# Patient Record
Sex: Female | Born: 1955 | Race: White | Hispanic: No | Marital: Single | Smoking: Never smoker
Health system: Southern US, Community
[De-identification: ages and names within clinical notes are randomized; demographics above are authoritative.]

## PROBLEM LIST (undated history)

## (undated) DIAGNOSIS — Z923 Personal history of irradiation: Secondary | ICD-10-CM

## (undated) DIAGNOSIS — T7840XA Allergy, unspecified, initial encounter: Secondary | ICD-10-CM

## (undated) DIAGNOSIS — E039 Hypothyroidism, unspecified: Secondary | ICD-10-CM

## (undated) DIAGNOSIS — T8859XA Other complications of anesthesia, initial encounter: Secondary | ICD-10-CM

## (undated) DIAGNOSIS — T4145XA Adverse effect of unspecified anesthetic, initial encounter: Secondary | ICD-10-CM

## (undated) DIAGNOSIS — N63 Unspecified lump in unspecified breast: Secondary | ICD-10-CM

## (undated) DIAGNOSIS — C50919 Malignant neoplasm of unspecified site of unspecified female breast: Secondary | ICD-10-CM

## (undated) DIAGNOSIS — E079 Disorder of thyroid, unspecified: Secondary | ICD-10-CM

## (undated) DIAGNOSIS — Z9889 Other specified postprocedural states: Secondary | ICD-10-CM

## (undated) DIAGNOSIS — F32A Depression, unspecified: Secondary | ICD-10-CM

## (undated) DIAGNOSIS — R112 Nausea with vomiting, unspecified: Secondary | ICD-10-CM

## (undated) DIAGNOSIS — F329 Major depressive disorder, single episode, unspecified: Secondary | ICD-10-CM

## (undated) DIAGNOSIS — F419 Anxiety disorder, unspecified: Secondary | ICD-10-CM

## (undated) HISTORY — DX: Depression, unspecified: F32.A

## (undated) HISTORY — DX: Allergy, unspecified, initial encounter: T78.40XA

## (undated) HISTORY — DX: Disorder of thyroid, unspecified: E07.9

## (undated) HISTORY — DX: Major depressive disorder, single episode, unspecified: F32.9

## (undated) HISTORY — PX: FRACTURE SURGERY: SHX138

---

## 1983-06-30 HISTORY — PX: OTHER SURGICAL HISTORY: SHX169

## 1998-12-03 ENCOUNTER — Other Ambulatory Visit: Admission: RE | Admit: 1998-12-03 | Discharge: 1998-12-03 | Payer: Self-pay | Admitting: Family Medicine

## 1999-04-07 ENCOUNTER — Other Ambulatory Visit: Admission: RE | Admit: 1999-04-07 | Discharge: 1999-04-07 | Payer: Self-pay | Admitting: *Deleted

## 1999-09-26 ENCOUNTER — Encounter: Payer: Self-pay | Admitting: Surgery

## 1999-09-29 ENCOUNTER — Encounter (INDEPENDENT_AMBULATORY_CARE_PROVIDER_SITE_OTHER): Payer: Self-pay

## 1999-09-29 ENCOUNTER — Observation Stay (HOSPITAL_COMMUNITY): Admission: RE | Admit: 1999-09-29 | Discharge: 1999-09-30 | Payer: Self-pay | Admitting: Surgery

## 2000-12-06 ENCOUNTER — Encounter: Payer: Self-pay | Admitting: Emergency Medicine

## 2000-12-06 ENCOUNTER — Emergency Department (HOSPITAL_COMMUNITY): Admission: EM | Admit: 2000-12-06 | Discharge: 2000-12-06 | Payer: Self-pay | Admitting: Emergency Medicine

## 2001-01-25 ENCOUNTER — Other Ambulatory Visit: Admission: RE | Admit: 2001-01-25 | Discharge: 2001-01-25 | Payer: Self-pay | Admitting: Obstetrics and Gynecology

## 2002-01-30 ENCOUNTER — Other Ambulatory Visit: Admission: RE | Admit: 2002-01-30 | Discharge: 2002-01-30 | Payer: Self-pay | Admitting: Obstetrics and Gynecology

## 2002-02-15 ENCOUNTER — Encounter: Payer: Self-pay | Admitting: Obstetrics and Gynecology

## 2002-02-15 ENCOUNTER — Ambulatory Visit (HOSPITAL_COMMUNITY): Admission: RE | Admit: 2002-02-15 | Discharge: 2002-02-15 | Payer: Self-pay | Admitting: Obstetrics and Gynecology

## 2003-03-01 ENCOUNTER — Other Ambulatory Visit: Admission: RE | Admit: 2003-03-01 | Discharge: 2003-03-01 | Payer: Self-pay | Admitting: Gynecology

## 2004-03-07 ENCOUNTER — Other Ambulatory Visit: Admission: RE | Admit: 2004-03-07 | Discharge: 2004-03-07 | Payer: Self-pay | Admitting: Gynecology

## 2005-04-22 ENCOUNTER — Other Ambulatory Visit: Admission: RE | Admit: 2005-04-22 | Discharge: 2005-04-22 | Payer: Self-pay | Admitting: Gynecology

## 2006-04-23 ENCOUNTER — Other Ambulatory Visit: Admission: RE | Admit: 2006-04-23 | Discharge: 2006-04-23 | Payer: Self-pay | Admitting: Gynecology

## 2006-11-18 ENCOUNTER — Encounter: Admission: RE | Admit: 2006-11-18 | Discharge: 2006-11-18 | Payer: Self-pay | Admitting: Family Medicine

## 2007-05-19 ENCOUNTER — Other Ambulatory Visit: Admission: RE | Admit: 2007-05-19 | Discharge: 2007-05-19 | Payer: Self-pay | Admitting: Gynecology

## 2008-10-12 ENCOUNTER — Encounter: Payer: Self-pay | Admitting: Women's Health

## 2008-10-12 ENCOUNTER — Other Ambulatory Visit: Admission: RE | Admit: 2008-10-12 | Discharge: 2008-10-12 | Payer: Self-pay | Admitting: Gynecology

## 2008-10-12 ENCOUNTER — Ambulatory Visit: Payer: Self-pay | Admitting: Women's Health

## 2009-11-08 ENCOUNTER — Ambulatory Visit: Payer: Self-pay | Admitting: Women's Health

## 2009-11-08 ENCOUNTER — Other Ambulatory Visit: Admission: RE | Admit: 2009-11-08 | Discharge: 2009-11-08 | Payer: Self-pay | Admitting: Gynecology

## 2012-07-01 ENCOUNTER — Encounter: Payer: Self-pay | Admitting: Women's Health

## 2012-07-01 ENCOUNTER — Other Ambulatory Visit (HOSPITAL_COMMUNITY)
Admission: RE | Admit: 2012-07-01 | Discharge: 2012-07-01 | Disposition: A | Discharge status: Home / Self Care | Payer: Managed Care, Other (non HMO) | Source: Ambulatory Visit | Attending: Women's Health | Admitting: Women's Health

## 2012-07-01 ENCOUNTER — Ambulatory Visit (INDEPENDENT_AMBULATORY_CARE_PROVIDER_SITE_OTHER): Payer: Managed Care, Other (non HMO) | Admitting: Women's Health

## 2012-07-01 VITALS — BP 120/70 | Ht 64.5 in | Wt 134.0 lb

## 2012-07-01 DIAGNOSIS — Z01419 Encounter for gynecological examination (general) (routine) without abnormal findings: Secondary | ICD-10-CM | POA: Insufficient documentation

## 2012-07-01 DIAGNOSIS — Z1151 Encounter for screening for human papillomavirus (HPV): Secondary | ICD-10-CM | POA: Insufficient documentation

## 2012-07-01 DIAGNOSIS — E039 Hypothyroidism, unspecified: Secondary | ICD-10-CM | POA: Insufficient documentation

## 2012-07-01 DIAGNOSIS — Z780 Asymptomatic menopausal state: Secondary | ICD-10-CM

## 2012-07-01 NOTE — Patient Instructions (Addendum)
Vit d 2000 daily  Schedule mammogram   Health Recommendations for Postmenopausal Women Based on the Results of the Women's Health Initiative [facility]) and Other Studies The WHI is a major 15-year research program to address the most common causes of death, disability and poor quality of life in postmenopausal women. Some of these causes are heart disease, cancer, bone loss (osteoporosis) and others. Taking into account all of the findings from [facility] and other studies, here are bottom-line health recommendations for women: CARDIOVASCULAR DISEASE Heart Disease: A heart attack is a medical emergency. Know the signs and symptoms of a heart attack. Hormone therapy should not be used to prevent heart disease. In women with heart disease, hormone therapy should not be used to prevent further disease. Hormone therapy increases the risk of blood clots. Below are things women can do to reduce their risk for heart disease.   Do not smoke. If you smoke, quit. Women who smoke are 2 to 6 times more likely to suffer a heart attack than non-smoking women.  Aim for a healthy weight. Being overweight causes many preventable deaths. Eat a healthy and balanced diet and drink an adequate amount of liquids.  Get moving. Make a commitment to be more physically active. Aim for 30 minutes of activity on most, if not all days of the week.  Eat for heart health. Choose a diet that is low in saturated fat, trans fat, and cholesterol. Include whole grains, vegetables, and fruits. Read the labels on the food container before buying it.  Know your numbers. Ask your caregiver to check your blood pressure, cholesterol (total, HDL, LDL, triglycerides) and blood glucose. Work with your caregiver to improve any numbers that are not normal.  High blood pressure. Limit or stop your table salt intake (try salt substitute and food seasonings), avoid salty foods and drinks. Read the labels on the food container before buying it. Avoid  becoming overweight by eating well and exercising. STROKE  Stroke is a medical emergency. Stroke can be the result of a blood clot in the blood vessel in the brain or by a brain hemorrhage (bleeding). Know the signs and symptoms of a stroke. To lower the risk of developing a stroke:  Avoid fatty foods.  Quit smoking.  Control your diabetes, blood pressure, and irregular heart rate. THROMBOPHLIBITIS (BLOOD CLOT) OF THE LEG  Hormone treatment is a big cause of developing blood clots in the leg. Becoming overweight and leading a stationary lifestyle also may contribute to developing blood clots. Controlling your diet and exercising will help lower the risk of developing blood clots. CANCER SCREENING  Breast Cancer: Women should take steps to reduce their risk of breast cancer. This includes having regular mammograms, monthly self breast exams and regular breast exams by your caregiver. Have a mammogram every one to two years if you are 79 to 59 years old. Have a mammogram annually if you are 40 years old or older depending on your risk factors. Women who are high risk for breast cancer may need more frequent mammograms. There are tests available (testing the genes in your body) if you have family history of breast cancer called BRCA 1 and 2. These tests can help determine the risks of developing breast cancer.  Intestinal or Stomach Cancer: Women should talk to their caregiver about when to start screening, what tests and how often they should be done, and the benefits and risks of doing these tests. Tests to consider are a rectal exam, fecal occult  blood, sigmoidoscopy, colononoscoby, barium enema and upper GI series of the stomach. Depending on the age, you may want to get a medical and family history of colon cancer. Women who are high risk may need to be screened at an earlier age and more often.  Cervical Cancer: A Pap test of the cervix should be done every year and every 3 years when there has  been three straight years of a normal Pap test. Women with an abnormal Pap test should be screened more often or have a cervical biopsy depending on your caregiver's recommendation.  Uterine Cancer: If you have vaginal bleeding after you are in the menopause, it should be evaluated by your caregiver.  Ovarian cancer: There are no reliable tests available to screen for ovarian cancer at this time except for yearly pelvic exams.  Lung Cancer: Yearly chest X-rays can detect lung cancer and should be done on high risk women, such as cigarette smokers and women with chronic lung disease (emphysemia).  Skin Cancer: A complete body skin exam should be done at your yearly examination. Avoid overexposure to the sun and ultraviolet light lamps. Use a strong sun block cream when in the sun. All of these things are important in lowering the risk of skin cancer. MENOPAUSE Menopause Symptoms: Hormone therapy products are effective for treating symptoms associated with menopause:  Moderate to severe hot flashes.  Night sweats.  Mood swings.  Headaches.  Tiredness.  Loss of sex drive.  Insomnia.  Other symptoms. However, hormone therapy products carry serious risks, especially in older women. Women who use or are thinking about using estrogen or estrogen with progestin treatments should discuss that with their caregiver. Your caregiver will know if the benefits outweigh the risks. The Food and Drug Administration (FDA) has concluded that hormone therapy should be used only at the lowest doses and for the shortest amount of time to reach treatment goals. It is not known at what doses there may be less risk of serious side effects. There are other treatments such as herbal medication (not controlled or regulated by the FDA), group therapy, counseling and acupuncture that may be helpful. OSTEOPOROSIS Protecting Against Bone Loss and Preventing Fracture: If hormone therapy is used for prevention of bone  loss (osteoporosis), the risks for bone loss must outweigh the risk of the therapy. Women considering taking hormone therapy for bone loss should ask their health care providers about other medications (fosamax and boniva) that are considered safe and effective for preventing bone loss and bone fractures. To guard against bone loss or fractures, it is recommended that women should take at least 1000-1500 mg of calcium and 400-800 IU of vitamin D daily in divided doses. Smoking and excessive alcohol intake increases the risk of osteoporosis. Eat foods rich in calcium and vitamin D and do weight bearing exercises several times a week as your caregiver suggests. DIABETES Diabetes Melitus: Women with Type I or Type 2 diabetes should keep their diabetes in control with diet, exercise and medication. Avoid too many sweets, starchy and fatty foods. Being overweight can affect your diabetes. COGNITION AND MEMORY Cognition and Memory: Menopausal hormone therapy is not recommended for the prevention of cognitive disorders such as Alzheimer's disease or memory loss. WHI found that women treated with hormone therapy have a greater risk of developing dementia.  DEPRESSION  Depression may occur at any age, but is common in elderly women. The reasons may be because of physical, medical, social (loneliness), financial and/or economic problems and  needs. Becoming involved with church, volunteer or social groups, seeking treatment for any physical or medical problems is recommended. Also, look into getting professional advice for any economic or financial problems. ACCIDENTS  Accidents are common and can be serious in the elderly woman. Prepare your house to prevent accidents. Eliminate throw rugs, use hip protectors, place hand bars in the bath, shower and toilet areas. Avoid wearing high heel shoes and walking on wet, snowy and icy areas. Stop driving if you have vision, hearing problems or are unsteady with you movements  and reflexes. RHEUMATOID ARTHRITIS Rheumatoid arthritis causes pain, swelling and stiffness of your bone joints. It can limit many of your activities. Over-the-counter medications may help, but prescription medications may be necessary. Talk with your caregiver about this. Exercise (walking, water aerobics), good posture, using splints on painful joints, warm baths or applying warm compresses to stiff joints and cold compresses to painful joints may be helpful. Smoking and excessive drinking may worsen the symptoms of arthritis. Seek help from a physical therapist if the arthritis is becoming a problem with your daily activities. IMMUNIZATIONS  Several immunizations are important to have during your senior years, including:   Tetanus and a diptheria shot booster every 10 years.  Influenza every year before the flu season begins.  Pneumonia vaccine.  Shingles vaccine.  Others as indicated (example: H1N1 vaccine). Document Released: 08/07/2005 Document Revised: 09/07/2011 Document Reviewed: 04/02/2008 [facility] Patient Information 2013 [place], [place].

## 2012-07-01 NOTE — Addendum Note (Signed)
Addended by: Richardson Chiquito on: 07/01/2012 05:19 PM   Modules accepted: Orders

## 2012-07-01 NOTE — Progress Notes (Signed)
LOLITHA TORTORA [birth year] [id number]    History:    The patient presents for annual exam.  Amenorrheic x1 year. Had regular monthly cycles prior. Minimal hot flushes, had hot flushes 6 months prior to cycle stopping. Not sexually active for years. History of normal Paps. Last mammogram 2003, no colonoscopy due to cost. Hypothyroid on Levoxyl per primary care. History of cryo- in 1979, normal Paps after.  Past medical history, past surgical history, family history and social history were all reviewed and documented in the EPIC chart. 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   ROS:  A  ROS was performed and pertinent positives and negatives are included in the history.  Exam:  Filed Vitals:   07/01/12 1622  BP: 120/70    General appearance:  Normal Head/Neck:  Normal, without cervical or supraclavicular adenopathy. Thyroid:  Symmetrical, normal in size, without palpable masses or nodularity. Respiratory  Effort:  Normal  Auscultation:  Clear without wheezing or rhonchi Cardiovascular  Auscultation:  Regular rate, without rubs, murmurs or gallops  Edema/varicosities:  Not grossly evident Abdominal  Soft,nontender, without masses, guarding or rebound.  Liver/spleen:  No organomegaly noted  Hernia:  None appreciated  Skin  Inspection:  Grossly normal  Palpation:  Grossly normal Neurologic/psychiatric  Orientation:  Normal with appropriate conversation.  Mood/affect:  Normal  Genitourinary    Breasts: Examined lying and sitting.     Right: Without masses, retractions, discharge or axillary adenopathy.     Left: Without masses, retractions, discharge or axillary adenopathy.   Inguinal/mons:  Normal without inguinal adenopathy  External genitalia:  Normal  BUS/Urethra/Skene's glands:  Normal  Bladder:  Normal  Vagina:  Normal  Cervix:  Normal  Uterus:   normal in size, shape and contour.  Midline and mobile  Adnexa/parametria:     Rt: Without masses or  tenderness.   Lt: Without masses or tenderness.  Anus and perineum: Normal  Digital rectal exam: Normal sphincter tone without palpated masses or tenderness  Assessment/Plan:  57 y.o. S. WF G1 P0 for annual exam with no complaints.  Normal postmenopausal exam Hypothyroid labs and meds primary care  Plan: Pap, last normal Pap 2011, new screening guidelines reviewed. Instructed to call if any further bleeding. Denies need for HRT. Reviewed importance of scheduling annual mammogram, breast center number given. SBE's, continue regular exercise, calcium rich diet, vitamin D 2000 daily encouraged. DEXA, instructed to schedule. Home Hemoccult card given with instructions. Encouraged to check insurance coverage annually, which may change for colonoscopy.    Harrington Challenger [facility], 4:52 PM 07/01/2012

## 2013-07-03 ENCOUNTER — Ambulatory Visit (INDEPENDENT_AMBULATORY_CARE_PROVIDER_SITE_OTHER): Payer: PRIVATE HEALTH INSURANCE | Admitting: Internal Medicine

## 2013-07-03 ENCOUNTER — Other Ambulatory Visit: Payer: Self-pay | Admitting: Internal Medicine

## 2013-07-03 ENCOUNTER — Ambulatory Visit: Payer: PRIVATE HEALTH INSURANCE

## 2013-07-03 VITALS — BP 110/66 | HR 100 | Temp 97.8°F | Resp 18 | Ht 65.0 in | Wt 129.0 lb

## 2013-07-03 DIAGNOSIS — L03019 Cellulitis of unspecified finger: Secondary | ICD-10-CM

## 2013-07-03 DIAGNOSIS — J040 Acute laryngitis: Secondary | ICD-10-CM

## 2013-07-03 DIAGNOSIS — R059 Cough, unspecified: Secondary | ICD-10-CM

## 2013-07-03 DIAGNOSIS — R05 Cough: Secondary | ICD-10-CM

## 2013-07-03 DIAGNOSIS — R5383 Other fatigue: Secondary | ICD-10-CM

## 2013-07-03 DIAGNOSIS — L02519 Cutaneous abscess of unspecified hand: Secondary | ICD-10-CM

## 2013-07-03 DIAGNOSIS — R5381 Other malaise: Secondary | ICD-10-CM

## 2013-07-03 DIAGNOSIS — M7989 Other specified soft tissue disorders: Secondary | ICD-10-CM

## 2013-07-03 LAB — POCT CBC
GRANULOCYTE PERCENT: 51 % (ref 37–80)
HEMATOCRIT: 41.1 % (ref 37.7–47.9)
HEMOGLOBIN: 12.7 g/dL (ref 12.2–16.2)
Lymph, poc: 3 (ref 0.6–3.4)
MCH: 30.2 pg (ref 27–31.2)
MCHC: 30.9 g/dL — AB (ref 31.8–35.4)
MCV: 97.9 fL — AB (ref 80–97)
MID (cbc): 0.5 (ref 0–0.9)
MPV: 8 fL (ref 0–99.8)
PLATELET COUNT, POC: 286 10*3/uL (ref 142–424)
POC Granulocyte: 3.7 (ref 2–6.9)
POC LYMPH PERCENT: 41.5 %L (ref 10–50)
POC MID %: 7.5 %M (ref 0–12)
RBC: 4.2 M/uL (ref 4.04–5.48)
RDW, POC: 21.6 %
WBC: 7.3 10*3/uL (ref 4.6–10.2)

## 2013-07-03 MED ORDER — DOXYCYCLINE HYCLATE 100 MG PO TABS: 100.0000 mg | ORAL_TABLET | Freq: Two times a day (BID) | ORAL | Status: DC

## 2013-07-03 MED ORDER — PREDNISONE 20 MG PO TABS: ORAL_TABLET | ORAL | Status: DC
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
## 2013-07-04 LAB — COMPREHENSIVE METABOLIC PANEL
ALBUMIN: 4.2 g/dL (ref 3.5–5.2)
ALT: 9 U/L (ref 0–35)
AST: 28 U/L (ref 0–37)
Alkaline Phosphatase: 80 U/L (ref 39–117)
BUN: 16 mg/dL (ref 6–23)
CALCIUM: 8.5 mg/dL (ref 8.4–10.5)
CHLORIDE: 108 meq/L (ref 96–112)
CO2: 22 meq/L (ref 19–32)
CREATININE: 0.58 mg/dL (ref 0.50–1.10)
GLUCOSE: 84 mg/dL (ref 70–99)
POTASSIUM: 4.3 meq/L (ref 3.5–5.3)
Sodium: 142 mEq/L (ref 135–145)
Total Bilirubin: 0.4 mg/dL (ref 0.3–1.2)
Total Protein: 6.8 g/dL (ref 6.0–8.3)

## 2013-07-04 LAB — SEDIMENTATION RATE: Sed Rate: 12 mm/hr (ref 0–22)

## 2013-07-04 LAB — TSH: TSH: 6.014 u[IU]/mL — AB (ref 0.350–4.500)

## 2013-07-05 ENCOUNTER — Telehealth: Payer: Self-pay | Admitting: Radiology

## 2013-07-05 LAB — T4, FREE: FREE T4: 1.22 ng/dL (ref 0.80–1.80)

## 2013-07-06 LAB — WOUND CULTURE
GRAM STAIN: NONE SEEN
Gram Stain: NONE SEEN
Gram Stain: NONE SEEN
Organism ID, Bacteria: NO GROWTH

## 2013-07-07 ENCOUNTER — Ambulatory Visit (INDEPENDENT_AMBULATORY_CARE_PROVIDER_SITE_OTHER): Payer: PRIVATE HEALTH INSURANCE | Admitting: Internal Medicine

## 2013-07-07 ENCOUNTER — Encounter: Payer: Self-pay | Admitting: Internal Medicine

## 2013-07-07 VITALS — BP 122/68 | HR 102 | Temp 97.5°F | Resp 18 | Ht 65.0 in | Wt 128.0 lb

## 2013-07-07 DIAGNOSIS — J019 Acute sinusitis, unspecified: Secondary | ICD-10-CM

## 2013-07-07 MED ORDER — AMOXICILLIN 875 MG PO TABS: 875.0000 mg | ORAL_TABLET | Freq: Two times a day (BID) | ORAL | Status: DC

## 2013-07-07 NOTE — Progress Notes (Signed)
   Subjective:    Patient ID: Angela [name], female    DOB: [birth year], 58 y.o.   MRN: [id number] This chart was scribed for [name], MD by Anastasia Pall, ED Scribe. This patient was seen in room 03 and the patient's care was started at 6:30 PM.  Chief Complaint  Patient presents with  . Cough    follow-up    HPI Angela [name] is a 58 y.o. female with h/o hypothyroidism, who presents to the [facility] for a follow up.  She reports she has intermittent cough, with associated rhinorrhea, postnasal drip, sore throat, and fatigue, onset 1 month ago. She reports a lot of nasal discharge in the mornings. She denies the Doxycycline bothering her. She reports she has not yet finished taking her prescribed medication. She reports her left finger has started bothering her again. She denies any other associated symptoms.   PCP - Shirline Frees, MD  Patient Active Problem List   Diagnosis Date Noted  . Hypothyroid 07/01/2012    Review of Systems  Constitutional: Positive for fatigue. Negative for fever.  HENT: Positive for postnasal drip, rhinorrhea and sore throat.   Respiratory: Positive for cough. Negative for wheezing.       Objective:   Physical Exam  Nursing note and vitals reviewed. Constitutional: She is oriented to person, place, and time. She appears well-developed and well-nourished. No distress.  HENT:  Head: Normocephalic and atraumatic.  Right Ear: External ear normal.  Left Ear: External ear normal.  Purulent discharge from the right nares with tenderness in the right maxillary sinus The soft palate has multiple small shallow ulcers toward the posterior aspect that are tender No exudate  Eyes: EOM are normal.  Neck: Neck supple. No thyromegaly present.  Cardiovascular: Normal rate, regular rhythm and normal heart sounds.   No murmur heard. Pulmonary/Chest: Effort normal and breath sounds normal. No respiratory distress. She has no wheezes. She has no rales.    Musculoskeletal: Normal range of motion.  Lymphadenopathy:    She has no cervical adenopathy.  Neurological: She is alert and oriented to person, place, and time.  Skin: Skin is warm and dry.  Psychiatric: She has a normal mood and affect. Her behavior is normal.    BP 122/68  Pulse 102  Temp(Src) 97.5 F (36.4 C) (Oral)  Resp 18  Ht 5\' 5"  (1.651 m)  Wt 128 lb (58.06 kg)  BMI 21.30 kg/m2  SpO2 97%     Assessment & Plan:   1. Acute sinusitis, unspecified     Meds ordered this encounter  Medications  . amoxicillin (AMOXIL) 875 MG tablet    Sig: Take 1 tablet (875 mg total) by mouth 2 (two) times daily.    Dispense:  20 tablet    Refill:  0     I have completed the patient encounter in its entirety as documented by the scribe, with editing by me where necessary. Siyon Linck P. Laney Pastor, M.D.

## 2013-09-04 NOTE — Telephone Encounter (Signed)
error 

## 2013-11-08 ENCOUNTER — Encounter: Payer: PRIVATE HEALTH INSURANCE | Admitting: Internal Medicine

## 2015-06-29 ENCOUNTER — Ambulatory Visit (INDEPENDENT_AMBULATORY_CARE_PROVIDER_SITE_OTHER): Payer: 59 | Admitting: Internal Medicine

## 2015-06-29 VITALS — BP 122/80 | HR 93 | Temp 97.7°F | Resp 16 | Ht 65.0 in | Wt 129.0 lb

## 2015-06-29 DIAGNOSIS — IMO0001 Reserved for inherently not codable concepts without codable children: Secondary | ICD-10-CM

## 2015-06-29 DIAGNOSIS — M609 Myositis, unspecified: Secondary | ICD-10-CM | POA: Diagnosis not present

## 2015-06-29 DIAGNOSIS — Z1211 Encounter for screening for malignant neoplasm of colon: Secondary | ICD-10-CM | POA: Diagnosis not present

## 2015-06-29 DIAGNOSIS — M791 Myalgia: Secondary | ICD-10-CM

## 2015-06-29 DIAGNOSIS — R202 Paresthesia of skin: Secondary | ICD-10-CM | POA: Diagnosis not present

## 2015-06-29 DIAGNOSIS — Z0000 Encounter for general adult medical examination without abnormal findings: Secondary | ICD-10-CM | POA: Diagnosis not present

## 2015-06-29 LAB — LIPID PANEL
CHOL/HDL RATIO: 2.3 ratio (ref <=5.0)
Cholesterol: 197 mg/dL (ref 125–200)
HDL: 86 mg/dL (ref >=46)
LDL Cholesterol: 87 mg/dL (ref <130)
TRIGLYCERIDES: 118 mg/dL (ref <150)
VLDL: 24 mg/dL (ref <30)

## 2015-06-29 LAB — COMPREHENSIVE METABOLIC PANEL
ALBUMIN: 4.4 g/dL (ref 3.6–5.1)
ALT: 7 U/L (ref 6–29)
AST: 20 U/L (ref 10–35)
Alkaline Phosphatase: 73 U/L (ref 33–130)
BUN: 12 mg/dL (ref 7–25)
CHLORIDE: 101 mmol/L (ref 98–110)
CO2: 27 mmol/L (ref 20–31)
CREATININE: 0.68 mg/dL (ref 0.50–0.99)
Calcium: 8.8 mg/dL (ref 8.6–10.4)
Glucose, Bld: 88 mg/dL (ref 65–99)
POTASSIUM: 4.1 mmol/L (ref 3.5–5.3)
SODIUM: 135 mmol/L (ref 135–146)
TOTAL PROTEIN: 7.3 g/dL (ref 6.1–8.1)
Total Bilirubin: 0.6 mg/dL (ref 0.2–1.2)

## 2015-06-29 LAB — T4, FREE: FREE T4: 0.97 ng/dL (ref 0.80–1.80)

## 2015-06-29 LAB — CBC WITH DIFFERENTIAL/PLATELET
BASOS PCT: 0 % (ref 0–1)
Basophils Absolute: 0 10*3/uL (ref 0.0–0.1)
EOS ABS: 0.1 10*3/uL (ref 0.0–0.7)
EOS PCT: 1 % (ref 0–5)
HCT: 38.3 % (ref 36.0–46.0)
Hemoglobin: 13 g/dL (ref 12.0–15.0)
LYMPHS ABS: 2.6 10*3/uL (ref 0.7–4.0)
Lymphocytes Relative: 24 % (ref 12–46)
MCH: 30.2 pg (ref 26.0–34.0)
MCHC: 33.9 g/dL (ref 30.0–36.0)
MCV: 89.1 fL (ref 78.0–100.0)
MONO ABS: 1 10*3/uL (ref 0.1–1.0)
MONOS PCT: 9 % (ref 3–12)
MPV: 8.8 fL (ref 8.6–12.4)
Neutro Abs: 7.1 10*3/uL (ref 1.7–7.7)
Neutrophils Relative %: 66 % (ref 43–77)
PLATELETS: 255 10*3/uL (ref 150–400)
RBC: 4.3 MIL/uL (ref 3.87–5.11)
RDW: 12.8 % (ref 11.5–15.5)
WBC: 10.8 10*3/uL — ABNORMAL HIGH (ref 4.0–10.5)

## 2015-06-29 LAB — TSH: TSH: 5.012 u[IU]/mL — AB (ref 0.350–4.500)

## 2015-06-29 LAB — POCT SEDIMENTATION RATE: POCT SED RATE: 3 mm/hr (ref 0–22)

## 2015-06-29 NOTE — Progress Notes (Signed)
Subjective:    Patient ID: Angela [name], female    DOB: [birth year], 59 y.o.   MRN: [id number] By signing my name below, I, Judithe [name], attest that this documentation has been prepared under the direction and in the presence of Angela Lin, MD. Electronically Signed: Judithe [name], ER Scribe. 06/29/2015. 11:14 AM.  Chief Complaint  Patient presents with  . fibromialgia    x 3 months  . Annual Exam    complete physical    HPI HPI Comments: [name] is a 59 y.o. female who presents to [facility] reporting for an annual physical examination. She states she has been suffering from tingling in her hands, feet and legs. The tingling started in her hands, and progressed to numbness. Those sx then moved to her feet, legs and arms. She has also had severe cramping in her back and legs, that seems to happen when she stretches.   Hx thyr repl after partial thyroidec for nodule. She has not taken her thyroid medication in two years.  Sis w/ hashimotos  Patient Active Problem List   Diagnosis Date Noted  . Hypothyroid 07/01/2012   Past Medical History  Diagnosis Date  . Thyroid disease     hypothyroid  . Depression   . Allergy     Allergies  Allergen Reactions  . Morphine And Related    Declines all vaccines PAP and Mammo UTD per her Gyn-FNP  Review of Systems  Constitutional: Positive for diaphoresis and fatigue. Negative for fever and chills.  HENT: Positive for ear pain, sneezing, sore throat and tinnitus.   Eyes: Positive for photophobia and visual disturbance.  Respiratory: Positive for shortness of breath.   Cardiovascular: Positive for chest pain and palpitations.  Gastrointestinal: Positive for diarrhea.  Endocrine: Positive for cold intolerance, polydipsia and polyphagia.  Musculoskeletal: Positive for myalgias, back pain, arthralgias and neck stiffness.  Allergic/Immunologic: Positive for environmental allergies.  Neurological: Positive for speech  difficulty, weakness, light-headedness and numbness.       Tingling in hands and feet.  Hematological: Bruises/bleeds easily.  Psychiatric/Behavioral: Positive for confusion, sleep disturbance, decreased concentration and agitation. The patient is nervous/anxious and is hyperactive.        Objective:  BP 122/80 mmHg  Pulse 93  Temp(Src) 97.7 F (36.5 C) (Oral)  Resp 16  Ht 5' 5"  (1.651 m)  Wt 129 lb (58.514 kg)  BMI 21.47 kg/m2  SpO2 98%  Physical Exam  Constitutional: She is oriented to person, place, and time. She appears well-developed and well-nourished. No distress.  HENT:  Head: Normocephalic and atraumatic.  Right Ear: External ear normal.  Left Ear: External ear normal.  Nose: Nose normal.  Mouth/Throat: Oropharynx is clear and moist.  Eyes: Conjunctivae and EOM are normal. Pupils are equal, round, and reactive to light.  Neck: Normal range of motion. Neck supple. No thyromegaly present.  Cardiovascular: Normal rate, regular rhythm, normal heart sounds and intact distal pulses.   No murmur heard. Pulmonary/Chest: Effort normal and breath sounds normal. No respiratory distress. She has no wheezes.  Abdominal: Soft. Bowel sounds are normal. She exhibits no distension and no mass. There is no tenderness. There is no rebound.  Musculoskeletal: Normal range of motion. She exhibits no edema or tenderness.  Lymphadenopathy:    She has no cervical adenopathy.  Neurological: She is alert and oriented to person, place, and time. She has normal reflexes. No cranial nerve deficit. Coordination normal.  Skin: Skin is warm and dry.  No rash noted. She is not diaphoretic.  Psychiatric: She has a normal mood and affect. Her behavior is normal. Judgment and thought content normal.  Nursing note and vitals reviewed.     Assessment & Plan:  Annual physical exam - Plan: CBC with Differential/Platelet, Comprehensive metabolic panel, Lipid panel  Paresthesia - Plan: TSH, T4,  free  Myalgia and myositis - Plan: POCT SEDIMENTATION RATE  Special screening for malignant neoplasms, colon - Plan: Ambulatory referral to Gastroenterology  She has had significant improvement of most of her symptoms over the past year as she has adopted a more integrative medicine approach to her problems  I have completed the patient encounter in its entirety as documented by the scribe, with editing by me where necessary. Jahmai Finelli P. Laney Pastor, M.D.  Addendum labs 07/01/15 Results for orders placed or performed in visit on 06/29/15  CBC with Differential/Platelet  Result Value Ref Range   WBC 10.8 (H) 4.0 - 10.5 K/uL   RBC 4.30 3.87 - 5.11 MIL/uL   Hemoglobin 13.0 12.0 - 15.0 g/dL   HCT 38.3 36.0 - 46.0 %   MCV 89.1 78.0 - 100.0 fL   MCH 30.2 26.0 - 34.0 pg   MCHC 33.9 30.0 - 36.0 g/dL   RDW 12.8 11.5 - 15.5 %   Platelets 255 150 - 400 K/uL   MPV 8.8 8.6 - 12.4 fL   Neutrophils Relative % 66 43 - 77 %   Neutro Abs 7.1 1.7 - 7.7 K/uL   Lymphocytes Relative 24 12 - 46 %   Lymphs Abs 2.6 0.7 - 4.0 K/uL   Monocytes Relative 9 3 - 12 %   Monocytes Absolute 1.0 0.1 - 1.0 K/uL   Eosinophils Relative 1 0 - 5 %   Eosinophils Absolute 0.1 0.0 - 0.7 K/uL   Basophils Relative 0 0 - 1 %   Basophils Absolute 0.0 0.0 - 0.1 K/uL   Smear Review Criteria for review not met   Comprehensive metabolic panel  Result Value Ref Range   Sodium 135 135 - 146 mmol/L   Potassium 4.1 3.5 - 5.3 mmol/L   Chloride 101 98 - 110 mmol/L   CO2 27 20 - 31 mmol/L   Glucose, Bld 88 65 - 99 mg/dL   BUN 12 7 - 25 mg/dL   Creat 0.68 0.50 - 0.99 mg/dL   Total Bilirubin 0.6 0.2 - 1.2 mg/dL   Alkaline Phosphatase 73 33 - 130 U/L   AST 20 10 - 35 U/L   ALT 7 6 - 29 U/L   Total Protein 7.3 6.1 - 8.1 g/dL   Albumin 4.4 3.6 - 5.1 g/dL   Calcium 8.8 8.6 - 10.4 mg/dL  TSH  Result Value Ref Range   TSH 5.012 (H) 0.350 - 4.500 uIU/mL  T4, free  Result Value Ref Range   Free T4 0.97 0.80 - 1.80 ng/dL  Lipid  panel  Result Value Ref Range   Cholesterol 197 125 - 200 mg/dL   Triglycerides 118 <150 mg/dL   HDL 86 >=46 mg/dL   Total CHOL/HDL Ratio 2.3 <=5.0 Ratio   VLDL 24 <30 mg/dL   LDL Cholesterol 87 <130 mg/dL  POCT SEDIMENTATION RATE  Result Value Ref Range   POCT SED RATE 3 0 - 22 mm/hr   Will start armour thyr at her request --small dose to see if alters fatigue/coldness etc

## 2015-07-01 ENCOUNTER — Encounter: Payer: Self-pay | Admitting: Internal Medicine

## 2015-07-01 MED ORDER — THYROID 15 MG PO TABS: 15.0000 mg | ORAL_TABLET | Freq: Every day | ORAL | Status: DC

## 2015-08-05 ENCOUNTER — Encounter: Payer: Self-pay | Admitting: Family Medicine

## 2015-08-07 DIAGNOSIS — N63 Unspecified lump in unspecified breast: Secondary | ICD-10-CM

## 2015-08-07 HISTORY — DX: Unspecified lump in unspecified breast: N63.0

## 2016-06-16 ENCOUNTER — Encounter: Payer: Managed Care, Other (non HMO) | Admitting: Physician Assistant

## 2016-06-19 ENCOUNTER — Ambulatory Visit (INDEPENDENT_AMBULATORY_CARE_PROVIDER_SITE_OTHER): Payer: BLUE CROSS/BLUE SHIELD | Admitting: Physician Assistant

## 2016-06-19 VITALS — BP 136/64 | HR 94 | Temp 97.7°F | Ht 65.0 in | Wt 127.4 lb

## 2016-06-19 DIAGNOSIS — Z0000 Encounter for general adult medical examination without abnormal findings: Secondary | ICD-10-CM | POA: Diagnosis not present

## 2016-06-19 DIAGNOSIS — Z8249 Family history of ischemic heart disease and other diseases of the circulatory system: Secondary | ICD-10-CM | POA: Diagnosis not present

## 2016-06-19 DIAGNOSIS — Z130 Encounter for screening for diseases of the blood and blood-forming organs and certain disorders involving the immune mechanism: Secondary | ICD-10-CM

## 2016-06-19 DIAGNOSIS — E039 Hypothyroidism, unspecified: Secondary | ICD-10-CM

## 2016-06-19 DIAGNOSIS — Z803 Family history of malignant neoplasm of breast: Secondary | ICD-10-CM | POA: Diagnosis not present

## 2016-06-19 DIAGNOSIS — Z114 Encounter for screening for human immunodeficiency virus [HIV]: Secondary | ICD-10-CM | POA: Diagnosis not present

## 2016-06-19 DIAGNOSIS — Z1159 Encounter for screening for other viral diseases: Secondary | ICD-10-CM

## 2016-06-19 DIAGNOSIS — Z13228 Encounter for screening for other metabolic disorders: Secondary | ICD-10-CM

## 2016-06-19 DIAGNOSIS — N63 Unspecified lump in unspecified breast: Secondary | ICD-10-CM

## 2016-06-19 DIAGNOSIS — Z1211 Encounter for screening for malignant neoplasm of colon: Secondary | ICD-10-CM | POA: Diagnosis not present

## 2016-06-19 DIAGNOSIS — Z13220 Encounter for screening for lipoid disorders: Secondary | ICD-10-CM

## 2016-06-19 NOTE — Addendum Note (Signed)
Addended by: Mancel Bale on: 06/19/2016 08:10 PM   Modules accepted: Orders
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
## 2016-06-19 NOTE — Patient Instructions (Addendum)
Health Maintenance, Female Introduction Adopting a healthy lifestyle and getting preventive care can go a long way to promote health and wellness. Talk with your health care provider about what schedule of regular examinations is right for you. This is a good chance for you to check in with your provider about disease prevention and staying healthy. In between checkups, there are plenty of things you can do on your own. Experts have done a lot of research about which lifestyle changes and preventive measures are most likely to keep you healthy. Ask your health care provider for more information. Weight and diet Eat a healthy diet  Be sure to include plenty of vegetables, fruits, low-fat dairy products, and lean protein.  Do not eat a lot of foods high in solid fats, added sugars, or salt.  Get regular exercise. This is one of the most important things you can do for your health.  Most adults should exercise for at least 150 minutes each week. The exercise should increase your heart rate and make you sweat (moderate-intensity exercise).  Most adults should also do strengthening exercises at least twice a week. This is in addition to the moderate-intensity exercise. Maintain a healthy weight  Body mass index (BMI) is a measurement that can be used to identify possible weight problems. It estimates body fat based on height and weight. Your health care provider can help determine your BMI and help you achieve or maintain a healthy weight.  For females 4 years of age and older:  A BMI below 18.5 is considered underweight.  A BMI of 18.5 to 24.9 is normal.  A BMI of 25 to 29.9 is considered overweight.  A BMI of 30 and above is considered obese. Watch levels of cholesterol and blood lipids  You should start having your blood tested for lipids and cholesterol at 62 years of age, then have this test every 5 years.  You may need to have your cholesterol levels checked more often  if:  Your lipid or cholesterol levels are high.  You are older than 62 years of age.  You are at high risk for heart disease. Cancer screening Lung Cancer  Lung cancer screening is recommended for adults 12-31 years old who are at high risk for lung cancer because of a history of smoking.  A yearly low-dose CT scan of the lungs is recommended for people who:  Currently smoke.  Have quit within the past 15 years.  Have at least a 30-pack-year history of smoking. A pack year is smoking an average of one pack of cigarettes a day for 1 year.  Yearly screening should continue until it has been 15 years since you quit.  Yearly screening should stop if you develop a health problem that would prevent you from having lung cancer treatment. Breast Cancer  Practice breast self-awareness. This means understanding how your breasts normally appear and feel.  It also means doing regular breast self-exams. Let your health care provider know about any changes, no matter how small.  If you are in your 20s or 30s, you should have a clinical breast exam (CBE) by a health care provider every 1-3 years as part of a regular health exam.  If you are 74 or older, have a CBE every year. Also consider having a breast X-ray (mammogram) every year.  If you have a family history of breast cancer, talk to your health care provider about genetic screening.  If you are at high risk for breast cancer,  talk to your health care provider about having an MRI and a mammogram every year.  Breast cancer gene (BRCA) assessment is recommended for women who have family members with BRCA-related cancers. BRCA-related cancers include:  Breast.  Ovarian.  Tubal.  Peritoneal cancers.  Results of the assessment will determine the need for genetic counseling and BRCA1 and BRCA2 testing. Cervical Cancer  Your health care provider may recommend that you be screened regularly for cancer of the pelvic organs (ovaries,  uterus, and vagina). This screening involves a pelvic examination, including checking for microscopic changes to the surface of your cervix (Pap test). You may be encouraged to have this screening done every 3 years, beginning at age 21.  For women ages 30-65, health care providers may recommend pelvic exams and Pap testing every 3 years, or they may recommend the Pap and pelvic exam, combined with testing for human papilloma virus (HPV), every 5 years. Some types of HPV increase your risk of cervical cancer. Testing for HPV may also be done on women of any age with unclear Pap test results.  Other health care providers may not recommend any screening for nonpregnant women who are considered low risk for pelvic cancer and who do not have symptoms. Ask your health care provider if a screening pelvic exam is right for you.  If you have had past treatment for cervical cancer or a condition that could lead to cancer, you need Pap tests and screening for cancer for at least 20 years after your treatment. If Pap tests have been discontinued, your risk factors (such as having a new sexual partner) need to be reassessed to determine if screening should resume. Some women have medical problems that increase the chance of getting cervical cancer. In these cases, your health care provider may recommend more frequent screening and Pap tests. Colorectal Cancer  This type of cancer can be detected and often prevented.  Routine colorectal cancer screening usually begins at 62 years of age and continues through 62 years of age.  Your health care provider may recommend screening at an earlier age if you have risk factors for colon cancer.  Your health care provider may also recommend using home test kits to check for hidden blood in the stool.  A small camera at the end of a tube can be used to examine your colon directly (sigmoidoscopy or colonoscopy). This is done to check for the earliest forms of colorectal  cancer.  Routine screening usually begins at age 50.  Direct examination of the colon should be repeated every 5-10 years through 62 years of age. However, you may need to be screened more often if early forms of precancerous polyps or small growths are found. Skin Cancer  Check your skin from head to toe regularly.  Tell your health care provider about any new moles or changes in moles, especially if there is a change in a mole's shape or color.  Also tell your health care provider if you have a mole that is larger than the size of a pencil eraser.  Always use sunscreen. Apply sunscreen liberally and repeatedly throughout the day.  Protect yourself by wearing long sleeves, pants, a wide-brimmed hat, and sunglasses whenever you are outside. Heart disease, diabetes, and high blood pressure  High blood pressure causes heart disease and increases the risk of stroke. High blood pressure is more likely to develop in:  People who have blood pressure in the high end of the normal range (130-139/85-89 mm Hg).    People who are overweight or obese.  People who are African American.  If you are 18-39 years of age, have your blood pressure checked every 3-5 years. If you are 40 years of age or older, have your blood pressure checked every year. You should have your blood pressure measured twice-once when you are at a hospital or clinic, and once when you are not at a hospital or clinic. Record the average of the two measurements. To check your blood pressure when you are not at a hospital or clinic, you can use:  An automated blood pressure machine at a pharmacy.  A home blood pressure monitor.  If you are between 55 years and 79 years old, ask your health care provider if you should take aspirin to prevent strokes.  Have regular diabetes screenings. This involves taking a blood sample to check your fasting blood sugar level.  If you are at a normal weight and have a low risk for diabetes,  have this test once every three years after 62 years of age.  If you are overweight and have a high risk for diabetes, consider being tested at a younger age or more often. Preventing infection Hepatitis B  If you have a higher risk for hepatitis B, you should be screened for this virus. You are considered at high risk for hepatitis B if:  You were born in a country where hepatitis B is common. Ask your health care provider which countries are considered high risk.  Your parents were born in a high-risk country, and you have not been immunized against hepatitis B (hepatitis B vaccine).  You have HIV or AIDS.  You use needles to inject street drugs.  You live with someone who has hepatitis B.  You have had sex with someone who has hepatitis B.  You get hemodialysis treatment.  You take certain medicines for conditions, including cancer, organ transplantation, and autoimmune conditions. Hepatitis C  Blood testing is recommended for:  Everyone born from 1945 through 1965.  Anyone with known risk factors for hepatitis C. Sexually transmitted infections (STIs)  You should be screened for sexually transmitted infections (STIs) including gonorrhea and chlamydia if:  You are sexually active and are younger than 62 years of age.  You are older than 62 years of age and your health care provider tells you that you are at risk for this type of infection.  Your sexual activity has changed since you were last screened and you are at an increased risk for chlamydia or gonorrhea. Ask your health care provider if you are at risk.  If you do not have HIV, but are at risk, it may be recommended that you take a prescription medicine daily to prevent HIV infection. This is called pre-exposure prophylaxis (PrEP). You are considered at risk if:  You are sexually active and do not regularly use condoms or know the HIV status of your partner(s).  You take drugs by injection.  You are sexually  active with a partner who has HIV. Talk with your health care provider about whether you are at high risk of being infected with HIV. If you choose to begin PrEP, you should first be tested for HIV. You should then be tested every 3 months for as long as you are taking PrEP. Pregnancy  If you are premenopausal and you may become pregnant, ask your health care provider about preconception counseling.  If you may become pregnant, take 400 to 800 micrograms (mcg) of folic acid   every day.  If you want to prevent pregnancy, talk to your health care provider about birth control (contraception). Osteoporosis and menopause  Osteoporosis is a disease in which the bones lose minerals and strength with aging. This can result in serious bone fractures. Your risk for osteoporosis can be identified using a bone density scan.  If you are 65 years of age or older, or if you are at risk for osteoporosis and fractures, ask your health care provider if you should be screened.  Ask your health care provider whether you should take a calcium or vitamin D supplement to lower your risk for osteoporosis.  Menopause may have certain physical symptoms and risks.  Hormone replacement therapy may reduce some of these symptoms and risks. Talk to your health care provider about whether hormone replacement therapy is right for you. Follow these instructions at home:  Schedule regular health, dental, and eye exams.  Stay current with your immunizations.  Do not use any tobacco products including cigarettes, chewing tobacco, or electronic cigarettes.  If you are pregnant, do not drink alcohol.  If you are breastfeeding, limit how much and how often you drink alcohol.  Limit alcohol intake to no more than 1 drink per day for nonpregnant women. One drink equals 12 ounces of beer, 5 ounces of wine, or 1 ounces of hard liquor.  Do not use street drugs.  Do not share needles.  Ask your health care provider for  help if you need support or information about quitting drugs.  Tell your health care provider if you often feel depressed.  Tell your health care provider if you have ever been abused or do not feel safe at home. This information is not intended to replace advice given to you by your health care provider. Make sure you discuss any questions you have with your health care provider. Document Released: 12/29/2010 Document Revised: 11/21/2015 Document Reviewed: 03/19/2015  2017 [employer]vier    IF you received an x-ray today, you will receive an invoice from [facility]. Please contact  Radiology at [phone] with questions or concerns regarding your invoice.   IF you received labwork today, you will receive an invoice from [facility]. Please contact [facility] at [phone] with questions or concerns regarding your invoice.   Our billing staff will not be able to assist you with questions regarding bills from these companies.  You will be contacted with the lab results as soon as they are available. The fastest way to get your results is to activate your My Chart account. Instructions are located on the last page of this paperwork. If you have not heard from us regarding the results in 2 weeks, please contact this office.      

## 2016-06-20 LAB — SPECIMEN STATUS REPORT

## 2016-06-23 LAB — LIPID PANEL
CHOLESTEROL TOTAL: 229 mg/dL — AB (ref 100–199)
Chol/HDL Ratio: 2.4 ratio units (ref 0.0–4.4)
HDL: 96 mg/dL (ref >39)
LDL CALC: 114 mg/dL — AB (ref 0–99)
TRIGLYCERIDES: 96 mg/dL (ref 0–149)
VLDL Cholesterol Cal: 19 mg/dL (ref 5–40)

## 2016-06-23 LAB — CBC WITH DIFFERENTIAL/PLATELET
BASOS ABS: 0 10*3/uL (ref 0.0–0.2)
Basos: 1 %
EOS (ABSOLUTE): 0.1 10*3/uL (ref 0.0–0.4)
Eos: 1 %
HEMATOCRIT: 38 % (ref 34.0–46.6)
Hemoglobin: 13.1 g/dL (ref 11.1–15.9)
Immature Grans (Abs): 0 10*3/uL (ref 0.0–0.1)
Immature Granulocytes: 0 %
LYMPHS ABS: 2.4 10*3/uL (ref 0.7–3.1)
Lymphs: 30 %
MCH: 30.3 pg (ref 26.6–33.0)
MCHC: 34.5 g/dL (ref 31.5–35.7)
MCV: 88 fL (ref 79–97)
MONOS ABS: 0.7 10*3/uL (ref 0.1–0.9)
Monocytes: 9 %
Neutrophils Absolute: 4.8 10*3/uL (ref 1.4–7.0)
Neutrophils: 59 %
Platelets: 264 10*3/uL (ref 150–379)
RBC: 4.32 x10E6/uL (ref 3.77–5.28)
RDW: 12.3 % (ref 12.3–15.4)
WBC: 8 10*3/uL (ref 3.4–10.8)

## 2016-06-23 LAB — CMP14+EGFR
ALK PHOS: 82 IU/L (ref 39–117)
ALT: 10 IU/L (ref 0–32)
AST: 26 IU/L (ref 0–40)
Albumin/Globulin Ratio: 2 (ref 1.2–2.2)
Albumin: 4.9 g/dL — ABNORMAL HIGH (ref 3.6–4.8)
BILIRUBIN TOTAL: 0.5 mg/dL (ref 0.0–1.2)
BUN/Creatinine Ratio: 23 (ref 12–28)
BUN: 17 mg/dL (ref 8–27)
CHLORIDE: 99 mmol/L (ref 96–106)
CO2: 25 mmol/L (ref 18–29)
CREATININE: 0.73 mg/dL (ref 0.57–1.00)
Calcium: 9.3 mg/dL (ref 8.7–10.3)
GFR calc Af Amer: 102 mL/min/{1.73_m2} (ref >59)
GFR calc non Af Amer: 89 mL/min/{1.73_m2} (ref >59)
GLOBULIN, TOTAL: 2.4 g/dL (ref 1.5–4.5)
GLUCOSE: 93 mg/dL (ref 65–99)
POTASSIUM: 4.2 mmol/L (ref 3.5–5.2)
SODIUM: 140 mmol/L (ref 134–144)
Total Protein: 7.3 g/dL (ref 6.0–8.5)

## 2016-06-23 LAB — HIV ANTIBODY (ROUTINE TESTING W REFLEX): HIV SCREEN 4TH GENERATION: NONREACTIVE

## 2016-06-23 LAB — HCV AB W REFLEX TO QUANT PCR: HCV AB: 0.1 {s_co_ratio} (ref 0.0–0.9)

## 2016-06-23 LAB — T4, FREE: Free T4: 1.09 ng/dL (ref 0.82–1.77)

## 2016-06-23 LAB — HCV INTERPRETATION

## 2016-06-23 LAB — TSH: TSH: 4.88 u[IU]/mL — ABNORMAL HIGH (ref 0.450–4.500)

## 2016-06-24 ENCOUNTER — Encounter: Payer: Self-pay | Admitting: *Deleted

## 2016-06-29 HISTORY — PX: BREAST LUMPECTOMY: SHX2

## 2016-07-27 ENCOUNTER — Other Ambulatory Visit: Payer: Self-pay

## 2016-07-27 MED ORDER — THYROID 15 MG PO TABS
15.0000 mg | ORAL_TABLET | Freq: Every day | ORAL | 1 refills | Status: DC
Start: 2016-07-27 — End: 2017-06-28

## 2016-07-27 NOTE — Telephone Encounter (Signed)
05/2016 last ov and  tsh/labs "normal"

## 2016-08-06 ENCOUNTER — Ambulatory Visit
Admission: RE | Admit: 2016-08-06 | Discharge: 2016-08-06 | Disposition: A | Discharge status: Home / Self Care | Payer: BLUE CROSS/BLUE SHIELD | Source: Ambulatory Visit | Attending: Physician Assistant | Admitting: Physician Assistant

## 2016-08-06 ENCOUNTER — Other Ambulatory Visit: Payer: Self-pay | Admitting: Physician Assistant

## 2016-08-06 DIAGNOSIS — Z803 Family history of malignant neoplasm of breast: Secondary | ICD-10-CM

## 2016-08-06 DIAGNOSIS — C50412 Malignant neoplasm of upper-outer quadrant of left female breast: Secondary | ICD-10-CM | POA: Diagnosis not present

## 2016-08-06 DIAGNOSIS — N63 Unspecified lump in unspecified breast: Secondary | ICD-10-CM

## 2016-08-06 DIAGNOSIS — N6321 Unspecified lump in the left breast, upper outer quadrant: Secondary | ICD-10-CM | POA: Diagnosis not present

## 2016-08-06 DIAGNOSIS — R921 Mammographic calcification found on diagnostic imaging of breast: Secondary | ICD-10-CM

## 2016-08-06 DIAGNOSIS — C50919 Malignant neoplasm of unspecified site of unspecified female breast: Secondary | ICD-10-CM

## 2016-08-06 DIAGNOSIS — R590 Localized enlarged lymph nodes: Secondary | ICD-10-CM | POA: Diagnosis not present

## 2016-08-06 DIAGNOSIS — Z170 Estrogen receptor positive status [ER+]: Secondary | ICD-10-CM | POA: Diagnosis not present

## 2016-08-06 HISTORY — DX: Unspecified lump in unspecified breast: N63.0

## 2016-08-06 HISTORY — DX: Malignant neoplasm of unspecified site of unspecified female breast: C50.919

## 2016-08-13 DIAGNOSIS — C50912 Malignant neoplasm of unspecified site of left female breast: Secondary | ICD-10-CM | POA: Diagnosis not present

## 2016-08-17 ENCOUNTER — Telehealth: Payer: Self-pay | Admitting: Hematology

## 2016-08-17 ENCOUNTER — Encounter: Payer: Self-pay | Admitting: Radiation Oncology

## 2016-08-17 NOTE — Telephone Encounter (Signed)
Lft the pt a vm for an appt w/ Dr. Burr Medico on 2/22 at 11am.

## 2016-08-17 NOTE — Progress Notes (Signed)
Location of Breast Cancer: Left Breast Upper Outer Quadrant  Histology per Pathology Report: Diagnosis 08/06/2016: Dr. Excell Seltzer, MD 1. Breast, left, needle core biopsy, 1:00 o'clock - INVASIVE DUCTAL CARCINOMA WITH PAPILLARY FEATURES. - SEE COMMENT. 2. Lymph node, needle/core biopsy, left axilla - DUCTAL CARCINOMA.  Receptor Status: ER(100%+), PR (nrg), Her2-neu (neg,ratio=1.29), Ki-67(15%)  Did patient present with symptoms (if so, please note symptoms) or was this found on screening mammography?:had yearly physical, Md felt mass, sent for mammogram  Past/Anticipated interventions by surgeon, if any:Dr. Melina Schools no surgery scheduled as yet  Past/Anticipated interventions by medical oncology, if any: Dr.Feng appointment scheduled for 08/20/16  Lymphedema issues, if any:  NO  Pain issues, if any:  NO  SAFETY ISSUES: No  Prior radiation?NO   Pacemaker/ICD? no  Possible current pregnancy? no  Is the patient on methotrexate? no  Current Complaints / other details:  Divorced, , menarche age 26, G23P0,, miscarriage, oral contraceptives 15 years, , depression,anxiety,moderatwe alcohol (1beers  Weekly ) ,no tobacco use ,hx marijuana  2-3 x week  Mother Breast cancer dx age 49, , double mastectomy;  Living age 63,, ,Bi-polar,  depression,Father depression,,PTSD;,  sister depression BP 110/74 (BP Location: Right Arm, Patient Position: Sitting, Cuff Size: Normal)   Pulse 74   Temp 98.2 F (36.8 C) (Oral)   Resp 16   Ht 5' 4.75" (1.645 m)   Wt 126 lb (57.2 kg)   BMI 21.13 kg/m   Wt Readings from Last 3 Encounters:  08/20/16 126 lb (57.2 kg)  06/19/16 127 lb 6.4 oz (57.8 kg)  06/29/15 129 lb (58.5 kg)   Jenene Slicker, RN 08/17/2016,3:06 PM

## 2016-08-18 ENCOUNTER — Encounter: Payer: Self-pay | Admitting: Hematology

## 2016-08-18 ENCOUNTER — Telehealth: Payer: Self-pay | Admitting: Hematology

## 2016-08-18 NOTE — Telephone Encounter (Signed)
Pt cld back to r/s appt that was orginally scheduled on 2/22. Due to conflict w/her job she rescheduled to 3/1. Aware to arrive 30 minutes early.

## 2016-08-20 ENCOUNTER — Encounter: Payer: Self-pay | Admitting: Radiation Oncology

## 2016-08-20 ENCOUNTER — Ambulatory Visit: Payer: BLUE CROSS/BLUE SHIELD | Admitting: Hematology

## 2016-08-20 ENCOUNTER — Ambulatory Visit
Admission: RE | Admit: 2016-08-20 | Discharge: 2016-08-20 | Disposition: A | Discharge status: Home / Self Care | Payer: BLUE CROSS/BLUE SHIELD | Source: Ambulatory Visit | Attending: Radiation Oncology | Admitting: Radiation Oncology

## 2016-08-20 VITALS — BP 110/74 | HR 74 | Temp 98.2°F | Resp 16 | Ht 64.75 in | Wt 126.0 lb

## 2016-08-20 DIAGNOSIS — F329 Major depressive disorder, single episode, unspecified: Secondary | ICD-10-CM | POA: Diagnosis not present

## 2016-08-20 DIAGNOSIS — C773 Secondary and unspecified malignant neoplasm of axilla and upper limb lymph nodes: Secondary | ICD-10-CM | POA: Diagnosis not present

## 2016-08-20 DIAGNOSIS — E039 Hypothyroidism, unspecified: Secondary | ICD-10-CM | POA: Diagnosis not present

## 2016-08-20 DIAGNOSIS — Z803 Family history of malignant neoplasm of breast: Secondary | ICD-10-CM | POA: Diagnosis not present

## 2016-08-20 DIAGNOSIS — Z170 Estrogen receptor positive status [ER+]: Secondary | ICD-10-CM | POA: Diagnosis not present

## 2016-08-20 DIAGNOSIS — C50412 Malignant neoplasm of upper-outer quadrant of left female breast: Secondary | ICD-10-CM

## 2016-08-20 DIAGNOSIS — C50912 Malignant neoplasm of unspecified site of left female breast: Secondary | ICD-10-CM

## 2016-08-20 HISTORY — DX: Malignant neoplasm of unspecified site of unspecified female breast: C50.919

## 2016-08-20 NOTE — Progress Notes (Signed)
Please see the Nurse Progress Note in the MD Initial Consult Encounter for this patient. 
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
## 2016-08-25 NOTE — Progress Notes (Signed)
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 Darcus Austin, a trained medical scribe. The creation of this record is based on the scribe's personal observations and the provider's statements to them. This document has been checked and approved by the attending provider.

## 2016-08-26 ENCOUNTER — Telehealth: Payer: Self-pay

## 2016-08-27 ENCOUNTER — Telehealth: Payer: Self-pay | Admitting: *Deleted

## 2016-08-27 ENCOUNTER — Encounter: Payer: Self-pay | Admitting: Hematology

## 2016-08-27 ENCOUNTER — Telehealth: Payer: Self-pay | Admitting: Hematology

## 2016-08-27 ENCOUNTER — Ambulatory Visit (HOSPITAL_BASED_OUTPATIENT_CLINIC_OR_DEPARTMENT_OTHER): Payer: BLUE CROSS/BLUE SHIELD | Admitting: Hematology

## 2016-08-27 VITALS — BP 112/73 | HR 78 | Temp 97.9°F | Resp 18 | Ht 64.75 in | Wt 126.0 lb

## 2016-08-27 DIAGNOSIS — Z803 Family history of malignant neoplasm of breast: Secondary | ICD-10-CM

## 2016-08-27 DIAGNOSIS — Z170 Estrogen receptor positive status [ER+]: Secondary | ICD-10-CM | POA: Diagnosis not present

## 2016-08-27 DIAGNOSIS — C773 Secondary and unspecified malignant neoplasm of axilla and upper limb lymph nodes: Secondary | ICD-10-CM

## 2016-08-27 DIAGNOSIS — C50412 Malignant neoplasm of upper-outer quadrant of left female breast: Secondary | ICD-10-CM

## 2016-08-27 MED ORDER — LETROZOLE 2.5 MG PO TABS: 2.5000 mg | ORAL_TABLET | Freq: Every day | ORAL | 1 refills | Status: DC

## 2016-08-27 NOTE — Telephone Encounter (Signed)
Left message to confirm appt has been made for 3/22 at 8:15am .

## 2016-08-27 NOTE — Telephone Encounter (Signed)
No template/ time available to schedule patient for 8:15 am per 08/27/2016 los. Will call patient when the schedule is made to confirm appt.

## 2016-08-27 NOTE — Telephone Encounter (Signed)
Received order per Dr. Burr Medico for Mammaprint testing on core bx. Requisition sent to [facility]

## 2016-08-27 NOTE — Telephone Encounter (Signed)
Left message to follow up after new patient visit. Contact information given.

## 2016-09-07 ENCOUNTER — Telehealth: Payer: Self-pay | Admitting: *Deleted

## 2016-09-07 NOTE — Telephone Encounter (Signed)
Received notification from [facility] there was Insufficient tissue to run Mammaprint testing on core bx. Physician team notified.

## 2016-09-10 DIAGNOSIS — C773 Secondary and unspecified malignant neoplasm of axilla and upper limb lymph nodes: Secondary | ICD-10-CM | POA: Diagnosis not present

## 2016-09-10 DIAGNOSIS — C50412 Malignant neoplasm of upper-outer quadrant of left female breast: Secondary | ICD-10-CM | POA: Diagnosis not present

## 2016-09-15 ENCOUNTER — Encounter (HOSPITAL_COMMUNITY): Payer: Self-pay
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
## 2016-09-17 ENCOUNTER — Telehealth: Payer: Self-pay | Admitting: Hematology

## 2016-09-17 ENCOUNTER — Telehealth: Payer: Self-pay | Admitting: *Deleted

## 2016-09-17 ENCOUNTER — Ambulatory Visit (HOSPITAL_BASED_OUTPATIENT_CLINIC_OR_DEPARTMENT_OTHER): Payer: BLUE CROSS/BLUE SHIELD | Admitting: Hematology

## 2016-09-17 VITALS — BP 99/65 | HR 79 | Temp 97.8°F | Resp 18 | Ht 64.75 in | Wt 124.0 lb

## 2016-09-17 DIAGNOSIS — E039 Hypothyroidism, unspecified: Secondary | ICD-10-CM

## 2016-09-17 DIAGNOSIS — Z170 Estrogen receptor positive status [ER+]: Secondary | ICD-10-CM

## 2016-09-17 DIAGNOSIS — F329 Major depressive disorder, single episode, unspecified: Secondary | ICD-10-CM

## 2016-09-17 DIAGNOSIS — C50412 Malignant neoplasm of upper-outer quadrant of left female breast: Secondary | ICD-10-CM | POA: Diagnosis not present

## 2016-09-17 NOTE — Telephone Encounter (Signed)
Received Mammaprint results of HIGH RISK. Physician team notified. ° °

## 2016-09-17 NOTE — Telephone Encounter (Signed)
Gave patient AVS and calender per 3/22. Treatment not yet scheduled will call and confirm with patient when appt is schedule. ECHO scheduled with Earnest Bailey in ECHO.

## 2016-09-18 ENCOUNTER — Encounter: Payer: Self-pay | Admitting: *Deleted

## 2016-09-18 ENCOUNTER — Encounter: Payer: Self-pay | Admitting: Hematology

## 2016-09-18 NOTE — Progress Notes (Signed)
[place] Work  Clinical Social Work was referred by Futures trader for assessment of psychosocial needs.  Clinical Social Worker contacted patient at home to offer support and assess for needs.  CSW left supportive message for pt. CSW introduced self, explained role of CSW/Pt and Family Support Team, support groups and other resources to assist. CSW awaits return call.      Loren Racer, LCSW, OSW-C Clinical Social Worker [place]  [place] Phone: [phone] Fax: [phone]

## 2016-09-21 ENCOUNTER — Telehealth: Payer: Self-pay | Admitting: Hematology

## 2016-09-21 DIAGNOSIS — C50412 Malignant neoplasm of upper-outer quadrant of left female breast: Secondary | ICD-10-CM | POA: Diagnosis not present

## 2016-09-21 DIAGNOSIS — C50912 Malignant neoplasm of unspecified site of left female breast: Secondary | ICD-10-CM | POA: Diagnosis not present

## 2016-09-21 NOTE — Telephone Encounter (Signed)
Readjust appts to coincide with Chemo. Unable to reach patient left message and documented in appt for 3/27 that patient needs new schedule.

## 2016-09-22 ENCOUNTER — Encounter: Payer: Self-pay | Admitting: *Deleted

## 2016-09-22 ENCOUNTER — Other Ambulatory Visit (HOSPITAL_BASED_OUTPATIENT_CLINIC_OR_DEPARTMENT_OTHER): Payer: BLUE CROSS/BLUE SHIELD

## 2016-09-22 ENCOUNTER — Other Ambulatory Visit: Payer: BLUE CROSS/BLUE SHIELD

## 2016-09-22 DIAGNOSIS — C50412 Malignant neoplasm of upper-outer quadrant of left female breast: Secondary | ICD-10-CM

## 2016-09-22 DIAGNOSIS — Z170 Estrogen receptor positive status [ER+]: Principal | ICD-10-CM

## 2016-09-22 LAB — COMPREHENSIVE METABOLIC PANEL
ALT: 9 U/L (ref 0–55)
ANION GAP: 11 meq/L (ref 3–11)
AST: 16 U/L (ref 5–34)
Albumin: 4.2 g/dL (ref 3.5–5.0)
Alkaline Phosphatase: 73 U/L (ref 40–150)
BILIRUBIN TOTAL: 0.67 mg/dL (ref 0.20–1.20)
BUN: 13.2 mg/dL (ref 7.0–26.0)
CHLORIDE: 109 meq/L (ref 98–109)
CO2: 24 meq/L (ref 22–29)
Calcium: 9.4 mg/dL (ref 8.4–10.4)
Creatinine: 0.9 mg/dL (ref 0.6–1.1)
EGFR: 71 mL/min/{1.73_m2} — AB (ref >90)
Glucose: 137 mg/dl (ref 70–140)
POTASSIUM: 4.6 meq/L (ref 3.5–5.1)
Sodium: 144 mEq/L (ref 136–145)
Total Protein: 7.1 g/dL (ref 6.4–8.3)

## 2016-09-22 LAB — CBC WITH DIFFERENTIAL/PLATELET
BASO%: 0.3 % (ref 0.0–2.0)
Basophils Absolute: 0 10*3/uL (ref 0.0–0.1)
EOS ABS: 0 10*3/uL (ref 0.0–0.5)
EOS%: 0.2 % (ref 0.0–7.0)
HCT: 36.9 % (ref 34.8–46.6)
HGB: 12.6 g/dL (ref 11.6–15.9)
LYMPH%: 28.8 % (ref 14.0–49.7)
MCH: 30.6 pg (ref 25.1–34.0)
MCHC: 34.3 g/dL (ref 31.5–36.0)
MCV: 89.1 fL (ref 79.5–101.0)
MONO#: 1.1 10*3/uL — AB (ref 0.1–0.9)
MONO%: 10 % (ref 0.0–14.0)
NEUT#: 6.9 10*3/uL — ABNORMAL HIGH (ref 1.5–6.5)
NEUT%: 60.7 % (ref 38.4–76.8)
PLATELETS: 247 10*3/uL (ref 145–400)
RBC: 4.13 10*6/uL (ref 3.70–5.45)
RDW: 12.6 % (ref 11.2–14.5)
WBC: 11.3 10*3/uL — ABNORMAL HIGH (ref 3.9–10.3)
lymph#: 3.3 10*3/uL (ref 0.9–3.3)

## 2016-09-22 NOTE — Progress Notes (Signed)
[place] Work  Clinical Social Work was referred by Futures trader for assessment of psychosocial needs.  Clinical Social Worker contacted patient at home to offer support and assess for needs.  CSW left a message introducing CSW and encouraging patient to call with questions and concerns.       Johnnye Lana, MSW, LCSW, OSW-C Clinical Social Worker [facility] [phone]

## 2016-09-23 ENCOUNTER — Ambulatory Visit (HOSPITAL_COMMUNITY)
Admission: RE | Admit: 2016-09-23 | Discharge: 2016-09-23 | Disposition: A | Discharge status: Home / Self Care | Payer: BLUE CROSS/BLUE SHIELD | Source: Ambulatory Visit | Attending: Hematology | Admitting: Hematology

## 2016-09-23 ENCOUNTER — Encounter: Payer: Self-pay | Admitting: *Deleted

## 2016-09-23 DIAGNOSIS — I340 Nonrheumatic mitral (valve) insufficiency: Secondary | ICD-10-CM | POA: Insufficient documentation

## 2016-09-23 DIAGNOSIS — C50412 Malignant neoplasm of upper-outer quadrant of left female breast: Secondary | ICD-10-CM | POA: Diagnosis not present

## 2016-09-23 DIAGNOSIS — Z170 Estrogen receptor positive status [ER+]: Secondary | ICD-10-CM | POA: Diagnosis not present

## 2016-09-23 DIAGNOSIS — Z09 Encounter for follow-up examination after completed treatment for conditions other than malignant neoplasm: Secondary | ICD-10-CM | POA: Diagnosis not present

## 2016-09-23 NOTE — Progress Notes (Signed)
  Echocardiogram 2D Echocardiogram has been performed.  Tresa Res 09/23/2016, 9:41 AM

## 2016-09-23 NOTE — Progress Notes (Signed)
[place] Work  Clinical Social Work was referred by Futures trader for assessment of psychosocial needs.  Clinical Social Worker met with patient and patients sister in [place] office at [facility] to offer support and assess for needs.  Patient expressed financial concerns due to increased medical bills.  Patient is currently working and has supportive sister.  CSW and patient reviewed financial resources and support at [facility].  CSW provided applications for [facility] in [place].  Patient plans to collect required documentation and contact CSW.  CSW also provided patient with a Medicaid application.  Patient plans to apply for the [place] with the financial advocate when she starts treatment next week.  CSW provided contact information and patient knows to contact CSW with questions or concerns.           Johnnye Lana, MSW, LCSW, OSW-C Clinical Social Worker [facility] [phone]
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
## 2016-09-28 ENCOUNTER — Telehealth: Payer: Self-pay

## 2016-09-28 MED ORDER — LIDOCAINE-PRILOCAINE 2.5-2.5 % EX CREA: 1.0000 "application " | TOPICAL_CREAM | CUTANEOUS | 0 refills | Status: DC | PRN

## 2016-09-28 NOTE — Telephone Encounter (Signed)
Pt called stating she has stopped her letrozole last Thursday 3/29 and is asking if her premeds are ordered. She described emla cream. Called pt back: She got her port last Monday. EMLA cream ordered. Directions given for use. Request placed with scheduler to move appt to earlier in AM if possible.

## 2016-09-29 ENCOUNTER — Other Ambulatory Visit: Payer: Self-pay | Admitting: Hematology

## 2016-09-29 ENCOUNTER — Other Ambulatory Visit: Payer: Self-pay | Admitting: *Deleted

## 2016-09-29 DIAGNOSIS — C50412 Malignant neoplasm of upper-outer quadrant of left female breast: Secondary | ICD-10-CM

## 2016-09-29 DIAGNOSIS — Z170 Estrogen receptor positive status [ER+]: Principal | ICD-10-CM

## 2016-09-29 MED ORDER — LIDOCAINE-PRILOCAINE 2.5-2.5 % EX CREA: TOPICAL_CREAM | CUTANEOUS | 3 refills | Status: DC

## 2016-09-29 MED ORDER — ONDANSETRON HCL 8 MG PO TABS: 8.0000 mg | ORAL_TABLET | Freq: Two times a day (BID) | ORAL | 1 refills | Status: DC | PRN

## 2016-09-29 MED ORDER — PROCHLORPERAZINE MALEATE 10 MG PO TABS: 10.0000 mg | ORAL_TABLET | Freq: Four times a day (QID) | ORAL | 1 refills | Status: DC | PRN

## 2016-10-01 ENCOUNTER — Ambulatory Visit (HOSPITAL_BASED_OUTPATIENT_CLINIC_OR_DEPARTMENT_OTHER): Payer: BLUE CROSS/BLUE SHIELD

## 2016-10-01 ENCOUNTER — Ambulatory Visit: Payer: BLUE CROSS/BLUE SHIELD | Admitting: Hematology

## 2016-10-01 ENCOUNTER — Ambulatory Visit: Payer: BLUE CROSS/BLUE SHIELD

## 2016-10-01 ENCOUNTER — Encounter: Payer: Self-pay | Admitting: *Deleted

## 2016-10-01 ENCOUNTER — Ambulatory Visit (HOSPITAL_BASED_OUTPATIENT_CLINIC_OR_DEPARTMENT_OTHER): Payer: BLUE CROSS/BLUE SHIELD | Admitting: Hematology

## 2016-10-01 ENCOUNTER — Encounter: Payer: Self-pay | Admitting: Hematology

## 2016-10-01 ENCOUNTER — Telehealth: Payer: Self-pay | Admitting: Hematology

## 2016-10-01 VITALS — BP 116/77 | HR 66 | Temp 97.7°F | Resp 18 | Ht 64.75 in | Wt 125.7 lb

## 2016-10-01 DIAGNOSIS — Z5111 Encounter for antineoplastic chemotherapy: Secondary | ICD-10-CM

## 2016-10-01 DIAGNOSIS — C50412 Malignant neoplasm of upper-outer quadrant of left female breast: Secondary | ICD-10-CM

## 2016-10-01 DIAGNOSIS — F329 Major depressive disorder, single episode, unspecified: Secondary | ICD-10-CM

## 2016-10-01 DIAGNOSIS — Z170 Estrogen receptor positive status [ER+]: Secondary | ICD-10-CM | POA: Diagnosis not present

## 2016-10-01 DIAGNOSIS — E039 Hypothyroidism, unspecified: Secondary | ICD-10-CM

## 2016-10-01 MED ORDER — PALONOSETRON HCL INJECTION 0.25 MG/5ML
INTRAVENOUS | Status: AC
  Filled 2016-10-01: qty 5

## 2016-10-01 MED ORDER — PEGFILGRASTIM 6 MG/0.6ML ~~LOC~~ PSKT
6.0000 mg | PREFILLED_SYRINGE | Freq: Once | SUBCUTANEOUS | Status: AC
  Administered 2016-10-01: 6 mg via SUBCUTANEOUS
  Filled 2016-10-01: qty 0.6

## 2016-10-01 MED ORDER — SODIUM CHLORIDE 0.9 % IV SOLN
Freq: Once | INTRAVENOUS | Status: AC
  Administered 2016-10-01: 13:00:00 via INTRAVENOUS
  Filled 2016-10-01: qty 5

## 2016-10-01 MED ORDER — PROCHLORPERAZINE MALEATE 10 MG PO TABS: 10.0000 mg | ORAL_TABLET | Freq: Four times a day (QID) | ORAL | 1 refills | Status: DC | PRN

## 2016-10-01 MED ORDER — PALONOSETRON HCL INJECTION 0.25 MG/5ML
0.2500 mg | Freq: Once | INTRAVENOUS | Status: AC
  Administered 2016-10-01: 0.25 mg via INTRAVENOUS

## 2016-10-01 MED ORDER — ONDANSETRON HCL 8 MG PO TABS: 8.0000 mg | ORAL_TABLET | Freq: Two times a day (BID) | ORAL | 1 refills | Status: DC | PRN

## 2016-10-01 MED ORDER — DOXORUBICIN HCL CHEMO IV INJECTION 2 MG/ML
60.0000 mg/m2 | Freq: Once | INTRAVENOUS | Status: AC
  Administered 2016-10-01: 96 mg via INTRAVENOUS
  Filled 2016-10-01: qty 48

## 2016-10-01 MED ORDER — SODIUM CHLORIDE 0.9 % IV SOLN
Freq: Once | INTRAVENOUS | Status: AC
  Administered 2016-10-01: 13:00:00 via INTRAVENOUS

## 2016-10-01 MED ORDER — HEPARIN SOD (PORK) LOCK FLUSH 100 UNIT/ML IV SOLN
500.0000 [IU] | Freq: Once | INTRAVENOUS | Status: AC | PRN
  Administered 2016-10-01: 500 [IU]
  Filled 2016-10-01: qty 5

## 2016-10-01 MED ORDER — SODIUM CHLORIDE 0.9 % IV SOLN
600.0000 mg/m2 | Freq: Once | INTRAVENOUS | Status: AC
  Administered 2016-10-01: 960 mg via INTRAVENOUS
  Filled 2016-10-01: qty 48

## 2016-10-01 MED ORDER — SODIUM CHLORIDE 0.9% FLUSH
10.0000 mL | INTRAVENOUS | Status: DC | PRN
  Administered 2016-10-01: 10 mL
  Filled 2016-10-01: qty 10

## 2016-10-01 NOTE — Patient Instructions (Addendum)
[facility] Discharge Instructions for Patients Receiving Chemotherapy  Today you received the following chemotherapy agents Adriamycin and Cytoxan  To help prevent nausea and vomiting after your treatment, we encourage you to take your nausea medication as instructed by your MD.   If you develop nausea and vomiting that is not controlled by your nausea medication, call the clinic.   BELOW ARE SYMPTOMS THAT SHOULD BE REPORTED IMMEDIATELY:  *FEVER GREATER THAN 100.5 F  *CHILLS WITH OR WITHOUT FEVER  NAUSEA AND VOMITING THAT IS NOT CONTROLLED WITH YOUR NAUSEA MEDICATION  *UNUSUAL SHORTNESS OF BREATH  *UNUSUAL BRUISING OR BLEEDING  TENDERNESS IN MOUTH AND THROAT WITH OR WITHOUT PRESENCE OF ULCERS  *URINARY PROBLEMS  *BOWEL PROBLEMS  UNUSUAL RASH Items with * indicate a potential emergency and should be followed up as soon as possible.  Feel free to call the clinic you have any questions or concerns. The clinic phone number is [phone].  Please show the [facility] at check-in to the Emergency Department and triage nurse.   Doxorubicin injection (Adriamycin) What is this medicine? DOXORUBICIN (dox oh ROO bi sin) is a chemotherapy drug. It is used to treat many kinds of cancer like leukemia, lymphoma, neuroblastoma, sarcoma, and Wilms' tumor. It is also used to treat bladder cancer, breast cancer, lung cancer, ovarian cancer, stomach cancer, and thyroid cancer. This medicine may be used for other purposes; ask your health care provider or pharmacist if you have questions. COMMON BRAND NAME(S): Adriamycin, Adriamycin PFS, Adriamycin RDF, Rubex What should I tell my health care provider before I take this medicine? They need to know if you have any of these conditions: -heart disease -history of low blood counts caused by a medicine -liver disease -recent or ongoing radiation therapy -an unusual or allergic reaction to doxorubicin, other chemotherapy  agents, other medicines, foods, dyes, or preservatives -pregnant or trying to get pregnant -breast-feeding How should I use this medicine? This drug is given as an infusion into a vein. It is administered in a hospital or clinic by a specially trained health care professional. If you have pain, swelling, burning or any unusual feeling around the site of your injection, tell your health care professional right away. Talk to your pediatrician regarding the use of this medicine in children. Special care may be needed. Overdosage: If you think you have taken too much of this medicine contact a poison control center or emergency room at once. NOTE: This medicine is only for you. Do not share this medicine with others. What if I miss a dose? It is important not to miss your dose. Call your doctor or health care professional if you are unable to keep an appointment. What may interact with this medicine? This medicine may interact with the following medications: -6-mercaptopurine -paclitaxel -phenytoin -St. John's Wort -trastuzumab -verapamil This list may not describe all possible interactions. Give your health care provider a list of all the medicines, herbs, non-prescription drugs, or dietary supplements you use. Also tell them if you smoke, drink alcohol, or use illegal drugs. Some items may interact with your medicine. What should I watch for while using this medicine? This drug may make you feel generally unwell. This is not uncommon, as chemotherapy can affect healthy cells as well as cancer cells. Report any side effects. Continue your course of treatment even though you feel ill unless your doctor tells you to stop. There is a maximum amount of this medicine you should receive throughout your life.  The amount depends on the medical condition being treated and your overall health. Your doctor will watch how much of this medicine you receive in your lifetime. Tell your doctor if you have taken  this medicine before. You may need blood work done while you are taking this medicine. Your urine may turn red for a few days after your dose. This is not blood. If your urine is dark or brown, call your doctor. In some cases, you may be given additional medicines to help with side effects. Follow all directions for their use. Call your doctor or health care professional for advice if you get a fever, chills or sore throat, or other symptoms of a cold or flu. Do not treat yourself. This drug decreases your body's ability to fight infections. Try to avoid being around people who are sick. This medicine may increase your risk to bruise or bleed. Call your doctor or health care professional if you notice any unusual bleeding. Talk to your doctor about your risk of cancer. You may be more at risk for certain types of cancers if you take this medicine. Do not become pregnant while taking this medicine or for 6 months after stopping it. Women should inform their doctor if they wish to become pregnant or think they might be pregnant. Men should not father a child while taking this medicine and for 6 months after stopping it. There is a potential for serious side effects to an unborn child. Talk to your health care professional or pharmacist for more information. Do not breast-feed an infant while taking this medicine. This medicine has caused ovarian failure in some women and reduced sperm counts in some men This medicine may interfere with the ability to have a child. Talk with your doctor or health care professional if you are concerned about your fertility. What side effects may I notice from receiving this medicine? Side effects that you should report to your doctor or health care professional as soon as possible: -allergic reactions like skin rash, itching or hives, swelling of the face, lips, or tongue -breathing problems -chest pain -fast or irregular heartbeat -low blood counts - this medicine may  decrease the number of white blood cells, red blood cells and platelets. You may be at increased risk for infections and bleeding. -pain, redness, or irritation at site where injected -signs of infection - fever or chills, cough, sore throat, pain or difficulty passing urine -signs of decreased platelets or bleeding - bruising, pinpoint red spots on the skin, black, tarry stools, blood in the urine -swelling of the ankles, feet, hands -tiredness -weakness Side effects that usually do not require medical attention (report to your doctor or health care professional if they continue or are bothersome): -diarrhea -hair loss -mouth sores -nail discoloration or damage -nausea -red colored urine -vomiting This list may not describe all possible side effects. Call your doctor for medical advice about side effects. You may report side effects to FDA at 1-800-FDA-1088. Where should I keep my medicine? This drug is given in a hospital or clinic and will not be stored at home. NOTE: This sheet is a summary. It may not cover all possible information. If you have questions about this medicine, talk to your doctor, pharmacist, or health care provider.  2018 Elsevier/Gold Standard (2015-08-12 11:28:51)  Cyclophosphamide injection (Cytoxan) What is this medicine? CYCLOPHOSPHAMIDE (sye kloe FOSS fa mide) is a chemotherapy drug. It slows the growth of cancer cells. This medicine is used to treat many  types of cancer like lymphoma, myeloma, leukemia, breast cancer, and ovarian cancer, to name a few. This medicine may be used for other purposes; ask your health care provider or pharmacist if you have questions. COMMON BRAND NAME(S): Cytoxan, Neosar What should I tell my health care provider before I take this medicine? They need to know if you have any of these conditions: -blood disorders -history of other chemotherapy -infection -kidney disease -liver disease -recent or ongoing radiation  therapy -tumors in the bone marrow -an unusual or allergic reaction to cyclophosphamide, other chemotherapy, other medicines, foods, dyes, or preservatives -pregnant or trying to get pregnant -breast-feeding How should I use this medicine? This drug is usually given as an injection into a vein or muscle or by infusion into a vein. It is administered in a hospital or clinic by a specially trained health care professional. Talk to your pediatrician regarding the use of this medicine in children. Special care may be needed. Overdosage: If you think you have taken too much of this medicine contact a poison control center or emergency room at once. NOTE: This medicine is only for you. Do not share this medicine with others. What if I miss a dose? It is important not to miss your dose. Call your doctor or health care professional if you are unable to keep an appointment. What may interact with this medicine? This medicine may interact with the following medications: -amiodarone -amphotericin B -azathioprine -certain antiviral medicines for HIV or AIDS such as protease inhibitors (e.g., indinavir, ritonavir) and zidovudine -certain blood pressure medications such as benazepril, captopril, enalapril, fosinopril, lisinopril, moexipril, monopril, perindopril, quinapril, ramipril, trandolapril -certain cancer medications such as anthracyclines (e.g., daunorubicin, doxorubicin), busulfan, cytarabine, paclitaxel, pentostatin, tamoxifen, trastuzumab -certain diuretics such as chlorothiazide, chlorthalidone, hydrochlorothiazide, indapamide, metolazone -certain medicines that treat or prevent blood clots like warfarin -certain muscle relaxants such as succinylcholine -cyclosporine -etanercept -indomethacin -medicines to increase blood counts like filgrastim, pegfilgrastim, sargramostim -medicines used as general anesthesia -metronidazole -natalizumab This list may not describe all possible  interactions. Give your health care provider a list of all the medicines, herbs, non-prescription drugs, or dietary supplements you use. Also tell them if you smoke, drink alcohol, or use illegal drugs. Some items may interact with your medicine. What should I watch for while using this medicine? Visit your doctor for checks on your progress. This drug may make you feel generally unwell. This is not uncommon, as chemotherapy can affect healthy cells as well as cancer cells. Report any side effects. Continue your course of treatment even though you feel ill unless your doctor tells you to stop. Drink water or other fluids as directed. Urinate often, even at night. In some cases, you may be given additional medicines to help with side effects. Follow all directions for their use. Call your doctor or health care professional for advice if you get a fever, chills or sore throat, or other symptoms of a cold or flu. Do not treat yourself. This drug decreases your body's ability to fight infections. Try to avoid being around people who are sick. This medicine may increase your risk to bruise or bleed. Call your doctor or health care professional if you notice any unusual bleeding. Be careful brushing and flossing your teeth or using a toothpick because you may get an infection or bleed more easily. If you have any dental work done, tell your dentist you are receiving this medicine. You may get drowsy or dizzy. Do not drive, use machinery, or do  anything that needs mental alertness until you know how this medicine affects you. Do not become pregnant while taking this medicine or for 1 year after stopping it. Women should inform their doctor if they wish to become pregnant or think they might be pregnant. Men should not father a child while taking this medicine and for 4 months after stopping it. There is a potential for serious side effects to an unborn child. Talk to your health care professional or pharmacist for  more information. Do not breast-feed an infant while taking this medicine. This medicine may interfere with the ability to have a child. This medicine has caused ovarian failure in some women. This medicine has caused reduced sperm counts in some men. You should talk with your doctor or health care professional if you are concerned about your fertility. If you are going to have surgery, tell your doctor or health care professional that you have taken this medicine. What side effects may I notice from receiving this medicine? Side effects that you should report to your doctor or health care professional as soon as possible: -allergic reactions like skin rash, itching or hives, swelling of the face, lips, or tongue -low blood counts - this medicine may decrease the number of white blood cells, red blood cells and platelets. You may be at increased risk for infections and bleeding. -signs of infection - fever or chills, cough, sore throat, pain or difficulty passing urine -signs of decreased platelets or bleeding - bruising, pinpoint red spots on the skin, black, tarry stools, blood in the urine -signs of decreased red blood cells - unusually weak or tired, fainting spells, lightheadedness -breathing problems -dark urine -dizziness -palpitations -swelling of the ankles, feet, hands -trouble passing urine or change in the amount of urine -weight gain -yellowing of the eyes or skin Side effects that usually do not require medical attention (report to your doctor or health care professional if they continue or are bothersome): -changes in nail or skin color -hair loss -missed menstrual periods -mouth sores -nausea, vomiting This list may not describe all possible side effects. Call your doctor for medical advice about side effects. You may report side effects to FDA at 1-800-FDA-1088. Where should I keep my medicine? This drug is given in a hospital or clinic and will not be stored at  home. NOTE: This sheet is a summary. It may not cover all possible information. If you have questions about this medicine, talk to your doctor, pharmacist, or health care provider.  2018 Elsevier/Gold Standard (2012-04-29 16:22:58)  Pegfilgrastim injection (Neulasta) What is this medicine? PEGFILGRASTIM (PEG fil gra stim) is a long-acting granulocyte colony-stimulating factor that stimulates the growth of neutrophils, a type of white blood cell important in the body's fight against infection. It is used to reduce the incidence of fever and infection in patients with certain types of cancer who are receiving chemotherapy that affects the bone marrow, and to increase survival after being exposed to high doses of radiation. This medicine may be used for other purposes; ask your health care provider or pharmacist if you have questions. COMMON BRAND NAME(S): Neulasta What should I tell my health care provider before I take this medicine? They need to know if you have any of these conditions: -kidney disease -latex allergy -ongoing radiation therapy -sickle cell disease -skin reactions to acrylic adhesives (On-Body Injector only) -an unusual or allergic reaction to pegfilgrastim, filgrastim, other medicines, foods, dyes, or preservatives -pregnant or trying to get pregnant -breast-feeding How  should I use this medicine? This medicine is for injection under the skin. If you get this medicine at home, you will be taught how to prepare and give the pre-filled syringe or how to use the On-body Injector. Refer to the patient Instructions for Use for detailed instructions. Use exactly as directed. Tell your healthcare provider immediately if you suspect that the On-body Injector may not have performed as intended or if you suspect the use of the On-body Injector resulted in a missed or partial dose. It is important that you put your used needles and syringes in a special sharps container. Do not put them  in a trash can. If you do not have a sharps container, call your pharmacist or healthcare provider to get one. Talk to your pediatrician regarding the use of this medicine in children. While this drug may be prescribed for selected conditions, precautions do apply. Overdosage: If you think you have taken too much of this medicine contact a poison control center or emergency room at once. NOTE: This medicine is only for you. Do not share this medicine with others. What if I miss a dose? It is important not to miss your dose. Call your doctor or health care professional if you miss your dose. If you miss a dose due to an On-body Injector failure or leakage, a new dose should be administered as soon as possible using a single prefilled syringe for manual use. What may interact with this medicine? Interactions have not been studied. Give your health care provider a list of all the medicines, herbs, non-prescription drugs, or dietary supplements you use. Also tell them if you smoke, drink alcohol, or use illegal drugs. Some items may interact with your medicine. This list may not describe all possible interactions. Give your health care provider a list of all the medicines, herbs, non-prescription drugs, or dietary supplements you use. Also tell them if you smoke, drink alcohol, or use illegal drugs. Some items may interact with your medicine. What should I watch for while using this medicine? You may need blood work done while you are taking this medicine. If you are going to need a MRI, CT scan, or other procedure, tell your doctor that you are using this medicine (On-Body Injector only). What side effects may I notice from receiving this medicine? Side effects that you should report to your doctor or health care professional as soon as possible: -allergic reactions like skin rash, itching or hives, swelling of the face, lips, or tongue -dizziness -fever -pain, redness, or irritation at site where  injected -pinpoint red spots on the skin -red or dark-brown urine -shortness of breath or breathing problems -stomach or side pain, or pain at the shoulder -swelling -tiredness -trouble passing urine or change in the amount of urine Side effects that usually do not require medical attention (report to your doctor or health care professional if they continue or are bothersome): -bone pain -muscle pain This list may not describe all possible side effects. Call your doctor for medical advice about side effects. You may report side effects to FDA at 1-800-FDA-1088. Where should I keep my medicine? Keep out of the reach of children. Store pre-filled syringes in a refrigerator between 2 and 8 degrees C (36 and 46 degrees F). Do not freeze. Keep in carton to protect from light. Throw away this medicine if it is left out of the refrigerator for more than 48 hours. Throw away any unused medicine after the expiration date. NOTE: This  sheet is a summary. It may not cover all possible information. If you have questions about this medicine, talk to your doctor, pharmacist, or health care provider.  2018 Elsevier/Gold Standard (2016-06-11 12:58:03)

## 2016-10-01 NOTE — Telephone Encounter (Signed)
Appointments scheduled per 4.5.18 LOS. Patient given AVS report and calendars with future scheduled appointments. °

## 2016-10-01 NOTE — Patient Instructions (Signed)
Implanted Port Home Guide An implanted port is a type of central line that is placed under the skin. Central lines are used to provide IV access when treatment or nutrition needs to be given through a person's veins. Implanted ports are used for long-term IV access. An implanted port may be placed because:  You need IV medicine that would be irritating to the small veins in your hands or arms.  You need long-term IV medicines, such as antibiotics.  You need IV nutrition for a long period.  You need frequent blood draws for lab tests.  You need dialysis.  Implanted ports are usually placed in the chest area, but they can also be placed in the upper arm, the abdomen, or the leg. An implanted port has two main parts:  Reservoir. The reservoir is round and will appear as a small, raised area under your skin. The reservoir is the part where a needle is inserted to give medicines or draw blood.  Catheter. The catheter is a thin, flexible tube that extends from the reservoir. The catheter is placed into a large vein. Medicine that is inserted into the reservoir goes into the catheter and then into the vein.  How will I care for my incision site? Do not get the incision site wet. Bathe or shower as directed by your health care provider. How is my port accessed? Special steps must be taken to access the port:  Before the port is accessed, a numbing cream can be placed on the skin. This helps numb the skin over the port site.  Your health care provider uses a sterile technique to access the port. ? Your health care provider must put on a mask and sterile gloves. ? The skin over your port is cleaned carefully with an antiseptic and allowed to dry. ? The port is gently pinched between sterile gloves, and a needle is inserted into the port.  Only "non-coring" port needles should be used to access the port. Once the port is accessed, a blood return should be checked. This helps ensure that the port  is in the vein and is not clogged.  If your port needs to remain accessed for a constant infusion, a clear (transparent) bandage will be placed over the needle site. The bandage and needle will need to be changed every week, or as directed by your health care provider.  Keep the bandage covering the needle clean and dry. Do not get it wet. Follow your health care provider's instructions on how to take a shower or bath while the port is accessed.  If your port does not need to stay accessed, no bandage is needed over the port.  What is flushing? Flushing helps keep the port from getting clogged. Follow your health care provider's instructions on how and when to flush the port. Ports are usually flushed with saline solution or a medicine called heparin. The need for flushing will depend on how the port is used.  If the port is used for intermittent medicines or blood draws, the port will need to be flushed: ? After medicines have been given. ? After blood has been drawn. ? As part of routine maintenance.  If a constant infusion is running, the port may not need to be flushed.  How long will my port stay implanted? The port can stay in for as long as your health care provider thinks it is needed. When it is time for the port to come out, surgery will be   done to remove it. The procedure is similar to the one performed when the port was put in. When should I seek immediate medical care? When you have an implanted port, you should seek immediate medical care if:  You notice a bad smell coming from the incision site.  You have swelling, redness, or drainage at the incision site.  You have more swelling or pain at the port site or the surrounding area.  You have a fever that is not controlled with medicine.  This information is not intended to replace advice given to you by your health care provider. Make sure you discuss any questions you have with your health care provider. Document  Released: 06/15/2005 Document Revised: 11/21/2015 Document Reviewed: 02/20/2013 Elsevier Interactive Patient Education  2017 Elsevier Inc.  

## 2016-10-01 NOTE — Progress Notes (Signed)
Pt tolerated first time adria/cytoxan today. Encouraged pt to increased fluids and to make sure she picks up her nausea medications. Explained possible side effects that pt may experience after chemo and when to call for help when there is a concern. Pt sister at chair side as well for support. Pt verbalized understanding of all these instructions. Advised pt to start taking claritin 10mg  daily to help with bone pain from onpro neulasta. Pt was given specific instructions on how to take medications appropriately. Follow up phone call made in the next few days. Pt is aware. Pt works 6-7 days a week at [employer] and cannot miss so much work. Pt is afraid of losing her job over chemo treatments. Advised that pt speak with her supervisor or hr to apply for FMLA so she can be protected from job loss due future absences from work. Pt verbalized understanding and will reach out to her supervisor today.

## 2016-10-07 ENCOUNTER — Encounter: Payer: Self-pay | Admitting: Hematology

## 2016-10-07 NOTE — Progress Notes (Signed)
Called pt to introduce myself as her Arboriculturist and to discuss copay assistance.  Left a couple of messages and requested she return my call at her earliest convenience.

## 2016-10-08 ENCOUNTER — Encounter: Payer: Self-pay | Admitting: Hematology

## 2016-10-08 NOTE — Progress Notes (Signed)
Pt called back so we spoke about copay assistance.  She gave me consent to apply in her behalf so I applied to the [employer]. She was approved for $5,000 for 12 months from 10/08/16;  $1,650 is her standard award, $3,350 is accessible on a [employer] basis as long as funding remains available. Emailed copy of approval letter and POE to Belinda Fisher and [place] in billing and to HIM to scan in pt's chart.  I will also enroll pt in the [facility] First Step program on 10/15/16.  I went over the [employer] and will see if she qualifies when she brings her proof of income on 10/15/16 also.

## 2016-10-15 ENCOUNTER — Ambulatory Visit (HOSPITAL_BASED_OUTPATIENT_CLINIC_OR_DEPARTMENT_OTHER): Payer: BLUE CROSS/BLUE SHIELD | Admitting: Hematology

## 2016-10-15 ENCOUNTER — Other Ambulatory Visit (HOSPITAL_BASED_OUTPATIENT_CLINIC_OR_DEPARTMENT_OTHER): Payer: BLUE CROSS/BLUE SHIELD

## 2016-10-15 ENCOUNTER — Ambulatory Visit (HOSPITAL_BASED_OUTPATIENT_CLINIC_OR_DEPARTMENT_OTHER): Payer: BLUE CROSS/BLUE SHIELD

## 2016-10-15 ENCOUNTER — Encounter: Payer: Self-pay | Admitting: Hematology

## 2016-10-15 ENCOUNTER — Ambulatory Visit: Payer: BLUE CROSS/BLUE SHIELD

## 2016-10-15 ENCOUNTER — Encounter: Payer: Self-pay | Admitting: *Deleted

## 2016-10-15 VITALS — BP 100/58 | HR 76 | Resp 18 | Ht 64.75 in | Wt 123.2 lb

## 2016-10-15 VITALS — BP 92/62 | HR 84 | Temp 97.6°F | Resp 18

## 2016-10-15 DIAGNOSIS — C50412 Malignant neoplasm of upper-outer quadrant of left female breast: Secondary | ICD-10-CM

## 2016-10-15 DIAGNOSIS — Z170 Estrogen receptor positive status [ER+]: Principal | ICD-10-CM

## 2016-10-15 DIAGNOSIS — F329 Major depressive disorder, single episode, unspecified: Secondary | ICD-10-CM

## 2016-10-15 DIAGNOSIS — E039 Hypothyroidism, unspecified: Secondary | ICD-10-CM | POA: Diagnosis not present

## 2016-10-15 DIAGNOSIS — Z5111 Encounter for antineoplastic chemotherapy: Secondary | ICD-10-CM | POA: Diagnosis not present

## 2016-10-15 DIAGNOSIS — Z95828 Presence of other vascular implants and grafts: Secondary | ICD-10-CM | POA: Insufficient documentation

## 2016-10-15 LAB — COMPREHENSIVE METABOLIC PANEL
ALBUMIN: 4.1 g/dL (ref 3.5–5.0)
ALK PHOS: 88 U/L (ref 40–150)
ALT: 6 U/L (ref 0–55)
AST: 16 U/L (ref 5–34)
Anion Gap: 10 mEq/L (ref 3–11)
BUN: 13.4 mg/dL (ref 7.0–26.0)
CALCIUM: 9 mg/dL (ref 8.4–10.4)
CO2: 25 mEq/L (ref 22–29)
Chloride: 104 mEq/L (ref 98–109)
Creatinine: 0.7 mg/dL (ref 0.6–1.1)
Glucose: 110 mg/dl (ref 70–140)
POTASSIUM: 3.8 meq/L (ref 3.5–5.1)
Sodium: 139 mEq/L (ref 136–145)
Total Bilirubin: 0.24 mg/dL (ref 0.20–1.20)
Total Protein: 6.8 g/dL (ref 6.4–8.3)

## 2016-10-15 LAB — CBC WITH DIFFERENTIAL/PLATELET
BASO%: 0.6 % (ref 0.0–2.0)
BASOS ABS: 0.1 10*3/uL (ref 0.0–0.1)
EOS ABS: 0 10*3/uL (ref 0.0–0.5)
EOS%: 0.1 % (ref 0.0–7.0)
HEMATOCRIT: 35.3 % (ref 34.8–46.6)
HEMOGLOBIN: 12 g/dL (ref 11.6–15.9)
LYMPH#: 1.6 10*3/uL (ref 0.9–3.3)
LYMPH%: 12.1 % — ABNORMAL LOW (ref 14.0–49.7)
MCH: 30.2 pg (ref 25.1–34.0)
MCHC: 34 g/dL (ref 31.5–36.0)
MCV: 88.9 fL (ref 79.5–101.0)
MONO#: 0.7 10*3/uL (ref 0.1–0.9)
MONO%: 5.4 % (ref 0.0–14.0)
NEUT#: 10.5 10*3/uL — ABNORMAL HIGH (ref 1.5–6.5)
NEUT%: 81.8 % — ABNORMAL HIGH (ref 38.4–76.8)
PLATELETS: 223 10*3/uL (ref 145–400)
RBC: 3.97 10*6/uL (ref 3.70–5.45)
RDW: 12.6 % (ref 11.2–14.5)
WBC: 12.9 10*3/uL — ABNORMAL HIGH (ref 3.9–10.3)

## 2016-10-15 MED ORDER — HEPARIN SOD (PORK) LOCK FLUSH 100 UNIT/ML IV SOLN
500.0000 [IU] | Freq: Once | INTRAVENOUS | Status: AC | PRN
  Administered 2016-10-15: 500 [IU]
  Filled 2016-10-15: qty 5

## 2016-10-15 MED ORDER — DOXORUBICIN HCL CHEMO IV INJECTION 2 MG/ML
60.0000 mg/m2 | Freq: Once | INTRAVENOUS | Status: AC
  Administered 2016-10-15: 96 mg via INTRAVENOUS
  Filled 2016-10-15: qty 48

## 2016-10-15 MED ORDER — SODIUM CHLORIDE 0.9% FLUSH
10.0000 mL | INTRAVENOUS | Status: DC | PRN
  Administered 2016-10-15: 10 mL
  Filled 2016-10-15: qty 10

## 2016-10-15 MED ORDER — PEGFILGRASTIM 6 MG/0.6ML ~~LOC~~ PSKT
6.0000 mg | PREFILLED_SYRINGE | Freq: Once | SUBCUTANEOUS | Status: AC
  Administered 2016-10-15: 6 mg via SUBCUTANEOUS
  Filled 2016-10-15: qty 0.6

## 2016-10-15 MED ORDER — PALONOSETRON HCL INJECTION 0.25 MG/5ML
0.2500 mg | Freq: Once | INTRAVENOUS | Status: AC
Start: 2016-10-15 — End: 2016-10-15
  Administered 2016-10-15: 0.25 mg via INTRAVENOUS

## 2016-10-15 MED ORDER — PALONOSETRON HCL INJECTION 0.25 MG/5ML
INTRAVENOUS | Status: AC
  Filled 2016-10-15: qty 5

## 2016-10-15 MED ORDER — SODIUM CHLORIDE 0.9 % IV SOLN
Freq: Once | INTRAVENOUS | Status: AC
Start: 2016-10-15 — End: 2016-10-15
  Administered 2016-10-15: 10:00:00 via INTRAVENOUS

## 2016-10-15 MED ORDER — SODIUM CHLORIDE 0.9 % IV SOLN
600.0000 mg/m2 | Freq: Once | INTRAVENOUS | Status: AC
  Administered 2016-10-15: 960 mg via INTRAVENOUS
  Filled 2016-10-15: qty 48

## 2016-10-15 MED ORDER — SODIUM CHLORIDE 0.9% FLUSH
10.0000 mL | Freq: Once | INTRAVENOUS | Status: AC
  Administered 2016-10-15: 10 mL
  Filled 2016-10-15: qty 10

## 2016-10-15 MED ORDER — SODIUM CHLORIDE 0.9 % IV SOLN
Freq: Once | INTRAVENOUS | Status: AC
  Administered 2016-10-15: 10:00:00 via INTRAVENOUS
  Filled 2016-10-15: qty 5

## 2016-10-15 NOTE — Patient Instructions (Signed)
[facility] Discharge Instructions for Patients Receiving Chemotherapy  Today you received the following chemotherapy agents Adriamycin and Cytoxan  To help prevent nausea and vomiting after your treatment, we encourage you to take your nausea medication as instructed by your MD.   If you develop nausea and vomiting that is not controlled by your nausea medication, call the clinic.   BELOW ARE SYMPTOMS THAT SHOULD BE REPORTED IMMEDIATELY:  *FEVER GREATER THAN 100.5 F  *CHILLS WITH OR WITHOUT FEVER  NAUSEA AND VOMITING THAT IS NOT CONTROLLED WITH YOUR NAUSEA MEDICATION  *UNUSUAL SHORTNESS OF BREATH  *UNUSUAL BRUISING OR BLEEDING  TENDERNESS IN MOUTH AND THROAT WITH OR WITHOUT PRESENCE OF ULCERS  *URINARY PROBLEMS  *BOWEL PROBLEMS  UNUSUAL RASH Items with * indicate a potential emergency and should be followed up as soon as possible.  Feel free to call the clinic you have any questions or concerns. The clinic phone number is [phone].  Please show the [facility] at check-in to the Emergency Department and triage nurse.   Doxorubicin injection (Adriamycin) What is this medicine? DOXORUBICIN (dox oh ROO bi sin) is a chemotherapy drug. It is used to treat many kinds of cancer like leukemia, lymphoma, neuroblastoma, sarcoma, and Wilms' tumor. It is also used to treat bladder cancer, breast cancer, lung cancer, ovarian cancer, stomach cancer, and thyroid cancer. This medicine may be used for other purposes; ask your health care provider or pharmacist if you have questions. COMMON BRAND NAME(S): Adriamycin, Adriamycin PFS, Adriamycin RDF, Rubex What should I tell my health care provider before I take this medicine? They need to know if you have any of these conditions: -heart disease -history of low blood counts caused by a medicine -liver disease -recent or ongoing radiation therapy -an unusual or allergic reaction to doxorubicin, other chemotherapy  agents, other medicines, foods, dyes, or preservatives -pregnant or trying to get pregnant -breast-feeding How should I use this medicine? This drug is given as an infusion into a vein. It is administered in a hospital or clinic by a specially trained health care professional. If you have pain, swelling, burning or any unusual feeling around the site of your injection, tell your health care professional right away. Talk to your pediatrician regarding the use of this medicine in children. Special care may be needed. Overdosage: If you think you have taken too much of this medicine contact a poison control center or emergency room at once. NOTE: This medicine is only for you. Do not share this medicine with others. What if I miss a dose? It is important not to miss your dose. Call your doctor or health care professional if you are unable to keep an appointment. What may interact with this medicine? This medicine may interact with the following medications: -6-mercaptopurine -paclitaxel -phenytoin -St. John's Wort -trastuzumab -verapamil This list may not describe all possible interactions. Give your health care provider a list of all the medicines, herbs, non-prescription drugs, or dietary supplements you use. Also tell them if you smoke, drink alcohol, or use illegal drugs. Some items may interact with your medicine. What should I watch for while using this medicine? This drug may make you feel generally unwell. This is not uncommon, as chemotherapy can affect healthy cells as well as cancer cells. Report any side effects. Continue your course of treatment even though you feel ill unless your doctor tells you to stop. There is a maximum amount of this medicine you should receive throughout your life.  The amount depends on the medical condition being treated and your overall health. Your doctor will watch how much of this medicine you receive in your lifetime. Tell your doctor if you have taken  this medicine before. You may need blood work done while you are taking this medicine. Your urine may turn red for a few days after your dose. This is not blood. If your urine is dark or brown, call your doctor. In some cases, you may be given additional medicines to help with side effects. Follow all directions for their use. Call your doctor or health care professional for advice if you get a fever, chills or sore throat, or other symptoms of a cold or flu. Do not treat yourself. This drug decreases your body's ability to fight infections. Try to avoid being around people who are sick. This medicine may increase your risk to bruise or bleed. Call your doctor or health care professional if you notice any unusual bleeding. Talk to your doctor about your risk of cancer. You may be more at risk for certain types of cancers if you take this medicine. Do not become pregnant while taking this medicine or for 6 months after stopping it. Women should inform their doctor if they wish to become pregnant or think they might be pregnant. Men should not father a child while taking this medicine and for 6 months after stopping it. There is a potential for serious side effects to an unborn child. Talk to your health care professional or pharmacist for more information. Do not breast-feed an infant while taking this medicine. This medicine has caused ovarian failure in some women and reduced sperm counts in some men This medicine may interfere with the ability to have a child. Talk with your doctor or health care professional if you are concerned about your fertility. What side effects may I notice from receiving this medicine? Side effects that you should report to your doctor or health care professional as soon as possible: -allergic reactions like skin rash, itching or hives, swelling of the face, lips, or tongue -breathing problems -chest pain -fast or irregular heartbeat -low blood counts - this medicine may  decrease the number of white blood cells, red blood cells and platelets. You may be at increased risk for infections and bleeding. -pain, redness, or irritation at site where injected -signs of infection - fever or chills, cough, sore throat, pain or difficulty passing urine -signs of decreased platelets or bleeding - bruising, pinpoint red spots on the skin, black, tarry stools, blood in the urine -swelling of the ankles, feet, hands -tiredness -weakness Side effects that usually do not require medical attention (report to your doctor or health care professional if they continue or are bothersome): -diarrhea -hair loss -mouth sores -nail discoloration or damage -nausea -red colored urine -vomiting This list may not describe all possible side effects. Call your doctor for medical advice about side effects. You may report side effects to FDA at 1-800-FDA-1088. Where should I keep my medicine? This drug is given in a hospital or clinic and will not be stored at home. NOTE: This sheet is a summary. It may not cover all possible information. If you have questions about this medicine, talk to your doctor, pharmacist, or health care provider.  2018 Elsevier/Gold Standard (2015-08-12 11:28:51)  Cyclophosphamide injection (Cytoxan) What is this medicine? CYCLOPHOSPHAMIDE (sye kloe FOSS fa mide) is a chemotherapy drug. It slows the growth of cancer cells. This medicine is used to treat many  types of cancer like lymphoma, myeloma, leukemia, breast cancer, and ovarian cancer, to name a few. This medicine may be used for other purposes; ask your health care provider or pharmacist if you have questions. COMMON BRAND NAME(S): Cytoxan, Neosar What should I tell my health care provider before I take this medicine? They need to know if you have any of these conditions: -blood disorders -history of other chemotherapy -infection -kidney disease -liver disease -recent or ongoing radiation  therapy -tumors in the bone marrow -an unusual or allergic reaction to cyclophosphamide, other chemotherapy, other medicines, foods, dyes, or preservatives -pregnant or trying to get pregnant -breast-feeding How should I use this medicine? This drug is usually given as an injection into a vein or muscle or by infusion into a vein. It is administered in a hospital or clinic by a specially trained health care professional. Talk to your pediatrician regarding the use of this medicine in children. Special care may be needed. Overdosage: If you think you have taken too much of this medicine contact a poison control center or emergency room at once. NOTE: This medicine is only for you. Do not share this medicine with others. What if I miss a dose? It is important not to miss your dose. Call your doctor or health care professional if you are unable to keep an appointment. What may interact with this medicine? This medicine may interact with the following medications: -amiodarone -amphotericin B -azathioprine -certain antiviral medicines for HIV or AIDS such as protease inhibitors (e.g., indinavir, ritonavir) and zidovudine -certain blood pressure medications such as benazepril, captopril, enalapril, fosinopril, lisinopril, moexipril, monopril, perindopril, quinapril, ramipril, trandolapril -certain cancer medications such as anthracyclines (e.g., daunorubicin, doxorubicin), busulfan, cytarabine, paclitaxel, pentostatin, tamoxifen, trastuzumab -certain diuretics such as chlorothiazide, chlorthalidone, hydrochlorothiazide, indapamide, metolazone -certain medicines that treat or prevent blood clots like warfarin -certain muscle relaxants such as succinylcholine -cyclosporine -etanercept -indomethacin -medicines to increase blood counts like filgrastim, pegfilgrastim, sargramostim -medicines used as general anesthesia -metronidazole -natalizumab This list may not describe all possible  interactions. Give your health care provider a list of all the medicines, herbs, non-prescription drugs, or dietary supplements you use. Also tell them if you smoke, drink alcohol, or use illegal drugs. Some items may interact with your medicine. What should I watch for while using this medicine? Visit your doctor for checks on your progress. This drug may make you feel generally unwell. This is not uncommon, as chemotherapy can affect healthy cells as well as cancer cells. Report any side effects. Continue your course of treatment even though you feel ill unless your doctor tells you to stop. Drink water or other fluids as directed. Urinate often, even at night. In some cases, you may be given additional medicines to help with side effects. Follow all directions for their use. Call your doctor or health care professional for advice if you get a fever, chills or sore throat, or other symptoms of a cold or flu. Do not treat yourself. This drug decreases your body's ability to fight infections. Try to avoid being around people who are sick. This medicine may increase your risk to bruise or bleed. Call your doctor or health care professional if you notice any unusual bleeding. Be careful brushing and flossing your teeth or using a toothpick because you may get an infection or bleed more easily. If you have any dental work done, tell your dentist you are receiving this medicine. You may get drowsy or dizzy. Do not drive, use machinery, or do  anything that needs mental alertness until you know how this medicine affects you. Do not become pregnant while taking this medicine or for 1 year after stopping it. Women should inform their doctor if they wish to become pregnant or think they might be pregnant. Men should not father a child while taking this medicine and for 4 months after stopping it. There is a potential for serious side effects to an unborn child. Talk to your health care professional or pharmacist for  more information. Do not breast-feed an infant while taking this medicine. This medicine may interfere with the ability to have a child. This medicine has caused ovarian failure in some women. This medicine has caused reduced sperm counts in some men. You should talk with your doctor or health care professional if you are concerned about your fertility. If you are going to have surgery, tell your doctor or health care professional that you have taken this medicine. What side effects may I notice from receiving this medicine? Side effects that you should report to your doctor or health care professional as soon as possible: -allergic reactions like skin rash, itching or hives, swelling of the face, lips, or tongue -low blood counts - this medicine may decrease the number of white blood cells, red blood cells and platelets. You may be at increased risk for infections and bleeding. -signs of infection - fever or chills, cough, sore throat, pain or difficulty passing urine -signs of decreased platelets or bleeding - bruising, pinpoint red spots on the skin, black, tarry stools, blood in the urine -signs of decreased red blood cells - unusually weak or tired, fainting spells, lightheadedness -breathing problems -dark urine -dizziness -palpitations -swelling of the ankles, feet, hands -trouble passing urine or change in the amount of urine -weight gain -yellowing of the eyes or skin Side effects that usually do not require medical attention (report to your doctor or health care professional if they continue or are bothersome): -changes in nail or skin color -hair loss -missed menstrual periods -mouth sores -nausea, vomiting This list may not describe all possible side effects. Call your doctor for medical advice about side effects. You may report side effects to FDA at 1-800-FDA-1088. Where should I keep my medicine? This drug is given in a hospital or clinic and will not be stored at  home. NOTE: This sheet is a summary. It may not cover all possible information. If you have questions about this medicine, talk to your doctor, pharmacist, or health care provider.  2018 Elsevier/Gold Standard (2012-04-29 16:22:58)  Pegfilgrastim injection (Neulasta) What is this medicine? PEGFILGRASTIM (PEG fil gra stim) is a long-acting granulocyte colony-stimulating factor that stimulates the growth of neutrophils, a type of white blood cell important in the body's fight against infection. It is used to reduce the incidence of fever and infection in patients with certain types of cancer who are receiving chemotherapy that affects the bone marrow, and to increase survival after being exposed to high doses of radiation. This medicine may be used for other purposes; ask your health care provider or pharmacist if you have questions. COMMON BRAND NAME(S): Neulasta What should I tell my health care provider before I take this medicine? They need to know if you have any of these conditions: -kidney disease -latex allergy -ongoing radiation therapy -sickle cell disease -skin reactions to acrylic adhesives (On-Body Injector only) -an unusual or allergic reaction to pegfilgrastim, filgrastim, other medicines, foods, dyes, or preservatives -pregnant or trying to get pregnant -breast-feeding How  should I use this medicine? This medicine is for injection under the skin. If you get this medicine at home, you will be taught how to prepare and give the pre-filled syringe or how to use the On-body Injector. Refer to the patient Instructions for Use for detailed instructions. Use exactly as directed. Tell your healthcare provider immediately if you suspect that the On-body Injector may not have performed as intended or if you suspect the use of the On-body Injector resulted in a missed or partial dose. It is important that you put your used needles and syringes in a special sharps container. Do not put them  in a trash can. If you do not have a sharps container, call your pharmacist or healthcare provider to get one. Talk to your pediatrician regarding the use of this medicine in children. While this drug may be prescribed for selected conditions, precautions do apply. Overdosage: If you think you have taken too much of this medicine contact a poison control center or emergency room at once. NOTE: This medicine is only for you. Do not share this medicine with others. What if I miss a dose? It is important not to miss your dose. Call your doctor or health care professional if you miss your dose. If you miss a dose due to an On-body Injector failure or leakage, a new dose should be administered as soon as possible using a single prefilled syringe for manual use. What may interact with this medicine? Interactions have not been studied. Give your health care provider a list of all the medicines, herbs, non-prescription drugs, or dietary supplements you use. Also tell them if you smoke, drink alcohol, or use illegal drugs. Some items may interact with your medicine. This list may not describe all possible interactions. Give your health care provider a list of all the medicines, herbs, non-prescription drugs, or dietary supplements you use. Also tell them if you smoke, drink alcohol, or use illegal drugs. Some items may interact with your medicine. What should I watch for while using this medicine? You may need blood work done while you are taking this medicine. If you are going to need a MRI, CT scan, or other procedure, tell your doctor that you are using this medicine (On-Body Injector only). What side effects may I notice from receiving this medicine? Side effects that you should report to your doctor or health care professional as soon as possible: -allergic reactions like skin rash, itching or hives, swelling of the face, lips, or tongue -dizziness -fever -pain, redness, or irritation at site where  injected -pinpoint red spots on the skin -red or dark-brown urine -shortness of breath or breathing problems -stomach or side pain, or pain at the shoulder -swelling -tiredness -trouble passing urine or change in the amount of urine Side effects that usually do not require medical attention (report to your doctor or health care professional if they continue or are bothersome): -bone pain -muscle pain This list may not describe all possible side effects. Call your doctor for medical advice about side effects. You may report side effects to FDA at 1-800-FDA-1088. Where should I keep my medicine? Keep out of the reach of children. Store pre-filled syringes in a refrigerator between 2 and 8 degrees C (36 and 46 degrees F). Do not freeze. Keep in carton to protect from light. Throw away this medicine if it is left out of the refrigerator for more than 48 hours. Throw away any unused medicine after the expiration date. NOTE: This  sheet is a summary. It may not cover all possible information. If you have questions about this medicine, talk to your doctor, pharmacist, or health care provider.  2018 Elsevier/Gold Standard (2016-06-11 12:58:03)
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
## 2016-10-15 NOTE — Progress Notes (Signed)
Enrolled pt in the [employer] program.  Faxed signed form and activated card today.  Pt is also approved for the $1000 [employer], went over what it covers and gave her an expense sheet.

## 2016-10-16 ENCOUNTER — Telehealth: Payer: Self-pay | Admitting: Hematology

## 2016-10-16 NOTE — Telephone Encounter (Signed)
No LOS per 10/15/16 visit. °

## 2016-10-19 ENCOUNTER — Telehealth: Payer: Self-pay | Admitting: Hematology

## 2016-10-19 NOTE — Telephone Encounter (Signed)
Faxed completed disability forms to [employer] at fax # [phone] on 10/15/16 and scanned copy into Epic
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
## 2016-10-29 ENCOUNTER — Telehealth: Payer: Self-pay | Admitting: Hematology

## 2016-10-29 ENCOUNTER — Ambulatory Visit (HOSPITAL_BASED_OUTPATIENT_CLINIC_OR_DEPARTMENT_OTHER): Payer: BLUE CROSS/BLUE SHIELD | Admitting: Hematology

## 2016-10-29 ENCOUNTER — Ambulatory Visit (HOSPITAL_BASED_OUTPATIENT_CLINIC_OR_DEPARTMENT_OTHER): Payer: BLUE CROSS/BLUE SHIELD

## 2016-10-29 ENCOUNTER — Other Ambulatory Visit (HOSPITAL_BASED_OUTPATIENT_CLINIC_OR_DEPARTMENT_OTHER): Payer: BLUE CROSS/BLUE SHIELD

## 2016-10-29 ENCOUNTER — Ambulatory Visit: Payer: BLUE CROSS/BLUE SHIELD

## 2016-10-29 DIAGNOSIS — Z170 Estrogen receptor positive status [ER+]: Secondary | ICD-10-CM

## 2016-10-29 DIAGNOSIS — Z5111 Encounter for antineoplastic chemotherapy: Secondary | ICD-10-CM | POA: Diagnosis not present

## 2016-10-29 DIAGNOSIS — C50412 Malignant neoplasm of upper-outer quadrant of left female breast: Secondary | ICD-10-CM

## 2016-10-29 DIAGNOSIS — E039 Hypothyroidism, unspecified: Secondary | ICD-10-CM | POA: Diagnosis not present

## 2016-10-29 DIAGNOSIS — F329 Major depressive disorder, single episode, unspecified: Secondary | ICD-10-CM | POA: Diagnosis not present

## 2016-10-29 DIAGNOSIS — R634 Abnormal weight loss: Secondary | ICD-10-CM | POA: Diagnosis not present

## 2016-10-29 DIAGNOSIS — Z95828 Presence of other vascular implants and grafts: Secondary | ICD-10-CM

## 2016-10-29 LAB — CBC WITH DIFFERENTIAL/PLATELET
BASO%: 0.4 % (ref 0.0–2.0)
BASOS ABS: 0.1 10*3/uL (ref 0.0–0.1)
EOS ABS: 0 10*3/uL (ref 0.0–0.5)
EOS%: 0.1 % (ref 0.0–7.0)
HCT: 34.2 % — ABNORMAL LOW (ref 34.8–46.6)
HGB: 11.9 g/dL (ref 11.6–15.9)
LYMPH%: 10.8 % — AB (ref 14.0–49.7)
MCH: 30.6 pg (ref 25.1–34.0)
MCHC: 34.8 g/dL (ref 31.5–36.0)
MCV: 87.9 fL (ref 79.5–101.0)
MONO#: 1.3 10*3/uL — AB (ref 0.1–0.9)
MONO%: 9.6 % (ref 0.0–14.0)
NEUT%: 79.1 % — ABNORMAL HIGH (ref 38.4–76.8)
NEUTROS ABS: 11 10*3/uL — AB (ref 1.5–6.5)
PLATELETS: 167 10*3/uL (ref 145–400)
RBC: 3.89 10*6/uL (ref 3.70–5.45)
RDW: 14 % (ref 11.2–14.5)
WBC: 13.9 10*3/uL — ABNORMAL HIGH (ref 3.9–10.3)
lymph#: 1.5 10*3/uL (ref 0.9–3.3)
nRBC: 0 % (ref 0–0)

## 2016-10-29 LAB — COMPREHENSIVE METABOLIC PANEL
ALK PHOS: 122 U/L (ref 40–150)
ALT: 13 U/L (ref 0–55)
ANION GAP: 7 meq/L (ref 3–11)
AST: 22 U/L (ref 5–34)
Albumin: 4 g/dL (ref 3.5–5.0)
BILIRUBIN TOTAL: 0.22 mg/dL (ref 0.20–1.20)
BUN: 9.5 mg/dL (ref 7.0–26.0)
CALCIUM: 8.9 mg/dL (ref 8.4–10.4)
CO2: 26 mEq/L (ref 22–29)
CREATININE: 0.8 mg/dL (ref 0.6–1.1)
Chloride: 105 mEq/L (ref 98–109)
EGFR: 85 mL/min/{1.73_m2} — AB (ref >90)
Glucose: 133 mg/dl (ref 70–140)
Potassium: 4 mEq/L (ref 3.5–5.1)
Sodium: 138 mEq/L (ref 136–145)
Total Protein: 6.7 g/dL (ref 6.4–8.3)

## 2016-10-29 MED ORDER — SODIUM CHLORIDE 0.9 % IV SOLN
Freq: Once | INTRAVENOUS | Status: AC
  Administered 2016-10-29: 13:00:00 via INTRAVENOUS
  Filled 2016-10-29: qty 5

## 2016-10-29 MED ORDER — SODIUM CHLORIDE 0.9% FLUSH
10.0000 mL | INTRAVENOUS | Status: DC | PRN
  Administered 2016-10-29: 10 mL
  Filled 2016-10-29: qty 10

## 2016-10-29 MED ORDER — PALONOSETRON HCL INJECTION 0.25 MG/5ML
INTRAVENOUS | Status: AC
  Filled 2016-10-29: qty 5

## 2016-10-29 MED ORDER — SODIUM CHLORIDE 0.9% FLUSH
10.0000 mL | Freq: Once | INTRAVENOUS | Status: AC
  Administered 2016-10-29: 10 mL
  Filled 2016-10-29: qty 10

## 2016-10-29 MED ORDER — SODIUM CHLORIDE 0.9 % IV SOLN
600.0000 mg/m2 | Freq: Once | INTRAVENOUS | Status: AC
  Administered 2016-10-29: 960 mg via INTRAVENOUS
  Filled 2016-10-29: qty 48

## 2016-10-29 MED ORDER — DOXORUBICIN HCL CHEMO IV INJECTION 2 MG/ML
60.0000 mg/m2 | Freq: Once | INTRAVENOUS | Status: AC
  Administered 2016-10-29: 96 mg via INTRAVENOUS
  Filled 2016-10-29: qty 48

## 2016-10-29 MED ORDER — PALONOSETRON HCL INJECTION 0.25 MG/5ML
0.2500 mg | Freq: Once | INTRAVENOUS | Status: AC
  Administered 2016-10-29: 0.25 mg via INTRAVENOUS

## 2016-10-29 MED ORDER — HEPARIN SOD (PORK) LOCK FLUSH 100 UNIT/ML IV SOLN
500.0000 [IU] | Freq: Once | INTRAVENOUS | Status: AC | PRN
  Administered 2016-10-29: 500 [IU]
  Filled 2016-10-29: qty 5

## 2016-10-29 MED ORDER — PEGFILGRASTIM 6 MG/0.6ML ~~LOC~~ PSKT
6.0000 mg | PREFILLED_SYRINGE | Freq: Once | SUBCUTANEOUS | Status: AC
  Administered 2016-10-29: 6 mg via SUBCUTANEOUS
  Filled 2016-10-29: qty 0.6

## 2016-10-29 MED ORDER — SODIUM CHLORIDE 0.9 % IV SOLN
Freq: Once | INTRAVENOUS | Status: AC
  Administered 2016-10-29: 13:00:00 via INTRAVENOUS

## 2016-10-29 NOTE — Telephone Encounter (Signed)
Appointments scheduled per 5.3.18 LOS. Patient given AVS report and calendars with future scheduled appointments. °

## 2016-10-29 NOTE — Patient Instructions (Signed)
[facility] Discharge Instructions for Patients Receiving Chemotherapy  Today you received the following chemotherapy agents Adriamycin and Cytoxan  To help prevent nausea and vomiting after your treatment, we encourage you to take your nausea medication as instructed by your MD.   If you develop nausea and vomiting that is not controlled by your nausea medication, call the clinic.   BELOW ARE SYMPTOMS THAT SHOULD BE REPORTED IMMEDIATELY:  *FEVER GREATER THAN 100.5 F  *CHILLS WITH OR WITHOUT FEVER  NAUSEA AND VOMITING THAT IS NOT CONTROLLED WITH YOUR NAUSEA MEDICATION  *UNUSUAL SHORTNESS OF BREATH  *UNUSUAL BRUISING OR BLEEDING  TENDERNESS IN MOUTH AND THROAT WITH OR WITHOUT PRESENCE OF ULCERS  *URINARY PROBLEMS  *BOWEL PROBLEMS  UNUSUAL RASH Items with * indicate a potential emergency and should be followed up as soon as possible.  Feel free to call the clinic you have any questions or concerns. The clinic phone number is [phone].  Please show the [address] at check-in to the Emergency Department and triage nurse.

## 2016-10-31 ENCOUNTER — Encounter: Payer: Self-pay | Admitting: Hematology
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
## 2016-11-12 ENCOUNTER — Ambulatory Visit (HOSPITAL_BASED_OUTPATIENT_CLINIC_OR_DEPARTMENT_OTHER): Payer: BLUE CROSS/BLUE SHIELD

## 2016-11-12 ENCOUNTER — Ambulatory Visit: Payer: BLUE CROSS/BLUE SHIELD

## 2016-11-12 ENCOUNTER — Encounter: Payer: Self-pay | Admitting: Hematology

## 2016-11-12 ENCOUNTER — Other Ambulatory Visit (HOSPITAL_BASED_OUTPATIENT_CLINIC_OR_DEPARTMENT_OTHER): Payer: BLUE CROSS/BLUE SHIELD

## 2016-11-12 ENCOUNTER — Ambulatory Visit (HOSPITAL_BASED_OUTPATIENT_CLINIC_OR_DEPARTMENT_OTHER): Payer: BLUE CROSS/BLUE SHIELD | Admitting: Hematology

## 2016-11-12 VITALS — BP 117/65 | HR 78 | Temp 97.7°F | Resp 17 | Ht 64.75 in | Wt 119.2 lb

## 2016-11-12 DIAGNOSIS — R634 Abnormal weight loss: Secondary | ICD-10-CM

## 2016-11-12 DIAGNOSIS — Z170 Estrogen receptor positive status [ER+]: Secondary | ICD-10-CM | POA: Diagnosis not present

## 2016-11-12 DIAGNOSIS — R439 Unspecified disturbances of smell and taste: Secondary | ICD-10-CM

## 2016-11-12 DIAGNOSIS — E039 Hypothyroidism, unspecified: Secondary | ICD-10-CM

## 2016-11-12 DIAGNOSIS — Z5111 Encounter for antineoplastic chemotherapy: Secondary | ICD-10-CM | POA: Diagnosis not present

## 2016-11-12 DIAGNOSIS — C50412 Malignant neoplasm of upper-outer quadrant of left female breast: Secondary | ICD-10-CM

## 2016-11-12 DIAGNOSIS — Z95828 Presence of other vascular implants and grafts: Secondary | ICD-10-CM

## 2016-11-12 DIAGNOSIS — C773 Secondary and unspecified malignant neoplasm of axilla and upper limb lymph nodes: Secondary | ICD-10-CM

## 2016-11-12 DIAGNOSIS — F329 Major depressive disorder, single episode, unspecified: Secondary | ICD-10-CM | POA: Diagnosis not present

## 2016-11-12 LAB — CBC WITH DIFFERENTIAL/PLATELET
BASO%: 0.4 % (ref 0.0–2.0)
BASOS ABS: 0.1 10*3/uL (ref 0.0–0.1)
EOS ABS: 0 10*3/uL (ref 0.0–0.5)
EOS%: 0.1 % (ref 0.0–7.0)
HCT: 34.4 % — ABNORMAL LOW (ref 34.8–46.6)
HEMOGLOBIN: 11.5 g/dL — AB (ref 11.6–15.9)
LYMPH#: 1.4 10*3/uL (ref 0.9–3.3)
LYMPH%: 5.5 % — ABNORMAL LOW (ref 14.0–49.7)
MCH: 30.4 pg (ref 25.1–34.0)
MCHC: 33.5 g/dL (ref 31.5–36.0)
MCV: 90.7 fL (ref 79.5–101.0)
MONO#: 1.1 10*3/uL — ABNORMAL HIGH (ref 0.1–0.9)
MONO%: 4.3 % (ref 0.0–14.0)
NEUT#: 23 10*3/uL — ABNORMAL HIGH (ref 1.5–6.5)
NEUT%: 89.7 % — AB (ref 38.4–76.8)
PLATELETS: 188 10*3/uL (ref 145–400)
RBC: 3.79 10*6/uL (ref 3.70–5.45)
RDW: 15 % — ABNORMAL HIGH (ref 11.2–14.5)
WBC: 25.7 10*3/uL — ABNORMAL HIGH (ref 3.9–10.3)

## 2016-11-12 LAB — COMPREHENSIVE METABOLIC PANEL
ALBUMIN: 4 g/dL (ref 3.5–5.0)
ALK PHOS: 134 U/L (ref 40–150)
ALT: 29 U/L (ref 0–55)
ANION GAP: 9 meq/L (ref 3–11)
AST: 37 U/L — AB (ref 5–34)
BUN: 8.9 mg/dL (ref 7.0–26.0)
CALCIUM: 8.8 mg/dL (ref 8.4–10.4)
CHLORIDE: 105 meq/L (ref 98–109)
CO2: 25 mEq/L (ref 22–29)
Creatinine: 0.7 mg/dL (ref 0.6–1.1)
EGFR: 89 mL/min/{1.73_m2} — AB (ref >90)
Glucose: 152 mg/dl — ABNORMAL HIGH (ref 70–140)
POTASSIUM: 3.9 meq/L (ref 3.5–5.1)
Sodium: 138 mEq/L (ref 136–145)
Total Bilirubin: 0.25 mg/dL (ref 0.20–1.20)
Total Protein: 6.6 g/dL (ref 6.4–8.3)

## 2016-11-12 MED ORDER — DOXORUBICIN HCL CHEMO IV INJECTION 2 MG/ML
60.0000 mg/m2 | Freq: Once | INTRAVENOUS | Status: AC
  Administered 2016-11-12: 96 mg via INTRAVENOUS
  Filled 2016-11-12: qty 48

## 2016-11-12 MED ORDER — SODIUM CHLORIDE 0.9 % IV SOLN
Freq: Once | INTRAVENOUS | Status: AC
  Administered 2016-11-12: 13:00:00 via INTRAVENOUS
  Filled 2016-11-12: qty 5

## 2016-11-12 MED ORDER — SODIUM CHLORIDE 0.9% FLUSH
10.0000 mL | INTRAVENOUS | Status: DC | PRN
  Administered 2016-11-12: 10 mL
  Filled 2016-11-12: qty 10

## 2016-11-12 MED ORDER — PALONOSETRON HCL INJECTION 0.25 MG/5ML
INTRAVENOUS | Status: AC
  Filled 2016-11-12: qty 5

## 2016-11-12 MED ORDER — SODIUM CHLORIDE 0.9 % IV SOLN
600.0000 mg/m2 | Freq: Once | INTRAVENOUS | Status: AC
  Administered 2016-11-12: 960 mg via INTRAVENOUS
  Filled 2016-11-12: qty 48

## 2016-11-12 MED ORDER — HEPARIN SOD (PORK) LOCK FLUSH 100 UNIT/ML IV SOLN
500.0000 [IU] | Freq: Once | INTRAVENOUS | Status: AC | PRN
  Administered 2016-11-12: 500 [IU]
  Filled 2016-11-12: qty 5

## 2016-11-12 MED ORDER — SODIUM CHLORIDE 0.9 % IV SOLN
Freq: Once | INTRAVENOUS | Status: AC
  Administered 2016-11-12: 13:00:00 via INTRAVENOUS

## 2016-11-12 MED ORDER — SODIUM CHLORIDE 0.9% FLUSH
10.0000 mL | Freq: Once | INTRAVENOUS | Status: AC
  Administered 2016-11-12: 10 mL
  Filled 2016-11-12: qty 10

## 2016-11-12 MED ORDER — PEGFILGRASTIM 6 MG/0.6ML ~~LOC~~ PSKT
6.0000 mg | PREFILLED_SYRINGE | Freq: Once | SUBCUTANEOUS | Status: AC
  Administered 2016-11-12: 6 mg via SUBCUTANEOUS
  Filled 2016-11-12: qty 0.6

## 2016-11-12 MED ORDER — PALONOSETRON HCL INJECTION 0.25 MG/5ML
0.2500 mg | Freq: Once | INTRAVENOUS | Status: AC
  Administered 2016-11-12: 0.25 mg via INTRAVENOUS

## 2016-11-18 ENCOUNTER — Telehealth: Payer: Self-pay | Admitting: Hematology

## 2016-11-18 NOTE — Telephone Encounter (Signed)
No los per 11/12/16 visit. °

## 2016-11-26 ENCOUNTER — Ambulatory Visit: Payer: BLUE CROSS/BLUE SHIELD

## 2016-11-26 ENCOUNTER — Ambulatory Visit (HOSPITAL_BASED_OUTPATIENT_CLINIC_OR_DEPARTMENT_OTHER): Payer: BLUE CROSS/BLUE SHIELD

## 2016-11-26 ENCOUNTER — Other Ambulatory Visit (HOSPITAL_BASED_OUTPATIENT_CLINIC_OR_DEPARTMENT_OTHER): Payer: BLUE CROSS/BLUE SHIELD

## 2016-11-26 VITALS — BP 119/72 | HR 85 | Temp 97.6°F | Resp 17

## 2016-11-26 DIAGNOSIS — C773 Secondary and unspecified malignant neoplasm of axilla and upper limb lymph nodes: Secondary | ICD-10-CM | POA: Diagnosis not present

## 2016-11-26 DIAGNOSIS — C50412 Malignant neoplasm of upper-outer quadrant of left female breast: Secondary | ICD-10-CM

## 2016-11-26 DIAGNOSIS — Z95828 Presence of other vascular implants and grafts: Secondary | ICD-10-CM

## 2016-11-26 DIAGNOSIS — Z5111 Encounter for antineoplastic chemotherapy: Secondary | ICD-10-CM

## 2016-11-26 DIAGNOSIS — Z170 Estrogen receptor positive status [ER+]: Secondary | ICD-10-CM

## 2016-11-26 LAB — COMPREHENSIVE METABOLIC PANEL
ALK PHOS: 169 U/L — AB (ref 40–150)
ALT: 25 U/L (ref 0–55)
AST: 30 U/L (ref 5–34)
Albumin: 4 g/dL (ref 3.5–5.0)
Anion Gap: 10 mEq/L (ref 3–11)
BUN: 9.6 mg/dL (ref 7.0–26.0)
CALCIUM: 9.1 mg/dL (ref 8.4–10.4)
CO2: 25 mEq/L (ref 22–29)
Chloride: 106 mEq/L (ref 98–109)
Creatinine: 0.7 mg/dL (ref 0.6–1.1)
EGFR: >90 mL/min/{1.73_m2} (ref >90)
Glucose: 123 mg/dl (ref 70–140)
POTASSIUM: 4 meq/L (ref 3.5–5.1)
Sodium: 141 mEq/L (ref 136–145)
Total Bilirubin: <0.22 mg/dL (ref 0.20–1.20)
Total Protein: 6.8 g/dL (ref 6.4–8.3)

## 2016-11-26 LAB — CBC WITH DIFFERENTIAL/PLATELET
BASO%: 0.3 % (ref 0.0–2.0)
BASOS ABS: 0.1 10*3/uL (ref 0.0–0.1)
EOS ABS: 0.1 10*3/uL (ref 0.0–0.5)
EOS%: 0.3 % (ref 0.0–7.0)
HEMATOCRIT: 33.3 % — AB (ref 34.8–46.6)
HEMOGLOBIN: 11.1 g/dL — AB (ref 11.6–15.9)
LYMPH%: 5.1 % — ABNORMAL LOW (ref 14.0–49.7)
MCH: 31.4 pg (ref 25.1–34.0)
MCHC: 33.3 g/dL (ref 31.5–36.0)
MCV: 94.3 fL (ref 79.5–101.0)
MONO#: 2.6 10*3/uL — ABNORMAL HIGH (ref 0.1–0.9)
MONO%: 11.1 % (ref 0.0–14.0)
NEUT#: 19.2 10*3/uL — ABNORMAL HIGH (ref 1.5–6.5)
NEUT%: 83.2 % — ABNORMAL HIGH (ref 38.4–76.8)
Platelets: 142 10*3/uL — ABNORMAL LOW (ref 145–400)
RBC: 3.53 10*6/uL — ABNORMAL LOW (ref 3.70–5.45)
RDW: 16 % — AB (ref 11.2–14.5)
WBC: 23.1 10*3/uL — ABNORMAL HIGH (ref 3.9–10.3)
lymph#: 1.2 10*3/uL (ref 0.9–3.3)

## 2016-11-26 MED ORDER — FAMOTIDINE IN NACL 20-0.9 MG/50ML-% IV SOLN
20.0000 mg | Freq: Once | INTRAVENOUS | Status: AC
  Administered 2016-11-26: 20 mg via INTRAVENOUS

## 2016-11-26 MED ORDER — DIPHENHYDRAMINE HCL 50 MG/ML IJ SOLN
50.0000 mg | Freq: Once | INTRAMUSCULAR | Status: AC
Start: 2016-11-26 — End: 2016-11-26
  Administered 2016-11-26: 50 mg via INTRAVENOUS

## 2016-11-26 MED ORDER — FAMOTIDINE IN NACL 20-0.9 MG/50ML-% IV SOLN
INTRAVENOUS | Status: AC
  Filled 2016-11-26: qty 50

## 2016-11-26 MED ORDER — SODIUM CHLORIDE 0.9 % IV SOLN
Freq: Once | INTRAVENOUS | Status: AC
  Administered 2016-11-26: 10:00:00 via INTRAVENOUS

## 2016-11-26 MED ORDER — SODIUM CHLORIDE 0.9 % IV SOLN
20.0000 mg | Freq: Once | INTRAVENOUS | Status: AC
  Administered 2016-11-26: 20 mg via INTRAVENOUS
  Filled 2016-11-26: qty 2

## 2016-11-26 MED ORDER — PACLITAXEL PROTEIN-BOUND CHEMO INJECTION 100 MG: 80.0000 mg/m2 | Freq: Once | INTRAVENOUS | Status: DC

## 2016-11-26 MED ORDER — PACLITAXEL CHEMO INJECTION 300 MG/50ML
80.0000 mg/m2 | Freq: Once | INTRAVENOUS | Status: AC
  Administered 2016-11-26: 126 mg via INTRAVENOUS
  Filled 2016-11-26: qty 21

## 2016-11-26 MED ORDER — HEPARIN SOD (PORK) LOCK FLUSH 100 UNIT/ML IV SOLN
500.0000 [IU] | Freq: Once | INTRAVENOUS | Status: AC | PRN
  Administered 2016-11-26: 500 [IU]
  Filled 2016-11-26: qty 5

## 2016-11-26 MED ORDER — SODIUM CHLORIDE 0.9% FLUSH
10.0000 mL | INTRAVENOUS | Status: DC | PRN
  Administered 2016-11-26: 10 mL
  Filled 2016-11-26: qty 10

## 2016-11-26 MED ORDER — SODIUM CHLORIDE 0.9% FLUSH
10.0000 mL | Freq: Once | INTRAVENOUS | Status: DC
  Filled 2016-11-26: qty 10

## 2016-11-26 MED ORDER — DIPHENHYDRAMINE HCL 50 MG/ML IJ SOLN
INTRAMUSCULAR | Status: AC
  Filled 2016-11-26: qty 1

## 2016-11-26 NOTE — Patient Instructions (Signed)
Implanted Port Home Guide An implanted port is a type of central line that is placed under the skin. Central lines are used to provide IV access when treatment or nutrition needs to be given through a person's veins. Implanted ports are used for long-term IV access. An implanted port may be placed because:  You need IV medicine that would be irritating to the small veins in your hands or arms.  You need long-term IV medicines, such as antibiotics.  You need IV nutrition for a long period.  You need frequent blood draws for lab tests.  You need dialysis.  Implanted ports are usually placed in the chest area, but they can also be placed in the upper arm, the abdomen, or the leg. An implanted port has two main parts:  Reservoir. The reservoir is round and will appear as a small, raised area under your skin. The reservoir is the part where a needle is inserted to give medicines or draw blood.  Catheter. The catheter is a thin, flexible tube that extends from the reservoir. The catheter is placed into a large vein. Medicine that is inserted into the reservoir goes into the catheter and then into the vein.  How will I care for my incision site? Do not get the incision site wet. Bathe or shower as directed by your health care provider. How is my port accessed? Special steps must be taken to access the port:  Before the port is accessed, a numbing cream can be placed on the skin. This helps numb the skin over the port site.  Your health care provider uses a sterile technique to access the port. ? Your health care provider must put on a mask and sterile gloves. ? The skin over your port is cleaned carefully with an antiseptic and allowed to dry. ? The port is gently pinched between sterile gloves, and a needle is inserted into the port.  Only "non-coring" port needles should be used to access the port. Once the port is accessed, a blood return should be checked. This helps ensure that the port  is in the vein and is not clogged.  If your port needs to remain accessed for a constant infusion, a clear (transparent) bandage will be placed over the needle site. The bandage and needle will need to be changed every week, or as directed by your health care provider.  Keep the bandage covering the needle clean and dry. Do not get it wet. Follow your health care provider's instructions on how to take a shower or bath while the port is accessed.  If your port does not need to stay accessed, no bandage is needed over the port.  What is flushing? Flushing helps keep the port from getting clogged. Follow your health care provider's instructions on how and when to flush the port. Ports are usually flushed with saline solution or a medicine called heparin. The need for flushing will depend on how the port is used.  If the port is used for intermittent medicines or blood draws, the port will need to be flushed: ? After medicines have been given. ? After blood has been drawn. ? As part of routine maintenance.  If a constant infusion is running, the port may not need to be flushed.  How long will my port stay implanted? The port can stay in for as long as your health care provider thinks it is needed. When it is time for the port to come out, surgery will be   done to remove it. The procedure is similar to the one performed when the port was put in. When should I seek immediate medical care? When you have an implanted port, you should seek immediate medical care if:  You notice a bad smell coming from the incision site.  You have swelling, redness, or drainage at the incision site.  You have more swelling or pain at the port site or the surrounding area.  You have a fever that is not controlled with medicine.  This information is not intended to replace advice given to you by your health care provider. Make sure you discuss any questions you have with your health care provider. Document  Released: 06/15/2005 Document Revised: 11/21/2015 Document Reviewed: 02/20/2013 Elsevier Interactive Patient Education  2017 Elsevier Inc.  

## 2016-11-26 NOTE — Patient Instructions (Addendum)
[facility] Discharge Instructions for Patients Receiving Chemotherapy  Today you received the following chemotherapy agents Taxol  To help prevent nausea and vomiting after your treatment, we encourage you to take your nausea medication   If you develop nausea and vomiting that is not controlled by your nausea medication, call the clinic.   BELOW ARE SYMPTOMS THAT SHOULD BE REPORTED IMMEDIATELY:  *FEVER GREATER THAN 100.5 F  *CHILLS WITH OR WITHOUT FEVER  NAUSEA AND VOMITING THAT IS NOT CONTROLLED WITH YOUR NAUSEA MEDICATION  *UNUSUAL SHORTNESS OF BREATH  *UNUSUAL BRUISING OR BLEEDING  TENDERNESS IN MOUTH AND THROAT WITH OR WITHOUT PRESENCE OF ULCERS  *URINARY PROBLEMS  *BOWEL PROBLEMS  UNUSUAL RASH Items with * indicate a potential emergency and should be followed up as soon as possible.  Feel free to call the clinic you have any questions or concerns. The clinic phone number is [phone].  Please show the [place] at check-in to the Emergency Department and triage nurse.    Paclitaxel injection What is this medicine? PACLITAXEL (PAK li TAX el) is a chemotherapy drug. It targets fast dividing cells, like cancer cells, and causes these cells to die. This medicine is used to treat ovarian cancer, breast cancer, and other cancers. This medicine may be used for other purposes; ask your health care provider or pharmacist if you have questions. COMMON BRAND NAME(S): Onxol, Taxol What should I tell my health care provider before I take this medicine? They need to know if you have any of these conditions: -blood disorders -irregular heartbeat -infection (especially a virus infection such as chickenpox, cold sores, or herpes) -liver disease -previous or ongoing radiation therapy -an unusual or allergic reaction to paclitaxel, alcohol, polyoxyethylated castor oil, other chemotherapy agents, other medicines, foods, dyes, or preservatives -pregnant or  trying to get pregnant -breast-feeding How should I use this medicine? This drug is given as an infusion into a vein. It is administered in a hospital or clinic by a specially trained health care professional. Talk to your pediatrician regarding the use of this medicine in children. Special care may be needed. Overdosage: If you think you have taken too much of this medicine contact a poison control center or emergency room at once. NOTE: This medicine is only for you. Do not share this medicine with others. What if I miss a dose? It is important not to miss your dose. Call your doctor or health care professional if you are unable to keep an appointment. What may interact with this medicine? Do not take this medicine with any of the following medications: -disulfiram -metronidazole This medicine may also interact with the following medications: -cyclosporine -diazepam -ketoconazole -medicines to increase blood counts like filgrastim, pegfilgrastim, sargramostim -other chemotherapy drugs like cisplatin, doxorubicin, epirubicin, etoposide, teniposide, vincristine -quinidine -testosterone -vaccines -verapamil Talk to your doctor or health care professional before taking any of these medicines: -acetaminophen -aspirin -ibuprofen -ketoprofen -naproxen This list may not describe all possible interactions. Give your health care provider a list of all the medicines, herbs, non-prescription drugs, or dietary supplements you use. Also tell them if you smoke, drink alcohol, or use illegal drugs. Some items may interact with your medicine. What should I watch for while using this medicine? Your condition will be monitored carefully while you are receiving this medicine. You will need important blood work done while you are taking this medicine. This medicine can cause serious allergic reactions. To reduce your risk you will need to take other  medicine(s) before treatment with this medicine. If  you experience allergic reactions like skin rash, itching or hives, swelling of the face, lips, or tongue, tell your doctor or health care professional right away. In some cases, you may be given additional medicines to help with side effects. Follow all directions for their use. This drug may make you feel generally unwell. This is not uncommon, as chemotherapy can affect healthy cells as well as cancer cells. Report any side effects. Continue your course of treatment even though you feel ill unless your doctor tells you to stop. Call your doctor or health care professional for advice if you get a fever, chills or sore throat, or other symptoms of a cold or flu. Do not treat yourself. This drug decreases your body's ability to fight infections. Try to avoid being around people who are sick. This medicine may increase your risk to bruise or bleed. Call your doctor or health care professional if you notice any unusual bleeding. Be careful brushing and flossing your teeth or using a toothpick because you may get an infection or bleed more easily. If you have any dental work done, tell your dentist you are receiving this medicine. Avoid taking products that contain aspirin, acetaminophen, ibuprofen, naproxen, or ketoprofen unless instructed by your doctor. These medicines may hide a fever. Do not become pregnant while taking this medicine. Women should inform their doctor if they wish to become pregnant or think they might be pregnant. There is a potential for serious side effects to an unborn child. Talk to your health care professional or pharmacist for more information. Do not breast-feed an infant while taking this medicine. Men are advised not to father a child while receiving this medicine. This product may contain alcohol. Ask your pharmacist or healthcare provider if this medicine contains alcohol. Be sure to tell all healthcare providers you are taking this medicine. Certain medicines, like  metronidazole and disulfiram, can cause an unpleasant reaction when taken with alcohol. The reaction includes flushing, headache, nausea, vomiting, sweating, and increased thirst. The reaction can last from 30 minutes to several hours. What side effects may I notice from receiving this medicine? Side effects that you should report to your doctor or health care professional as soon as possible: -allergic reactions like skin rash, itching or hives, swelling of the face, lips, or tongue -low blood counts - This drug may decrease the number of white blood cells, red blood cells and platelets. You may be at increased risk for infections and bleeding. -signs of infection - fever or chills, cough, sore throat, pain or difficulty passing urine -signs of decreased platelets or bleeding - bruising, pinpoint red spots on the skin, black, tarry stools, nosebleeds -signs of decreased red blood cells - unusually weak or tired, fainting spells, lightheadedness -breathing problems -chest pain -high or low blood pressure -mouth sores -nausea and vomiting -pain, swelling, redness or irritation at the injection site -pain, tingling, numbness in the hands or feet -slow or irregular heartbeat -swelling of the ankle, feet, hands Side effects that usually do not require medical attention (report to your doctor or health care professional if they continue or are bothersome): -bone pain -complete hair loss including hair on your head, underarms, pubic hair, eyebrows, and eyelashes -changes in the color of fingernails -diarrhea -loosening of the fingernails -loss of appetite -muscle or joint pain -red flush to skin -sweating This list may not describe all possible side effects. Call your doctor for medical advice about side effects.  You may report side effects to FDA at 1-800-FDA-1088. Where should I keep my medicine? This drug is given in a hospital or clinic and will not be stored at home. NOTE: This sheet is  a summary. It may not cover all possible information. If you have questions about this medicine, talk to your doctor, pharmacist, or health care provider.  2018 Elsevier/Gold Standard (2015-04-16 19:58:00)
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
## 2016-11-30 ENCOUNTER — Telehealth: Payer: Self-pay | Admitting: *Deleted

## 2016-11-30 NOTE — Telephone Encounter (Signed)
Left message at pt's ph # to call back with update on how she did with her chemotherapy.

## 2016-11-30 NOTE — Telephone Encounter (Signed)
-----   Message from Azzie Glatter, RN sent at 11/26/2016  3:47 PM EDT ----- Regarding: "1st time chemotherapy---per Dr. Burr Medico" Patient received Taxol for the the first time today, per Dr. Burr Medico. Tolerated treatment well with no complaints.

## 2016-12-03 ENCOUNTER — Ambulatory Visit (HOSPITAL_BASED_OUTPATIENT_CLINIC_OR_DEPARTMENT_OTHER): Payer: BLUE CROSS/BLUE SHIELD | Admitting: Hematology

## 2016-12-03 ENCOUNTER — Ambulatory Visit: Payer: BLUE CROSS/BLUE SHIELD | Admitting: Nutrition

## 2016-12-03 ENCOUNTER — Encounter: Payer: Self-pay | Admitting: Hematology

## 2016-12-03 ENCOUNTER — Encounter: Payer: Self-pay | Admitting: *Deleted

## 2016-12-03 ENCOUNTER — Other Ambulatory Visit (HOSPITAL_BASED_OUTPATIENT_CLINIC_OR_DEPARTMENT_OTHER): Payer: BLUE CROSS/BLUE SHIELD

## 2016-12-03 ENCOUNTER — Ambulatory Visit: Payer: BLUE CROSS/BLUE SHIELD

## 2016-12-03 ENCOUNTER — Ambulatory Visit (HOSPITAL_BASED_OUTPATIENT_CLINIC_OR_DEPARTMENT_OTHER): Payer: BLUE CROSS/BLUE SHIELD

## 2016-12-03 VITALS — BP 114/60 | HR 91 | Temp 97.6°F | Resp 18 | Ht 64.75 in | Wt 117.7 lb

## 2016-12-03 DIAGNOSIS — E039 Hypothyroidism, unspecified: Secondary | ICD-10-CM

## 2016-12-03 DIAGNOSIS — Z170 Estrogen receptor positive status [ER+]: Secondary | ICD-10-CM | POA: Diagnosis not present

## 2016-12-03 DIAGNOSIS — Z95828 Presence of other vascular implants and grafts: Secondary | ICD-10-CM

## 2016-12-03 DIAGNOSIS — R634 Abnormal weight loss: Secondary | ICD-10-CM | POA: Diagnosis not present

## 2016-12-03 DIAGNOSIS — C50412 Malignant neoplasm of upper-outer quadrant of left female breast: Secondary | ICD-10-CM

## 2016-12-03 DIAGNOSIS — Z5111 Encounter for antineoplastic chemotherapy: Secondary | ICD-10-CM | POA: Diagnosis not present

## 2016-12-03 DIAGNOSIS — R439 Unspecified disturbances of smell and taste: Secondary | ICD-10-CM | POA: Diagnosis not present

## 2016-12-03 DIAGNOSIS — C773 Secondary and unspecified malignant neoplasm of axilla and upper limb lymph nodes: Secondary | ICD-10-CM

## 2016-12-03 DIAGNOSIS — F329 Major depressive disorder, single episode, unspecified: Secondary | ICD-10-CM | POA: Diagnosis not present

## 2016-12-03 LAB — CBC WITH DIFFERENTIAL/PLATELET
BASO%: 0.9 % (ref 0.0–2.0)
Basophils Absolute: 0 10*3/uL (ref 0.0–0.1)
EOS ABS: 0 10*3/uL (ref 0.0–0.5)
EOS%: 0.9 % (ref 0.0–7.0)
HEMATOCRIT: 27.9 % — AB (ref 34.8–46.6)
HEMOGLOBIN: 9.8 g/dL — AB (ref 11.6–15.9)
LYMPH#: 0.5 10*3/uL — AB (ref 0.9–3.3)
LYMPH%: 11 % — ABNORMAL LOW (ref 14.0–49.7)
MCH: 32.1 pg (ref 25.1–34.0)
MCHC: 35.1 g/dL (ref 31.5–36.0)
MCV: 91.6 fL (ref 79.5–101.0)
MONO#: 0.6 10*3/uL (ref 0.1–0.9)
MONO%: 12.7 % (ref 0.0–14.0)
NEUT%: 74.5 % (ref 38.4–76.8)
NEUTROS ABS: 3.6 10*3/uL (ref 1.5–6.5)
PLATELETS: 285 10*3/uL (ref 145–400)
RBC: 3.05 10*6/uL — ABNORMAL LOW (ref 3.70–5.45)
RDW: 16.1 % — AB (ref 11.2–14.5)
WBC: 4.8 10*3/uL (ref 3.9–10.3)

## 2016-12-03 LAB — COMPREHENSIVE METABOLIC PANEL
ALBUMIN: 3.9 g/dL (ref 3.5–5.0)
ALK PHOS: 88 U/L (ref 40–150)
ALT: 28 U/L (ref 0–55)
ANION GAP: 11 meq/L (ref 3–11)
AST: 33 U/L (ref 5–34)
BILIRUBIN TOTAL: 0.46 mg/dL (ref 0.20–1.20)
BUN: 9.1 mg/dL (ref 7.0–26.0)
CO2: 25 mEq/L (ref 22–29)
CREATININE: 0.7 mg/dL (ref 0.6–1.1)
Calcium: 8.8 mg/dL (ref 8.4–10.4)
Chloride: 106 mEq/L (ref 98–109)
Glucose: 155 mg/dl — ABNORMAL HIGH (ref 70–140)
Potassium: 3.6 mEq/L (ref 3.5–5.1)
Sodium: 141 mEq/L (ref 136–145)
TOTAL PROTEIN: 6.3 g/dL — AB (ref 6.4–8.3)

## 2016-12-03 MED ORDER — DEXAMETHASONE SODIUM PHOSPHATE 10 MG/ML IJ SOLN
10.0000 mg | Freq: Once | INTRAMUSCULAR | Status: AC
  Administered 2016-12-03: 10 mg via INTRAVENOUS

## 2016-12-03 MED ORDER — SODIUM CHLORIDE 0.9% FLUSH
10.0000 mL | Freq: Once | INTRAVENOUS | Status: AC
  Administered 2016-12-03: 10 mL
  Filled 2016-12-03: qty 10

## 2016-12-03 MED ORDER — DIPHENHYDRAMINE HCL 50 MG/ML IJ SOLN
INTRAMUSCULAR | Status: AC
  Filled 2016-12-03: qty 1

## 2016-12-03 MED ORDER — FAMOTIDINE IN NACL 20-0.9 MG/50ML-% IV SOLN
20.0000 mg | Freq: Once | INTRAVENOUS | Status: AC
  Administered 2016-12-03: 20 mg via INTRAVENOUS

## 2016-12-03 MED ORDER — DIPHENHYDRAMINE HCL 50 MG/ML IJ SOLN
25.0000 mg | Freq: Once | INTRAMUSCULAR | Status: AC
  Administered 2016-12-03: 25 mg via INTRAVENOUS

## 2016-12-03 MED ORDER — DEXAMETHASONE SODIUM PHOSPHATE 10 MG/ML IJ SOLN
INTRAMUSCULAR | Status: AC
  Filled 2016-12-03: qty 1

## 2016-12-03 MED ORDER — HEPARIN SOD (PORK) LOCK FLUSH 100 UNIT/ML IV SOLN
500.0000 [IU] | Freq: Once | INTRAVENOUS | Status: AC | PRN
  Administered 2016-12-03: 500 [IU]
  Filled 2016-12-03: qty 5

## 2016-12-03 MED ORDER — SODIUM CHLORIDE 0.9 % IV SOLN
Freq: Once | INTRAVENOUS | Status: AC
  Administered 2016-12-03: 10:00:00 via INTRAVENOUS

## 2016-12-03 MED ORDER — SODIUM CHLORIDE 0.9% FLUSH
10.0000 mL | INTRAVENOUS | Status: DC | PRN
  Administered 2016-12-03: 10 mL
  Filled 2016-12-03: qty 10

## 2016-12-03 MED ORDER — DEXAMETHASONE SODIUM PHOSPHATE 100 MG/10ML IJ SOLN: 10.0000 mg | Freq: Once | INTRAMUSCULAR | Status: DC

## 2016-12-03 MED ORDER — PACLITAXEL CHEMO INJECTION 300 MG/50ML
80.0000 mg/m2 | Freq: Once | INTRAVENOUS | Status: AC
  Administered 2016-12-03: 126 mg via INTRAVENOUS
  Filled 2016-12-03: qty 21

## 2016-12-03 MED ORDER — FAMOTIDINE IN NACL 20-0.9 MG/50ML-% IV SOLN
INTRAVENOUS | Status: AC
  Filled 2016-12-03: qty 50

## 2016-12-03 NOTE — Progress Notes (Signed)
Angela [name]  Clinical Social [name] was referred by Futures trader for assessment of psychosocial needs due to possible depression, financial, caregiver support and transportation concerns.  Clinical Social Worker had left messages for pt in past, but no return call to date. CSW met with patient and her sister at [facility] during treatment to offer support and assess for needs. CSW introduced self, explained role of CSW/Pt and Family Support Team, support groups and other resources to assist.  Pt lives alone and has little local support. Sister, Angela [name], lives in [place] and has been bringing pt to treatment and is main source of support. Sister had many concerns about pt's coping, mental health and finances. Pt open to CSW and sister reviewing resources further to get financial support.   CSW then met with sister alone and sister shared many concerns for pt. These were around very common themes for a breast cancer patient with limited support. CSW spent a great deal of time educating sister on common emotions, reactions and stressors during treatment. Sister appeared to have greater understanding after discussion today.  CSW also provided pt and sister mental health resource options, transportation resources, financial assistance resources and connected them to dietician. CSW team to follow and see on 6/14 to complete assistance applications and follow up.     Clinical Social [name] interventions:  Resource education and referral Supportive counseling  Angela Racer, LCSW, OSW-C Clinical Social Worker [place]  [place] Phone: [phone] Fax: [phone]

## 2016-12-03 NOTE — Patient Instructions (Signed)
[place] Discharge Instructions for Patients Receiving Chemotherapy  Today you received the following chemotherapy agents Taxol  To help prevent nausea and vomiting after your treatment, we encourage you to take your nausea medication   If you develop nausea and vomiting that is not controlled by your nausea medication, call the clinic.   BELOW ARE SYMPTOMS THAT SHOULD BE REPORTED IMMEDIATELY:  *FEVER GREATER THAN 100.5 F  *CHILLS WITH OR WITHOUT FEVER  NAUSEA AND VOMITING THAT IS NOT CONTROLLED WITH YOUR NAUSEA MEDICATION  *UNUSUAL SHORTNESS OF BREATH  *UNUSUAL BRUISING OR BLEEDING  TENDERNESS IN MOUTH AND THROAT WITH OR WITHOUT PRESENCE OF ULCERS  *URINARY PROBLEMS  *BOWEL PROBLEMS  UNUSUAL RASH Items with * indicate a potential emergency and should be followed up as soon as possible.  Feel free to call the clinic you have any questions or concerns. The clinic phone number is [phone].  Please show the [place] at check-in to the Emergency Department and triage nurse.    Paclitaxel injection What is this medicine? PACLITAXEL (PAK li TAX el) is a chemotherapy drug. It targets fast dividing cells, like cancer cells, and causes these cells to die. This medicine is used to treat ovarian cancer, breast cancer, and other cancers. This medicine may be used for other purposes; ask your health care provider or pharmacist if you have questions. COMMON BRAND NAME(S): Onxol, Taxol What should I tell my health care provider before I take this medicine? They need to know if you have any of these conditions: -blood disorders -irregular heartbeat -infection (especially a virus infection such as chickenpox, cold sores, or herpes) -liver disease -previous or ongoing radiation therapy -an unusual or allergic reaction to paclitaxel, alcohol, polyoxyethylated castor oil, other chemotherapy agents, other medicines, foods, dyes, or preservatives -pregnant or  trying to get pregnant -breast-feeding How should I use this medicine? This drug is given as an infusion into a vein. It is administered in a hospital or clinic by a specially trained health care professional. Talk to your pediatrician regarding the use of this medicine in children. Special care may be needed. Overdosage: If you think you have taken too much of this medicine contact a poison control center or emergency room at once. NOTE: This medicine is only for you. Do not share this medicine with others. What if I miss a dose? It is important not to miss your dose. Call your doctor or health care professional if you are unable to keep an appointment. What may interact with this medicine? Do not take this medicine with any of the following medications: -disulfiram -metronidazole This medicine may also interact with the following medications: -cyclosporine -diazepam -ketoconazole -medicines to increase blood counts like filgrastim, pegfilgrastim, sargramostim -other chemotherapy drugs like cisplatin, doxorubicin, epirubicin, etoposide, teniposide, vincristine -quinidine -testosterone -vaccines -verapamil Talk to your doctor or health care professional before taking any of these medicines: -acetaminophen -aspirin -ibuprofen -ketoprofen -naproxen This list may not describe all possible interactions. Give your health care provider a list of all the medicines, herbs, non-prescription drugs, or dietary supplements you use. Also tell them if you smoke, drink alcohol, or use illegal drugs. Some items may interact with your medicine. What should I watch for while using this medicine? Your condition will be monitored carefully while you are receiving this medicine. You will need important blood work done while you are taking this medicine. This medicine can cause serious allergic reactions. To reduce your risk you will need to take other  medicine(s) before treatment with this medicine. If  you experience allergic reactions like skin rash, itching or hives, swelling of the face, lips, or tongue, tell your doctor or health care professional right away. In some cases, you may be given additional medicines to help with side effects. Follow all directions for their use. This drug may make you feel generally unwell. This is not uncommon, as chemotherapy can affect healthy cells as well as cancer cells. Report any side effects. Continue your course of treatment even though you feel ill unless your doctor tells you to stop. Call your doctor or health care professional for advice if you get a fever, chills or sore throat, or other symptoms of a cold or flu. Do not treat yourself. This drug decreases your body's ability to fight infections. Try to avoid being around people who are sick. This medicine may increase your risk to bruise or bleed. Call your doctor or health care professional if you notice any unusual bleeding. Be careful brushing and flossing your teeth or using a toothpick because you may get an infection or bleed more easily. If you have any dental work done, tell your dentist you are receiving this medicine. Avoid taking products that contain aspirin, acetaminophen, ibuprofen, naproxen, or ketoprofen unless instructed by your doctor. These medicines may hide a fever. Do not become pregnant while taking this medicine. Women should inform their doctor if they wish to become pregnant or think they might be pregnant. There is a potential for serious side effects to an unborn child. Talk to your health care professional or pharmacist for more information. Do not breast-feed an infant while taking this medicine. Men are advised not to father a child while receiving this medicine. This product may contain alcohol. Ask your pharmacist or healthcare provider if this medicine contains alcohol. Be sure to tell all healthcare providers you are taking this medicine. Certain medicines, like  metronidazole and disulfiram, can cause an unpleasant reaction when taken with alcohol. The reaction includes flushing, headache, nausea, vomiting, sweating, and increased thirst. The reaction can last from 30 minutes to several hours. What side effects may I notice from receiving this medicine? Side effects that you should report to your doctor or health care professional as soon as possible: -allergic reactions like skin rash, itching or hives, swelling of the face, lips, or tongue -low blood counts - This drug may decrease the number of white blood cells, red blood cells and platelets. You may be at increased risk for infections and bleeding. -signs of infection - fever or chills, cough, sore throat, pain or difficulty passing urine -signs of decreased platelets or bleeding - bruising, pinpoint red spots on the skin, black, tarry stools, nosebleeds -signs of decreased red blood cells - unusually weak or tired, fainting spells, lightheadedness -breathing problems -chest pain -high or low blood pressure -mouth sores -nausea and vomiting -pain, swelling, redness or irritation at the injection site -pain, tingling, numbness in the hands or feet -slow or irregular heartbeat -swelling of the ankle, feet, hands Side effects that usually do not require medical attention (report to your doctor or health care professional if they continue or are bothersome): -bone pain -complete hair loss including hair on your head, underarms, pubic hair, eyebrows, and eyelashes -changes in the color of fingernails -diarrhea -loosening of the fingernails -loss of appetite -muscle or joint pain -red flush to skin -sweating This list may not describe all possible side effects. Call your doctor for medical advice about side effects.  You may report side effects to FDA at 1-800-FDA-1088. Where should I keep my medicine? This drug is given in a hospital or clinic and will not be stored at home. NOTE: This sheet is  a summary. It may not cover all possible information. If you have questions about this medicine, talk to your doctor, pharmacist, or health care provider.  2018 Elsevier/Gold Standard (2015-04-16 19:58:00)

## 2016-12-03 NOTE — Progress Notes (Signed)
61 year old female diagnosed with breast cancer receiving chemotherapy.  Past medical history includes thyroid disease, depression, alcohol, and marijuana.  Medications include fish oil, Zofran, Compazine, and Armour Thyroid.  Labs include glucose 155 and albumin 3.9.  Height: 5 feet 4-3/4 inch. Weight: 117.7 pounds. Usual body weight: 126 pounds in February 2018 BMI: 19.74.  Received a request from social worker to provide oral nutrition supplements for patient. Spoke with patient and sister before she left chemotherapy. She reports poor appetite and acknowledges weight loss. Reports some diarrhea which improved without medication.  Nutrition diagnosis:  Inadequate oral intake related to breast cancer and associated treatments as evidenced by 7% weight loss over 4 months.  Intervention: Educated patient on strategies for improving appetite and oral intake. Recommended high-calorie high-protein foods in small frequent meals and snacks. Provided one complementary case of Ensure Plus. Provided fact sheets and coupons.  Questions were answered.  Teach back method used.  Contact information given.  Monitoring, evaluation, goals: Patient will tolerate increased calories and protein to minimize further weight loss.  Next visit: To be scheduled as needed.  **Disclaimer: This note was dictated with voice recognition software. Similar sounding words can inadvertently be transcribed and this note may contain transcription errors which may not have been corrected upon publication of note.**

## 2016-12-06 ENCOUNTER — Encounter: Payer: Self-pay | Admitting: Hematology

## 2016-12-07 ENCOUNTER — Telehealth: Payer: Self-pay | Admitting: Hematology

## 2016-12-07 NOTE — Telephone Encounter (Signed)
Scheduled appt per 6/7 los - left message and sent reminder letter.

## 2016-12-09 ENCOUNTER — Other Ambulatory Visit: Payer: Self-pay | Admitting: Hematology
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
## 2016-12-10 ENCOUNTER — Other Ambulatory Visit: Payer: Self-pay | Admitting: *Deleted

## 2016-12-10 ENCOUNTER — Ambulatory Visit: Payer: BLUE CROSS/BLUE SHIELD

## 2016-12-10 ENCOUNTER — Other Ambulatory Visit (HOSPITAL_BASED_OUTPATIENT_CLINIC_OR_DEPARTMENT_OTHER): Payer: BLUE CROSS/BLUE SHIELD

## 2016-12-10 ENCOUNTER — Ambulatory Visit (HOSPITAL_BASED_OUTPATIENT_CLINIC_OR_DEPARTMENT_OTHER): Payer: BLUE CROSS/BLUE SHIELD | Admitting: Hematology

## 2016-12-10 ENCOUNTER — Encounter: Payer: Self-pay | Admitting: *Deleted

## 2016-12-10 ENCOUNTER — Ambulatory Visit (HOSPITAL_COMMUNITY)
Admission: RE | Admit: 2016-12-10 | Discharge: 2016-12-10 | Disposition: A | Discharge status: Home / Self Care | Payer: BLUE CROSS/BLUE SHIELD | Source: Ambulatory Visit | Attending: Hematology | Admitting: Hematology

## 2016-12-10 ENCOUNTER — Ambulatory Visit (HOSPITAL_BASED_OUTPATIENT_CLINIC_OR_DEPARTMENT_OTHER): Payer: BLUE CROSS/BLUE SHIELD

## 2016-12-10 VITALS — BP 126/79 | HR 99 | Temp 98.2°F | Resp 16

## 2016-12-10 DIAGNOSIS — Z170 Estrogen receptor positive status [ER+]: Secondary | ICD-10-CM

## 2016-12-10 DIAGNOSIS — C50412 Malignant neoplasm of upper-outer quadrant of left female breast: Secondary | ICD-10-CM

## 2016-12-10 DIAGNOSIS — Z5111 Encounter for antineoplastic chemotherapy: Secondary | ICD-10-CM | POA: Diagnosis not present

## 2016-12-10 DIAGNOSIS — R439 Unspecified disturbances of smell and taste: Secondary | ICD-10-CM

## 2016-12-10 DIAGNOSIS — F329 Major depressive disorder, single episode, unspecified: Secondary | ICD-10-CM

## 2016-12-10 DIAGNOSIS — E039 Hypothyroidism, unspecified: Secondary | ICD-10-CM

## 2016-12-10 DIAGNOSIS — R634 Abnormal weight loss: Secondary | ICD-10-CM | POA: Diagnosis not present

## 2016-12-10 DIAGNOSIS — J984 Other disorders of lung: Secondary | ICD-10-CM | POA: Diagnosis not present

## 2016-12-10 DIAGNOSIS — F320 Major depressive disorder, single episode, mild: Secondary | ICD-10-CM

## 2016-12-10 DIAGNOSIS — Z4682 Encounter for fitting and adjustment of non-vascular catheter: Secondary | ICD-10-CM | POA: Diagnosis not present

## 2016-12-10 DIAGNOSIS — Z95828 Presence of other vascular implants and grafts: Secondary | ICD-10-CM

## 2016-12-10 LAB — CBC WITH DIFFERENTIAL/PLATELET
BASO%: 0.8 % (ref 0.0–2.0)
Basophils Absolute: 0.1 10*3/uL (ref 0.0–0.1)
EOS%: 1.1 % (ref 0.0–7.0)
Eosinophils Absolute: 0.1 10*3/uL (ref 0.0–0.5)
HCT: 28.5 % — ABNORMAL LOW (ref 34.8–46.6)
HGB: 9.7 g/dL — ABNORMAL LOW (ref 11.6–15.9)
LYMPH%: 7.6 % — AB (ref 14.0–49.7)
MCH: 32 pg (ref 25.1–34.0)
MCHC: 33.9 g/dL (ref 31.5–36.0)
MCV: 94.4 fL (ref 79.5–101.0)
MONO#: 0.7 10*3/uL (ref 0.1–0.9)
MONO%: 10.8 % (ref 0.0–14.0)
NEUT%: 79.7 % — ABNORMAL HIGH (ref 38.4–76.8)
NEUTROS ABS: 5.2 10*3/uL (ref 1.5–6.5)
PLATELETS: 230 10*3/uL (ref 145–400)
RBC: 3.02 10*6/uL — AB (ref 3.70–5.45)
RDW: 17.3 % — ABNORMAL HIGH (ref 11.2–14.5)
WBC: 6.5 10*3/uL (ref 3.9–10.3)
lymph#: 0.5 10*3/uL — ABNORMAL LOW (ref 0.9–3.3)

## 2016-12-10 LAB — COMPREHENSIVE METABOLIC PANEL
ALT: 23 U/L (ref 0–55)
ANION GAP: 10 meq/L (ref 3–11)
AST: 31 U/L (ref 5–34)
Albumin: 3.9 g/dL (ref 3.5–5.0)
Alkaline Phosphatase: 87 U/L (ref 40–150)
BILIRUBIN TOTAL: 0.45 mg/dL (ref 0.20–1.20)
BUN: 12.2 mg/dL (ref 7.0–26.0)
CO2: 25 meq/L (ref 22–29)
CREATININE: 0.7 mg/dL (ref 0.6–1.1)
Calcium: 9 mg/dL (ref 8.4–10.4)
Chloride: 105 mEq/L (ref 98–109)
EGFR: 86 mL/min/{1.73_m2} — ABNORMAL LOW (ref >90)
GLUCOSE: 140 mg/dL (ref 70–140)
Potassium: 3.7 mEq/L (ref 3.5–5.1)
Sodium: 140 mEq/L (ref 136–145)
TOTAL PROTEIN: 6.5 g/dL (ref 6.4–8.3)

## 2016-12-10 MED ORDER — PROCHLORPERAZINE MALEATE 10 MG PO TABS
ORAL_TABLET | ORAL | Status: AC
  Filled 2016-12-10: qty 1

## 2016-12-10 MED ORDER — HEPARIN SOD (PORK) LOCK FLUSH 100 UNIT/ML IV SOLN
500.0000 [IU] | Freq: Once | INTRAVENOUS | Status: AC | PRN
  Administered 2016-12-10: 500 [IU]
  Filled 2016-12-10: qty 5

## 2016-12-10 MED ORDER — PACLITAXEL PROTEIN-BOUND CHEMO INJECTION 100 MG
80.0000 mg/m2 | Freq: Once | INTRAVENOUS | Status: AC
  Administered 2016-12-10: 125 mg via INTRAVENOUS
  Filled 2016-12-10: qty 25

## 2016-12-10 MED ORDER — SODIUM CHLORIDE 0.9 % IV SOLN
Freq: Once | INTRAVENOUS | Status: AC
  Administered 2016-12-10: 10:00:00 via INTRAVENOUS

## 2016-12-10 MED ORDER — SODIUM CHLORIDE 0.9% FLUSH
10.0000 mL | Freq: Once | INTRAVENOUS | Status: AC
  Administered 2016-12-10: 10 mL
  Filled 2016-12-10: qty 10

## 2016-12-10 MED ORDER — PROCHLORPERAZINE MALEATE 10 MG PO TABS
10.0000 mg | ORAL_TABLET | Freq: Once | ORAL | Status: AC
  Administered 2016-12-10: 10 mg via ORAL

## 2016-12-10 MED ORDER — SODIUM CHLORIDE 0.9% FLUSH
10.0000 mL | INTRAVENOUS | Status: DC | PRN
  Administered 2016-12-10: 10 mL
  Filled 2016-12-10: qty 10

## 2016-12-10 NOTE — Progress Notes (Signed)
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 Pt had already completed tx when CSW arrived. CSW phoned pt and left message encouraging return call/follow up re. Financial assistance options. CSW encouraged pt to bring needed documents next week when she returns for treatment. CSW team to follow.   Loren Racer, LCSW, OSW-C Clinical Social Worker [place] [place]  [place] Phone: [phone] Fax: [phone]

## 2016-12-10 NOTE — Patient Instructions (Signed)

## 2016-12-13 ENCOUNTER — Encounter: Payer: Self-pay | Admitting: Hematology

## 2016-12-13 DIAGNOSIS — F329 Major depressive disorder, single episode, unspecified: Secondary | ICD-10-CM | POA: Insufficient documentation

## 2016-12-13 DIAGNOSIS — F32A Depression, unspecified: Secondary | ICD-10-CM | POA: Insufficient documentation

## 2016-12-14 ENCOUNTER — Encounter: Payer: Self-pay | Admitting: *Deleted

## 2016-12-16 NOTE — Progress Notes (Signed)
[facility] Work  Clinical Social Work received referral to provide patient with psychiatry referrals in the community.  CSW attempted to contact patient by phone, left voicemail to return call as soon as possible.  Maryjean Morn, MSW, LCSW, OSW-C Clinical Social Worker [facility] [phone]
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
## 2016-12-17 ENCOUNTER — Other Ambulatory Visit (HOSPITAL_BASED_OUTPATIENT_CLINIC_OR_DEPARTMENT_OTHER): Payer: BLUE CROSS/BLUE SHIELD

## 2016-12-17 ENCOUNTER — Other Ambulatory Visit: Payer: BLUE CROSS/BLUE SHIELD

## 2016-12-17 ENCOUNTER — Ambulatory Visit (HOSPITAL_BASED_OUTPATIENT_CLINIC_OR_DEPARTMENT_OTHER): Payer: BLUE CROSS/BLUE SHIELD | Admitting: Adult Health

## 2016-12-17 ENCOUNTER — Encounter: Payer: Self-pay | Admitting: Adult Health

## 2016-12-17 ENCOUNTER — Ambulatory Visit (HOSPITAL_BASED_OUTPATIENT_CLINIC_OR_DEPARTMENT_OTHER): Payer: BLUE CROSS/BLUE SHIELD

## 2016-12-17 ENCOUNTER — Ambulatory Visit: Payer: BLUE CROSS/BLUE SHIELD

## 2016-12-17 VITALS — BP 120/67 | HR 98 | Temp 97.8°F | Resp 18 | Ht 64.75 in | Wt 117.6 lb

## 2016-12-17 DIAGNOSIS — Z170 Estrogen receptor positive status [ER+]: Principal | ICD-10-CM

## 2016-12-17 DIAGNOSIS — Z5111 Encounter for antineoplastic chemotherapy: Secondary | ICD-10-CM | POA: Diagnosis not present

## 2016-12-17 DIAGNOSIS — Z95828 Presence of other vascular implants and grafts: Secondary | ICD-10-CM

## 2016-12-17 DIAGNOSIS — C50412 Malignant neoplasm of upper-outer quadrant of left female breast: Secondary | ICD-10-CM

## 2016-12-17 LAB — COMPREHENSIVE METABOLIC PANEL
ALBUMIN: 3.7 g/dL (ref 3.5–5.0)
ALK PHOS: 70 U/L (ref 40–150)
ALT: 11 U/L (ref 0–55)
AST: 20 U/L (ref 5–34)
Anion Gap: 12 mEq/L — ABNORMAL HIGH (ref 3–11)
BUN: 7.9 mg/dL (ref 7.0–26.0)
CO2: 21 meq/L — AB (ref 22–29)
Calcium: 8.5 mg/dL (ref 8.4–10.4)
Chloride: 107 mEq/L (ref 98–109)
Creatinine: 0.7 mg/dL (ref 0.6–1.1)
GLUCOSE: 134 mg/dL (ref 70–140)
POTASSIUM: 3.1 meq/L — AB (ref 3.5–5.1)
SODIUM: 141 meq/L (ref 136–145)
Total Bilirubin: 0.5 mg/dL (ref 0.20–1.20)
Total Protein: 6.1 g/dL — ABNORMAL LOW (ref 6.4–8.3)

## 2016-12-17 LAB — CBC WITH DIFFERENTIAL/PLATELET
BASO%: 1.2 % (ref 0.0–2.0)
Basophils Absolute: 0.1 10*3/uL (ref 0.0–0.1)
EOS ABS: 0.1 10*3/uL (ref 0.0–0.5)
EOS%: 3 % (ref 0.0–7.0)
HCT: 28.1 % — ABNORMAL LOW (ref 34.8–46.6)
HGB: 9.5 g/dL — ABNORMAL LOW (ref 11.6–15.9)
LYMPH%: 14.3 % (ref 14.0–49.7)
MCH: 32.4 pg (ref 25.1–34.0)
MCHC: 33.8 g/dL (ref 31.5–36.0)
MCV: 95.9 fL (ref 79.5–101.0)
MONO#: 0.5 10*3/uL (ref 0.1–0.9)
MONO%: 10.9 % (ref 0.0–14.0)
NEUT#: 3 10*3/uL (ref 1.5–6.5)
NEUT%: 70.6 % (ref 38.4–76.8)
Platelets: 230 10*3/uL (ref 145–400)
RBC: 2.93 10*6/uL — AB (ref 3.70–5.45)
RDW: 17 % — ABNORMAL HIGH (ref 11.2–14.5)
WBC: 4.3 10*3/uL (ref 3.9–10.3)
lymph#: 0.6 10*3/uL — ABNORMAL LOW (ref 0.9–3.3)

## 2016-12-17 MED ORDER — PROCHLORPERAZINE MALEATE 10 MG PO TABS
ORAL_TABLET | ORAL | Status: AC
  Filled 2016-12-17: qty 1

## 2016-12-17 MED ORDER — SODIUM CHLORIDE 0.9 % IV SOLN
Freq: Once | INTRAVENOUS | Status: AC
  Administered 2016-12-17: 14:00:00 via INTRAVENOUS

## 2016-12-17 MED ORDER — SODIUM CHLORIDE 0.9% FLUSH
10.0000 mL | Freq: Once | INTRAVENOUS | Status: AC
  Administered 2016-12-17: 10 mL
  Filled 2016-12-17: qty 10

## 2016-12-17 MED ORDER — SODIUM CHLORIDE 0.9% FLUSH
10.0000 mL | INTRAVENOUS | Status: DC | PRN
  Administered 2016-12-17: 10 mL
  Filled 2016-12-17: qty 10

## 2016-12-17 MED ORDER — PACLITAXEL PROTEIN-BOUND CHEMO INJECTION 100 MG
80.0000 mg/m2 | Freq: Once | INTRAVENOUS | Status: AC
  Administered 2016-12-17: 125 mg via INTRAVENOUS
  Filled 2016-12-17: qty 25

## 2016-12-17 MED ORDER — HEPARIN SOD (PORK) LOCK FLUSH 100 UNIT/ML IV SOLN
500.0000 [IU] | Freq: Once | INTRAVENOUS | Status: AC | PRN
  Administered 2016-12-17: 500 [IU]
  Filled 2016-12-17: qty 5

## 2016-12-17 MED ORDER — PROCHLORPERAZINE MALEATE 10 MG PO TABS
10.0000 mg | ORAL_TABLET | Freq: Once | ORAL | Status: AC
  Administered 2016-12-17: 10 mg via ORAL

## 2016-12-17 NOTE — Patient Instructions (Signed)

## 2016-12-17 NOTE — Patient Instructions (Signed)
[facility] Discharge Instructions for Patients Receiving Chemotherapy  Today you received the following chemotherapy agents Abraxane To help prevent nausea and vomiting after your treatment, we encourage you to take your nausea medication as prescribed.   If you develop nausea and vomiting that is not controlled by your nausea medication, call the clinic.   BELOW ARE SYMPTOMS THAT SHOULD BE REPORTED IMMEDIATELY:  *FEVER GREATER THAN 100.5 F  *CHILLS WITH OR WITHOUT FEVER  NAUSEA AND VOMITING THAT IS NOT CONTROLLED WITH YOUR NAUSEA MEDICATION  *UNUSUAL SHORTNESS OF BREATH  *UNUSUAL BRUISING OR BLEEDING  TENDERNESS IN MOUTH AND THROAT WITH OR WITHOUT PRESENCE OF ULCERS  *URINARY PROBLEMS  *BOWEL PROBLEMS  UNUSUAL RASH Items with * indicate a potential emergency and should be followed up as soon as possible.  Feel free to call the clinic you have any questions or concerns. The clinic phone number is [phone].  Please show the CHEMO ALERT CARD at check-in to the Emergency Department and triage nurse.   

## 2016-12-17 NOTE — Patient Instructions (Signed)

## 2016-12-17 NOTE — Assessment & Plan Note (Signed)
Angela [name] is a 61 y/o woman with clinical stage IIB ER positive IDC diagnosed in 07/2016 who is currently receiving neoadjuvant chemotherapy.   [name] is doing moderately well today.  I reviewed her labs with her in detail.  She will proceed with chemotherapy with Abraxane.  I counseled her and her sister about peripheral neuropathy and she will monitor closely for any of the symptoms we discussed.    Oline has a potassium of 3.1.  I gave her a handout of potassium rich foods to eat.  Should her potassium be decreased again next week, I told her she would need oral supplementation.    Tali will return in 1 week for labs and Abraxane, and in 2 weeks for labs, appt with Dr. Burr Medico and Abraxane.

## 2016-12-18 ENCOUNTER — Encounter: Payer: Self-pay | Admitting: *Deleted

## 2016-12-18 NOTE — Progress Notes (Signed)
[place] Work  Clinical Social Work was referred by need for CSW follow up on resource obtainment and connection to counseling options. CSW left additional message for patient on her home phone and also contacted her sister for assessment of psychosocial needs.  Per sister, she is assisting pt with these resources and will attempt to gather needed documents this weekend. She reports she plans to move pt to her home this weekend in order to have additional support during treatment. CSW, pt and sister plan to meet next week to review progress and next steps.    Clinical Social Work interventions: Check in  Angela [name], [place], [place] Clinical Social Worker [place]  [place] Phone: [phone] Fax: [phone]

## 2016-12-24 ENCOUNTER — Encounter: Payer: Self-pay | Admitting: Nutrition

## 2016-12-24 ENCOUNTER — Other Ambulatory Visit (HOSPITAL_BASED_OUTPATIENT_CLINIC_OR_DEPARTMENT_OTHER): Payer: BLUE CROSS/BLUE SHIELD

## 2016-12-24 ENCOUNTER — Ambulatory Visit (HOSPITAL_BASED_OUTPATIENT_CLINIC_OR_DEPARTMENT_OTHER): Payer: BLUE CROSS/BLUE SHIELD

## 2016-12-24 VITALS — BP 112/61 | HR 94 | Temp 98.0°F | Resp 18

## 2016-12-24 DIAGNOSIS — C50412 Malignant neoplasm of upper-outer quadrant of left female breast: Secondary | ICD-10-CM

## 2016-12-24 DIAGNOSIS — Z170 Estrogen receptor positive status [ER+]: Principal | ICD-10-CM

## 2016-12-24 DIAGNOSIS — Z5112 Encounter for antineoplastic immunotherapy: Secondary | ICD-10-CM | POA: Diagnosis not present

## 2016-12-24 LAB — COMPREHENSIVE METABOLIC PANEL WITH GFR
ALT: 10 U/L (ref 0–55)
AST: 23 U/L (ref 5–34)
Albumin: 3.9 g/dL (ref 3.5–5.0)
Alkaline Phosphatase: 75 U/L (ref 40–150)
Anion Gap: 11 meq/L (ref 3–11)
BUN: 11.7 mg/dL (ref 7.0–26.0)
CO2: 24 meq/L (ref 22–29)
Calcium: 9.1 mg/dL (ref 8.4–10.4)
Chloride: 108 meq/L (ref 98–109)
Creatinine: 0.7 mg/dL (ref 0.6–1.1)
EGFR: >90 ml/min/1.73 m2 (ref >90)
Glucose: 112 mg/dL (ref 70–140)
Potassium: 3.6 meq/L (ref 3.5–5.1)
Sodium: 143 meq/L (ref 136–145)
Total Bilirubin: 0.41 mg/dL (ref 0.20–1.20)
Total Protein: 6.5 g/dL (ref 6.4–8.3)

## 2016-12-24 LAB — CBC WITH DIFFERENTIAL/PLATELET
BASO%: 0.5 % (ref 0.0–2.0)
Basophils Absolute: 0 10e3/uL (ref 0.0–0.1)
EOS%: 2.2 % (ref 0.0–7.0)
Eosinophils Absolute: 0.1 10e3/uL (ref 0.0–0.5)
HCT: 31 % — ABNORMAL LOW (ref 34.8–46.6)
HGB: 10.5 g/dL — ABNORMAL LOW (ref 11.6–15.9)
LYMPH%: 14.8 % (ref 14.0–49.7)
MCH: 33 pg (ref 25.1–34.0)
MCHC: 33.7 g/dL (ref 31.5–36.0)
MCV: 97.9 fL (ref 79.5–101.0)
MONO#: 0.4 10e3/uL (ref 0.1–0.9)
MONO%: 10 % (ref 0.0–14.0)
NEUT#: 2.6 10e3/uL (ref 1.5–6.5)
NEUT%: 72.5 % (ref 38.4–76.8)
Platelets: 231 10e3/uL (ref 145–400)
RBC: 3.17 10e6/uL — ABNORMAL LOW (ref 3.70–5.45)
RDW: 16.4 % — ABNORMAL HIGH (ref 11.2–14.5)
WBC: 3.5 10e3/uL — ABNORMAL LOW (ref 3.9–10.3)
lymph#: 0.5 10e3/uL — ABNORMAL LOW (ref 0.9–3.3)

## 2016-12-24 MED ORDER — PROCHLORPERAZINE MALEATE 10 MG PO TABS
ORAL_TABLET | ORAL | Status: AC
  Filled 2016-12-24: qty 1

## 2016-12-24 MED ORDER — PACLITAXEL PROTEIN-BOUND CHEMO INJECTION 100 MG
80.0000 mg/m2 | Freq: Once | INTRAVENOUS | Status: AC
  Administered 2016-12-24: 125 mg via INTRAVENOUS
  Filled 2016-12-24: qty 25

## 2016-12-24 MED ORDER — SODIUM CHLORIDE 0.9% FLUSH
10.0000 mL | INTRAVENOUS | Status: DC | PRN
  Administered 2016-12-24: 10 mL
  Filled 2016-12-24: qty 10

## 2016-12-24 MED ORDER — SODIUM CHLORIDE 0.9 % IV SOLN
Freq: Once | INTRAVENOUS | Status: AC
  Administered 2016-12-24: 08:00:00 via INTRAVENOUS

## 2016-12-24 MED ORDER — HEPARIN SOD (PORK) LOCK FLUSH 100 UNIT/ML IV SOLN
500.0000 [IU] | Freq: Once | INTRAVENOUS | Status: AC | PRN
  Administered 2016-12-24: 500 [IU]
  Filled 2016-12-24: qty 5

## 2016-12-24 MED ORDER — PROCHLORPERAZINE MALEATE 10 MG PO TABS
10.0000 mg | ORAL_TABLET | Freq: Once | ORAL | Status: AC
  Administered 2016-12-24: 10 mg via ORAL

## 2016-12-24 NOTE — Progress Notes (Signed)
Provided second complimentary case of Ensure Plus. 

## 2016-12-24 NOTE — Patient Instructions (Signed)
[facility] Discharge Instructions for Patients Receiving Chemotherapy  Today you received the following chemotherapy agents Abraxane To help prevent nausea and vomiting after your treatment, we encourage you to take your nausea medication as prescribed.   If you develop nausea and vomiting that is not controlled by your nausea medication, call the clinic.   BELOW ARE SYMPTOMS THAT SHOULD BE REPORTED IMMEDIATELY:  *FEVER GREATER THAN 100.5 F  *CHILLS WITH OR WITHOUT FEVER  NAUSEA AND VOMITING THAT IS NOT CONTROLLED WITH YOUR NAUSEA MEDICATION  *UNUSUAL SHORTNESS OF BREATH  *UNUSUAL BRUISING OR BLEEDING  TENDERNESS IN MOUTH AND THROAT WITH OR WITHOUT PRESENCE OF ULCERS  *URINARY PROBLEMS  *BOWEL PROBLEMS  UNUSUAL RASH Items with * indicate a potential emergency and should be followed up as soon as possible.  Feel free to call the clinic you have any questions or concerns. The clinic phone number is [phone].  Please show the CHEMO ALERT CARD at check-in to the Emergency Department and triage nurse.   
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
## 2016-12-31 ENCOUNTER — Ambulatory Visit (HOSPITAL_BASED_OUTPATIENT_CLINIC_OR_DEPARTMENT_OTHER): Payer: BLUE CROSS/BLUE SHIELD | Admitting: Hematology

## 2016-12-31 ENCOUNTER — Ambulatory Visit: Payer: BLUE CROSS/BLUE SHIELD

## 2016-12-31 ENCOUNTER — Telehealth: Payer: Self-pay | Admitting: Hematology

## 2016-12-31 ENCOUNTER — Ambulatory Visit (HOSPITAL_BASED_OUTPATIENT_CLINIC_OR_DEPARTMENT_OTHER): Payer: BLUE CROSS/BLUE SHIELD

## 2016-12-31 ENCOUNTER — Other Ambulatory Visit (HOSPITAL_BASED_OUTPATIENT_CLINIC_OR_DEPARTMENT_OTHER): Payer: BLUE CROSS/BLUE SHIELD

## 2016-12-31 ENCOUNTER — Other Ambulatory Visit: Payer: Self-pay

## 2016-12-31 VITALS — BP 124/67 | HR 97 | Temp 98.2°F | Resp 20 | Ht 64.75 in | Wt 117.3 lb

## 2016-12-31 DIAGNOSIS — F320 Major depressive disorder, single episode, mild: Secondary | ICD-10-CM

## 2016-12-31 DIAGNOSIS — F329 Major depressive disorder, single episode, unspecified: Secondary | ICD-10-CM | POA: Diagnosis not present

## 2016-12-31 DIAGNOSIS — C50412 Malignant neoplasm of upper-outer quadrant of left female breast: Secondary | ICD-10-CM

## 2016-12-31 DIAGNOSIS — R634 Abnormal weight loss: Secondary | ICD-10-CM | POA: Diagnosis not present

## 2016-12-31 DIAGNOSIS — Z95828 Presence of other vascular implants and grafts: Secondary | ICD-10-CM

## 2016-12-31 DIAGNOSIS — Z5111 Encounter for antineoplastic chemotherapy: Secondary | ICD-10-CM

## 2016-12-31 DIAGNOSIS — Z170 Estrogen receptor positive status [ER+]: Secondary | ICD-10-CM

## 2016-12-31 DIAGNOSIS — E039 Hypothyroidism, unspecified: Secondary | ICD-10-CM

## 2016-12-31 LAB — CBC WITH DIFFERENTIAL/PLATELET
BASO%: 1.1 % (ref 0.0–2.0)
BASOS ABS: 0 10*3/uL (ref 0.0–0.1)
EOS ABS: 0.1 10*3/uL (ref 0.0–0.5)
EOS%: 2.2 % (ref 0.0–7.0)
HCT: 32.4 % — ABNORMAL LOW (ref 34.8–46.6)
HEMOGLOBIN: 11.1 g/dL — AB (ref 11.6–15.9)
LYMPH%: 19.8 % (ref 14.0–49.7)
MCH: 32.7 pg (ref 25.1–34.0)
MCHC: 34.3 g/dL (ref 31.5–36.0)
MCV: 95.6 fL (ref 79.5–101.0)
MONO#: 0.4 10*3/uL (ref 0.1–0.9)
MONO%: 10.4 % (ref 0.0–14.0)
NEUT#: 2.4 10*3/uL (ref 1.5–6.5)
NEUT%: 66.5 % (ref 38.4–76.8)
Platelets: 211 10*3/uL (ref 145–400)
RBC: 3.39 10*6/uL — ABNORMAL LOW (ref 3.70–5.45)
RDW: 14.4 % (ref 11.2–14.5)
WBC: 3.6 10*3/uL — ABNORMAL LOW (ref 3.9–10.3)
lymph#: 0.7 10*3/uL — ABNORMAL LOW (ref 0.9–3.3)

## 2016-12-31 LAB — COMPREHENSIVE METABOLIC PANEL
ALBUMIN: 4 g/dL (ref 3.5–5.0)
ALK PHOS: 78 U/L (ref 40–150)
ALT: 12 U/L (ref 0–55)
AST: 26 U/L (ref 5–34)
Anion Gap: 11 mEq/L (ref 3–11)
BUN: 13.3 mg/dL (ref 7.0–26.0)
CO2: 26 mEq/L (ref 22–29)
Calcium: 9 mg/dL (ref 8.4–10.4)
Chloride: 103 mEq/L (ref 98–109)
Creatinine: 0.8 mg/dL (ref 0.6–1.1)
EGFR: 85 mL/min/{1.73_m2} — AB (ref >90)
GLUCOSE: 139 mg/dL (ref 70–140)
POTASSIUM: 3.6 meq/L (ref 3.5–5.1)
SODIUM: 140 meq/L (ref 136–145)
Total Bilirubin: 0.35 mg/dL (ref 0.20–1.20)
Total Protein: 6.7 g/dL (ref 6.4–8.3)

## 2016-12-31 MED ORDER — HEPARIN SOD (PORK) LOCK FLUSH 100 UNIT/ML IV SOLN
500.0000 [IU] | Freq: Once | INTRAVENOUS | Status: AC | PRN
  Administered 2016-12-31: 500 [IU]
  Filled 2016-12-31: qty 5

## 2016-12-31 MED ORDER — SODIUM CHLORIDE 0.9% FLUSH
10.0000 mL | Freq: Once | INTRAVENOUS | Status: AC
  Administered 2016-12-31: 10 mL
  Filled 2016-12-31: qty 10

## 2016-12-31 MED ORDER — SODIUM CHLORIDE 0.9 % IV SOLN
Freq: Once | INTRAVENOUS | Status: AC
  Administered 2016-12-31: 16:00:00 via INTRAVENOUS

## 2016-12-31 MED ORDER — PACLITAXEL PROTEIN-BOUND CHEMO INJECTION 100 MG
80.0000 mg/m2 | Freq: Once | INTRAVENOUS | Status: AC
  Administered 2016-12-31: 125 mg via INTRAVENOUS
  Filled 2016-12-31: qty 25

## 2016-12-31 MED ORDER — PROCHLORPERAZINE MALEATE 10 MG PO TABS
10.0000 mg | ORAL_TABLET | Freq: Once | ORAL | Status: AC
  Administered 2016-12-31: 10 mg via ORAL

## 2016-12-31 MED ORDER — SODIUM CHLORIDE 0.9% FLUSH
10.0000 mL | INTRAVENOUS | Status: DC | PRN
  Administered 2016-12-31: 10 mL
  Filled 2016-12-31: qty 10

## 2016-12-31 MED ORDER — PROCHLORPERAZINE MALEATE 10 MG PO TABS
ORAL_TABLET | ORAL | Status: AC
Start: 2016-12-31 — End: ?
  Filled 2016-12-31: qty 1

## 2016-12-31 NOTE — Telephone Encounter (Signed)
Appointments scheduled per 12/31/16 los. Patient was given a copy of the AVS report and appointment schedule, per 12/31/16 los.

## 2016-12-31 NOTE — Patient Instructions (Signed)

## 2016-12-31 NOTE — Telephone Encounter (Signed)
Appointments scheduled per 12/31/16 los. °Patient was given a copy of the AVS report and appointment schedule per 12/31/16 los. °

## 2016-12-31 NOTE — Patient Instructions (Signed)
[facility] Discharge Instructions for Patients Receiving Chemotherapy  Today you received the following chemotherapy agents: Abraxane   To help prevent nausea and vomiting after your treatment, we encourage you to take your nausea medication as directed.    If you develop nausea and vomiting that is not controlled by your nausea medication, call the clinic.   BELOW ARE SYMPTOMS THAT SHOULD BE REPORTED IMMEDIATELY:  *FEVER GREATER THAN 100.5 F  *CHILLS WITH OR WITHOUT FEVER  NAUSEA AND VOMITING THAT IS NOT CONTROLLED WITH YOUR NAUSEA MEDICATION  *UNUSUAL SHORTNESS OF BREATH  *UNUSUAL BRUISING OR BLEEDING  TENDERNESS IN MOUTH AND THROAT WITH OR WITHOUT PRESENCE OF ULCERS  *URINARY PROBLEMS  *BOWEL PROBLEMS  UNUSUAL RASH Items with * indicate a potential emergency and should be followed up as soon as possible.  Feel free to call the clinic you have any questions or concerns. The clinic phone number is [phone].  Please show the CHEMO ALERT CARD at check-in to the Emergency Department and triage nurse.   

## 2017-01-02 ENCOUNTER — Encounter: Payer: Self-pay | Admitting: Hematology

## 2017-01-07 ENCOUNTER — Other Ambulatory Visit: Payer: Self-pay | Admitting: Hematology

## 2017-01-07 ENCOUNTER — Other Ambulatory Visit (HOSPITAL_BASED_OUTPATIENT_CLINIC_OR_DEPARTMENT_OTHER): Payer: BLUE CROSS/BLUE SHIELD

## 2017-01-07 ENCOUNTER — Ambulatory Visit (HOSPITAL_BASED_OUTPATIENT_CLINIC_OR_DEPARTMENT_OTHER): Payer: BLUE CROSS/BLUE SHIELD

## 2017-01-07 ENCOUNTER — Encounter: Payer: Self-pay | Admitting: *Deleted

## 2017-01-07 VITALS — BP 115/72 | HR 86 | Temp 98.2°F | Resp 19

## 2017-01-07 DIAGNOSIS — Z170 Estrogen receptor positive status [ER+]: Principal | ICD-10-CM

## 2017-01-07 DIAGNOSIS — Z5111 Encounter for antineoplastic chemotherapy: Secondary | ICD-10-CM | POA: Diagnosis not present

## 2017-01-07 DIAGNOSIS — C50412 Malignant neoplasm of upper-outer quadrant of left female breast: Secondary | ICD-10-CM | POA: Diagnosis not present

## 2017-01-07 LAB — CBC WITH DIFFERENTIAL/PLATELET
BASO%: 0.5 % (ref 0.0–2.0)
BASOS ABS: 0 10*3/uL (ref 0.0–0.1)
EOS ABS: 0 10*3/uL (ref 0.0–0.5)
EOS%: 0.9 % (ref 0.0–7.0)
HEMATOCRIT: 30.2 % — AB (ref 34.8–46.6)
HEMOGLOBIN: 10.2 g/dL — AB (ref 11.6–15.9)
LYMPH%: 16.3 % (ref 14.0–49.7)
MCH: 32.8 pg (ref 25.1–34.0)
MCHC: 33.8 g/dL (ref 31.5–36.0)
MCV: 97.1 fL (ref 79.5–101.0)
MONO#: 0.2 10*3/uL (ref 0.1–0.9)
MONO%: 5.4 % (ref 0.0–14.0)
NEUT#: 3.4 10*3/uL (ref 1.5–6.5)
NEUT%: 76.9 % — ABNORMAL HIGH (ref 38.4–76.8)
PLATELETS: 209 10*3/uL (ref 145–400)
RBC: 3.11 10*6/uL — ABNORMAL LOW (ref 3.70–5.45)
RDW: 14.1 % (ref 11.2–14.5)
WBC: 4.4 10*3/uL (ref 3.9–10.3)
lymph#: 0.7 10*3/uL — ABNORMAL LOW (ref 0.9–3.3)

## 2017-01-07 LAB — COMPREHENSIVE METABOLIC PANEL
ALBUMIN: 4 g/dL (ref 3.5–5.0)
ALK PHOS: 72 U/L (ref 40–150)
ALT: 10 U/L (ref 0–55)
ANION GAP: 11 meq/L (ref 3–11)
AST: 18 U/L (ref 5–34)
BUN: 11.3 mg/dL (ref 7.0–26.0)
CALCIUM: 8.9 mg/dL (ref 8.4–10.4)
CO2: 25 mEq/L (ref 22–29)
Chloride: 107 mEq/L (ref 98–109)
Creatinine: 0.7 mg/dL (ref 0.6–1.1)
EGFR: 89 mL/min/{1.73_m2} — AB (ref >90)
Glucose: 123 mg/dl (ref 70–140)
POTASSIUM: 3.5 meq/L (ref 3.5–5.1)
SODIUM: 143 meq/L (ref 136–145)
Total Bilirubin: 0.43 mg/dL (ref 0.20–1.20)
Total Protein: 6.5 g/dL (ref 6.4–8.3)

## 2017-01-07 MED ORDER — SODIUM CHLORIDE 0.9 % IV SOLN
Freq: Once | INTRAVENOUS | Status: AC
  Administered 2017-01-07: 08:00:00 via INTRAVENOUS

## 2017-01-07 MED ORDER — HEPARIN SOD (PORK) LOCK FLUSH 100 UNIT/ML IV SOLN
500.0000 [IU] | Freq: Once | INTRAVENOUS | Status: AC | PRN
  Administered 2017-01-07: 500 [IU]
  Filled 2017-01-07: qty 5

## 2017-01-07 MED ORDER — PROCHLORPERAZINE MALEATE 10 MG PO TABS
ORAL_TABLET | ORAL | Status: AC
  Filled 2017-01-07: qty 1

## 2017-01-07 MED ORDER — PACLITAXEL PROTEIN-BOUND CHEMO INJECTION 100 MG
80.0000 mg/m2 | Freq: Once | INTRAVENOUS | Status: AC
  Administered 2017-01-07: 125 mg via INTRAVENOUS
  Filled 2017-01-07: qty 25

## 2017-01-07 MED ORDER — PROCHLORPERAZINE MALEATE 10 MG PO TABS
10.0000 mg | ORAL_TABLET | Freq: Once | ORAL | Status: AC
  Administered 2017-01-07: 10 mg via ORAL

## 2017-01-07 MED ORDER — SODIUM CHLORIDE 0.9% FLUSH
10.0000 mL | INTRAVENOUS | Status: DC | PRN
Start: 2017-01-07 — End: 2017-01-07
  Administered 2017-01-07: 10 mL
  Filled 2017-01-07: qty 10

## 2017-01-07 NOTE — Progress Notes (Signed)
Angela [name]  Clinical Social [name] was referred by patient's sister, Lelon Frohlich for assessment of psychosocial needs due to financial concerns.  Clinical Social Worker met with sister to [name] on applications and make copies of needed documents. CSW to submit [employer] application today and sister to gather needed documents to complete [employer] application. CSW to meet with sister next week to complete applicaton once additional documents are gathered. CSW team to follow and assist accordingly.     Clinical Social [name] interventions:  Resource assistance and referral  Loren Racer, LCSW, OSW-C Clinical Social Worker [place]  [place] Phone: [phone] Fax: [phone]

## 2017-01-07 NOTE — Patient Instructions (Signed)
[facility] Discharge Instructions for Patients Receiving Chemotherapy  Today you received the following chemotherapy agents: Abraxane   To help prevent nausea and vomiting after your treatment, we encourage you to take your nausea medication as directed.    If you develop nausea and vomiting that is not controlled by your nausea medication, call the clinic.   BELOW ARE SYMPTOMS THAT SHOULD BE REPORTED IMMEDIATELY:  *FEVER GREATER THAN 100.5 F  *CHILLS WITH OR WITHOUT FEVER  NAUSEA AND VOMITING THAT IS NOT CONTROLLED WITH YOUR NAUSEA MEDICATION  *UNUSUAL SHORTNESS OF BREATH  *UNUSUAL BRUISING OR BLEEDING  TENDERNESS IN MOUTH AND THROAT WITH OR WITHOUT PRESENCE OF ULCERS  *URINARY PROBLEMS  *BOWEL PROBLEMS  UNUSUAL RASH Items with * indicate a potential emergency and should be followed up as soon as possible.  Feel free to call the clinic you have any questions or concerns. The clinic phone number is [phone].  Please show the CHEMO ALERT CARD at check-in to the Emergency Department and triage nurse.   

## 2017-01-15 ENCOUNTER — Telehealth: Payer: Self-pay | Admitting: Hematology

## 2017-01-15 ENCOUNTER — Ambulatory Visit (HOSPITAL_BASED_OUTPATIENT_CLINIC_OR_DEPARTMENT_OTHER): Payer: BLUE CROSS/BLUE SHIELD

## 2017-01-15 ENCOUNTER — Ambulatory Visit (HOSPITAL_BASED_OUTPATIENT_CLINIC_OR_DEPARTMENT_OTHER): Payer: BLUE CROSS/BLUE SHIELD | Admitting: Nurse Practitioner

## 2017-01-15 ENCOUNTER — Other Ambulatory Visit (HOSPITAL_BASED_OUTPATIENT_CLINIC_OR_DEPARTMENT_OTHER): Payer: BLUE CROSS/BLUE SHIELD

## 2017-01-15 ENCOUNTER — Ambulatory Visit: Payer: BLUE CROSS/BLUE SHIELD

## 2017-01-15 VITALS — BP 120/59 | HR 80 | Temp 97.9°F | Resp 18 | Ht 64.75 in | Wt 121.0 lb

## 2017-01-15 DIAGNOSIS — Z5111 Encounter for antineoplastic chemotherapy: Secondary | ICD-10-CM | POA: Diagnosis not present

## 2017-01-15 DIAGNOSIS — E039 Hypothyroidism, unspecified: Secondary | ICD-10-CM | POA: Diagnosis not present

## 2017-01-15 DIAGNOSIS — Z9221 Personal history of antineoplastic chemotherapy: Secondary | ICD-10-CM

## 2017-01-15 DIAGNOSIS — C50412 Malignant neoplasm of upper-outer quadrant of left female breast: Secondary | ICD-10-CM

## 2017-01-15 DIAGNOSIS — F329 Major depressive disorder, single episode, unspecified: Secondary | ICD-10-CM

## 2017-01-15 DIAGNOSIS — Z170 Estrogen receptor positive status [ER+]: Secondary | ICD-10-CM | POA: Diagnosis not present

## 2017-01-15 DIAGNOSIS — Z95828 Presence of other vascular implants and grafts: Secondary | ICD-10-CM

## 2017-01-15 LAB — COMPREHENSIVE METABOLIC PANEL
ALT: 8 U/L (ref 0–55)
ANION GAP: 8 meq/L (ref 3–11)
AST: 18 U/L (ref 5–34)
Albumin: 3.9 g/dL (ref 3.5–5.0)
Alkaline Phosphatase: 69 U/L (ref 40–150)
BUN: 10.5 mg/dL (ref 7.0–26.0)
CALCIUM: 8.9 mg/dL (ref 8.4–10.4)
CHLORIDE: 105 meq/L (ref 98–109)
CO2: 26 mEq/L (ref 22–29)
Creatinine: 0.7 mg/dL (ref 0.6–1.1)
EGFR: 89 mL/min/{1.73_m2} — AB (ref >90)
Glucose: 168 mg/dl — ABNORMAL HIGH (ref 70–140)
POTASSIUM: 3.6 meq/L (ref 3.5–5.1)
Sodium: 139 mEq/L (ref 136–145)
Total Bilirubin: 0.35 mg/dL (ref 0.20–1.20)
Total Protein: 6.4 g/dL (ref 6.4–8.3)

## 2017-01-15 LAB — CBC WITH DIFFERENTIAL/PLATELET
BASO%: 0.3 % (ref 0.0–2.0)
BASOS ABS: 0 10*3/uL (ref 0.0–0.1)
EOS%: 1.3 % (ref 0.0–7.0)
Eosinophils Absolute: 0.1 10*3/uL (ref 0.0–0.5)
HCT: 32.4 % — ABNORMAL LOW (ref 34.8–46.6)
HGB: 10.9 g/dL — ABNORMAL LOW (ref 11.6–15.9)
LYMPH#: 0.7 10*3/uL — AB (ref 0.9–3.3)
LYMPH%: 17.3 % (ref 14.0–49.7)
MCH: 32.9 pg (ref 25.1–34.0)
MCHC: 33.6 g/dL (ref 31.5–36.0)
MCV: 97.9 fL (ref 79.5–101.0)
MONO#: 0.4 10*3/uL (ref 0.1–0.9)
MONO%: 9.8 % (ref 0.0–14.0)
NEUT#: 2.8 10*3/uL (ref 1.5–6.5)
NEUT%: 71.3 % (ref 38.4–76.8)
Platelets: 215 10*3/uL (ref 145–400)
RBC: 3.31 10*6/uL — AB (ref 3.70–5.45)
RDW: 13.8 % (ref 11.2–14.5)
WBC: 4 10*3/uL (ref 3.9–10.3)

## 2017-01-15 MED ORDER — SODIUM CHLORIDE 0.9% FLUSH
10.0000 mL | Freq: Once | INTRAVENOUS | Status: AC
  Administered 2017-01-15: 10 mL
  Filled 2017-01-15: qty 10

## 2017-01-15 MED ORDER — HEPARIN SOD (PORK) LOCK FLUSH 100 UNIT/ML IV SOLN
500.0000 [IU] | Freq: Once | INTRAVENOUS | Status: AC | PRN
  Administered 2017-01-15: 500 [IU]
  Filled 2017-01-15: qty 5

## 2017-01-15 MED ORDER — SODIUM CHLORIDE 0.9% FLUSH
10.0000 mL | INTRAVENOUS | Status: DC | PRN
  Administered 2017-01-15: 10 mL
  Filled 2017-01-15: qty 10

## 2017-01-15 MED ORDER — PROCHLORPERAZINE MALEATE 10 MG PO TABS
10.0000 mg | ORAL_TABLET | Freq: Once | ORAL | Status: AC
  Administered 2017-01-15: 10 mg via ORAL

## 2017-01-15 MED ORDER — PROCHLORPERAZINE MALEATE 10 MG PO TABS
ORAL_TABLET | ORAL | Status: AC
  Filled 2017-01-15: qty 1

## 2017-01-15 MED ORDER — SODIUM CHLORIDE 0.9 % IV SOLN
Freq: Once | INTRAVENOUS | Status: AC
  Administered 2017-01-15: 12:00:00 via INTRAVENOUS

## 2017-01-15 MED ORDER — PACLITAXEL PROTEIN-BOUND CHEMO INJECTION 100 MG
80.0000 mg/m2 | Freq: Once | INTRAVENOUS | Status: AC
  Administered 2017-01-15: 125 mg via INTRAVENOUS
  Filled 2017-01-15: qty 25

## 2017-01-15 NOTE — Patient Instructions (Signed)

## 2017-01-15 NOTE — Progress Notes (Signed)
[place] OFFICE PROGRESS NOTE   Diagnosis:  Left breast cancer Oncology History   Cancer Staging Breast cancer of upper-outer quadrant of left female breast [facility]) Staging form: Breast, AJCC 8th Edition - Clinical stage from 08/06/2016: Stage IIB (cT2(m), cN1, cM0, G2, ER: Positive, PR: Negative, HER2: Negative) - Signed by Truitt Merle, MD on 08/27/2016       Breast cancer of upper-outer quadrant of left female breast ([place])   08/06/2016 Mammogram    MM DIAG BREAST TOMO BILATERAL AND [place] BREAT STD UNI LEFT INC AXILLA 08/06/16 IMPRESSION: 1. Two adjacent (nearly contiguous) irregular masses within the LEFT breast at the 1 o'clock axis, 3 cm from the nipple, measuring 2.1 x 0.9 x 2 cm and 2.2 x 1.2 x 2 cm respectively, OVERALL measuring 5.1 cm extent, corresponding to the mammographic findings. 2. Additional mass within the LEFT breast at the 2 o'clock axis, 7 cm from the nipple, corresponding to the additional mass seen on mammogram within the lower axilla, measuring 1.1 x 0.8 x 1.1 cm, most suggestive of an enlarged/ morphologically abnormal lymph node (intramammary versus lower axilla). 3. Additional enlarged/morphologically abnormal lymph node in the more superior LEFT axilla. 4. No evidence of malignancy within the RIGHT breast.      08/06/2016 Initial Biopsy    1. Breast, left, needle core biopsy, 1:00 o'clock - INVASIVE DUCTAL CARCINOMA WITH PAPILLARY FEATURES. - GRADE 2. 2. Lymph node, needle/core biopsy, left axilla - DUCTAL CARCINOMA. - MORPHOLOGICALLY SIMILAR TO PART 1.      08/06/2016 Receptors her2    ER 100% POSITIVE PR 0% NEGATIVE HER2 NEGATIVE Ki67 15%      08/06/2016 Initial Diagnosis    Breast cancer metastasized to axillary lymph node, left ([place])      08/06/2016 Miscellaneous    mamaprint showed high risk disease, luminal type       08/28/2016 -  Anti-estrogen oral therapy    Letrozole 2.77m daily, held during her  neoadjuvant chemo       09/23/2016 Echocardiogram         10/01/2016 -  Neo-Adjuvant Chemotherapy    ddAC, every 2 weeks X4, followed by weekly Taxol X12. Will change Taxol to Abraxane after 12/03/16 to avoid premedication dexamethasone which could contribute to her depression.       INTERVAL HISTORY:   Ms. SJergensreturns as scheduled. She completed a cycle of weekly Abraxane 01/07/2017. She denies nausea/vomiting. No mouth sores. No diarrhea or constipation. No numbness or tingling in her hands or feet. She has a good appetite.  Objective:  Vital signs in last 24 hours:  Blood pressure (!) 120/59, pulse 80, temperature 97.9 F (36.6 C), temperature source Oral, resp. rate 18, height 5' 4.75" (1.645 m), weight 121 lb (54.9 kg), SpO2 100 %.    HEENT: No thrush or ulcers. Lymphatics: No palpable axillary lymph nodes. Resp: Lungs clear bilaterally. Cardio: Regular rate and rhythm. GI: Abdomen soft and nontender. No hepatomegaly. Vascular: No leg edema. Calves soft and nontender. Neuro: Vibratory sense intact over the fingertips per tuning fork exam.  Skin: No rash. Port-A-Cath without erythema.    Lab Results:  Lab Results  Component Value Date   WBC 4.0 01/15/2017   HGB 10.9 (L) 01/15/2017   HCT 32.4 (L) 01/15/2017   MCV 97.9 01/15/2017   PLT 215 01/15/2017   NEUTROABS 2.8 01/15/2017    Imaging:  No results found.  Medications: I have reviewed the patient's current medications.  Assessment/Plan: 1.  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 She has completed 5 cycles of Abraxane to date. 2. Depression, question bipolar 3. Taste change and weight loss.  Weight gain noted 01/15/2017. 4. Hypothyroidism.   Disposition: Ms. Aispuro appears stable. She has completed 2 cycles of weekly Taxol and 5 cycles of weekly Abraxane. Plan to proceed with Abraxane today as scheduled. She has completed 7 weekly treatments. A total of 12 weekly treatments planned. She will return for treatment next week, follow-up and treatment in 2 weeks. She will contact the office in the interim with any problems.    Ned Card ANP/GNP-BC   01/15/2017  11:14 AM

## 2017-01-15 NOTE — Telephone Encounter (Signed)
Gave patient avs report and appointments for July and August.  °

## 2017-01-15 NOTE — Patient Instructions (Addendum)
[facility] Discharge Instructions for Patients Receiving Chemotherapy  Today you received the following chemotherapy agents ABRAXANE To help prevent nausea and vomiting after your treatment, we encourage you to take your nausea medication nausea meds as prescribed  prochlorperazine (COMPAZINE) 10 MG tablet 30 tablet 1 10/01/2016    Sig - Route: Take 1 tablet (10 mg total) by mouth every 6 (six) hours as needed (Nausea or vomiting). - Oral   Patient not taking: Reported on 12/03/2016        ondansetron (ZOFRAN) 8 MG tablet 30 tablet 1 10/01/2016    Sig - Route: Take 1 tablet (8 mg total) by mouth 2 (two) times daily as needed. Start on the third day after chemotherapy. - Oral      If you develop nausea and vomiting that is not controlled by your nausea medication, call the clinic.   BELOW ARE SYMPTOMS THAT SHOULD BE REPORTED IMMEDIATELY:  *FEVER GREATER THAN 100.5 F  *CHILLS WITH OR WITHOUT FEVER  NAUSEA AND VOMITING THAT IS NOT CONTROLLED WITH YOUR NAUSEA MEDICATION  *UNUSUAL SHORTNESS OF BREATH  *UNUSUAL BRUISING OR BLEEDING  TENDERNESS IN MOUTH AND THROAT WITH OR WITHOUT PRESENCE OF ULCERS  *URINARY PROBLEMS  *BOWEL PROBLEMS  UNUSUAL RASH Items with * indicate a potential emergency and should be followed up as soon as possible.  Feel free to call the clinic you have any questions or concerns. The clinic phone number is [phone].  Please show the [facility] at check-in to the Emergency Department and triage nurse.

## 2017-01-22 ENCOUNTER — Ambulatory Visit: Payer: BLUE CROSS/BLUE SHIELD

## 2017-01-22 ENCOUNTER — Other Ambulatory Visit (HOSPITAL_BASED_OUTPATIENT_CLINIC_OR_DEPARTMENT_OTHER): Payer: BLUE CROSS/BLUE SHIELD

## 2017-01-22 ENCOUNTER — Ambulatory Visit (HOSPITAL_BASED_OUTPATIENT_CLINIC_OR_DEPARTMENT_OTHER): Payer: BLUE CROSS/BLUE SHIELD

## 2017-01-22 VITALS — BP 118/66 | HR 86 | Temp 97.7°F | Resp 18

## 2017-01-22 DIAGNOSIS — C50412 Malignant neoplasm of upper-outer quadrant of left female breast: Secondary | ICD-10-CM

## 2017-01-22 DIAGNOSIS — Z170 Estrogen receptor positive status [ER+]: Principal | ICD-10-CM

## 2017-01-22 DIAGNOSIS — Z5111 Encounter for antineoplastic chemotherapy: Secondary | ICD-10-CM | POA: Diagnosis not present

## 2017-01-22 DIAGNOSIS — Z95828 Presence of other vascular implants and grafts: Secondary | ICD-10-CM

## 2017-01-22 LAB — COMPREHENSIVE METABOLIC PANEL
ALBUMIN: 3.9 g/dL (ref 3.5–5.0)
ALK PHOS: 71 U/L (ref 40–150)
ALT: 10 U/L (ref 0–55)
AST: 24 U/L (ref 5–34)
Anion Gap: 10 mEq/L (ref 3–11)
BILIRUBIN TOTAL: 0.37 mg/dL (ref 0.20–1.20)
BUN: 10.9 mg/dL (ref 7.0–26.0)
CO2: 23 meq/L (ref 22–29)
CREATININE: 0.7 mg/dL (ref 0.6–1.1)
Calcium: 9 mg/dL (ref 8.4–10.4)
Chloride: 104 mEq/L (ref 98–109)
EGFR: 86 mL/min/{1.73_m2} — AB (ref >90)
GLUCOSE: 183 mg/dL — AB (ref 70–140)
Potassium: 3.6 mEq/L (ref 3.5–5.1)
SODIUM: 137 meq/L (ref 136–145)
TOTAL PROTEIN: 6.5 g/dL (ref 6.4–8.3)

## 2017-01-22 LAB — CBC WITH DIFFERENTIAL/PLATELET
BASO%: 0.6 % (ref 0.0–2.0)
Basophils Absolute: 0 10*3/uL (ref 0.0–0.1)
EOS%: 0.6 % (ref 0.0–7.0)
Eosinophils Absolute: 0 10*3/uL (ref 0.0–0.5)
HCT: 34 % — ABNORMAL LOW (ref 34.8–46.6)
HEMOGLOBIN: 11.5 g/dL — AB (ref 11.6–15.9)
LYMPH#: 0.8 10*3/uL — AB (ref 0.9–3.3)
LYMPH%: 21.4 % (ref 14.0–49.7)
MCH: 32.6 pg (ref 25.1–34.0)
MCHC: 33.8 g/dL (ref 31.5–36.0)
MCV: 96.3 fL (ref 79.5–101.0)
MONO#: 0.2 10*3/uL (ref 0.1–0.9)
MONO%: 6.8 % (ref 0.0–14.0)
NEUT#: 2.5 10*3/uL (ref 1.5–6.5)
NEUT%: 70.6 % (ref 38.4–76.8)
Platelets: 188 10*3/uL (ref 145–400)
RBC: 3.53 10*6/uL — ABNORMAL LOW (ref 3.70–5.45)
RDW: 12.9 % (ref 11.2–14.5)
WBC: 3.6 10*3/uL — ABNORMAL LOW (ref 3.9–10.3)

## 2017-01-22 MED ORDER — SODIUM CHLORIDE 0.9% FLUSH
10.0000 mL | Freq: Once | INTRAVENOUS | Status: AC
  Administered 2017-01-22: 10 mL
  Filled 2017-01-22: qty 10

## 2017-01-22 MED ORDER — SODIUM CHLORIDE 0.9% FLUSH
10.0000 mL | INTRAVENOUS | Status: DC | PRN
  Administered 2017-01-22: 10 mL
  Filled 2017-01-22: qty 10

## 2017-01-22 MED ORDER — PROCHLORPERAZINE MALEATE 10 MG PO TABS
10.0000 mg | ORAL_TABLET | Freq: Once | ORAL | Status: AC
  Administered 2017-01-22: 10 mg via ORAL

## 2017-01-22 MED ORDER — PROCHLORPERAZINE MALEATE 10 MG PO TABS
ORAL_TABLET | ORAL | Status: AC
  Filled 2017-01-22: qty 1

## 2017-01-22 MED ORDER — PACLITAXEL PROTEIN-BOUND CHEMO INJECTION 100 MG
80.0000 mg/m2 | Freq: Once | INTRAVENOUS | Status: AC
  Administered 2017-01-22: 125 mg via INTRAVENOUS
  Filled 2017-01-22: qty 25

## 2017-01-22 MED ORDER — HEPARIN SOD (PORK) LOCK FLUSH 100 UNIT/ML IV SOLN
500.0000 [IU] | Freq: Once | INTRAVENOUS | Status: AC | PRN
  Administered 2017-01-22: 500 [IU]
  Filled 2017-01-22: qty 5

## 2017-01-22 MED ORDER — SODIUM CHLORIDE 0.9 % IV SOLN
Freq: Once | INTRAVENOUS | Status: AC
  Administered 2017-01-22: 09:00:00 via INTRAVENOUS

## 2017-01-22 NOTE — Patient Instructions (Signed)
[facility] Discharge Instructions for Patients Receiving Chemotherapy  Today you received the following chemotherapy agents: Abraxane   To help prevent nausea and vomiting after your treatment, we encourage you to take your nausea medication as directed.    If you develop nausea and vomiting that is not controlled by your nausea medication, call the clinic.   BELOW ARE SYMPTOMS THAT SHOULD BE REPORTED IMMEDIATELY:  *FEVER GREATER THAN 100.5 F  *CHILLS WITH OR WITHOUT FEVER  NAUSEA AND VOMITING THAT IS NOT CONTROLLED WITH YOUR NAUSEA MEDICATION  *UNUSUAL SHORTNESS OF BREATH  *UNUSUAL BRUISING OR BLEEDING  TENDERNESS IN MOUTH AND THROAT WITH OR WITHOUT PRESENCE OF ULCERS  *URINARY PROBLEMS  *BOWEL PROBLEMS  UNUSUAL RASH Items with * indicate a potential emergency and should be followed up as soon as possible.  Feel free to call the clinic you have any questions or concerns. The clinic phone number is [phone].  Please show the CHEMO ALERT CARD at check-in to the Emergency Department and triage nurse.   
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
## 2017-01-29 ENCOUNTER — Ambulatory Visit: Payer: BLUE CROSS/BLUE SHIELD

## 2017-01-29 ENCOUNTER — Ambulatory Visit (HOSPITAL_BASED_OUTPATIENT_CLINIC_OR_DEPARTMENT_OTHER): Payer: BLUE CROSS/BLUE SHIELD

## 2017-01-29 ENCOUNTER — Ambulatory Visit (HOSPITAL_BASED_OUTPATIENT_CLINIC_OR_DEPARTMENT_OTHER): Payer: BLUE CROSS/BLUE SHIELD | Admitting: Hematology

## 2017-01-29 ENCOUNTER — Other Ambulatory Visit (HOSPITAL_BASED_OUTPATIENT_CLINIC_OR_DEPARTMENT_OTHER): Payer: BLUE CROSS/BLUE SHIELD

## 2017-01-29 ENCOUNTER — Encounter: Payer: Self-pay | Admitting: Hematology

## 2017-01-29 VITALS — BP 120/73 | HR 81 | Temp 97.8°F | Resp 18 | Ht 64.75 in | Wt 121.3 lb

## 2017-01-29 DIAGNOSIS — C50412 Malignant neoplasm of upper-outer quadrant of left female breast: Secondary | ICD-10-CM

## 2017-01-29 DIAGNOSIS — Z5111 Encounter for antineoplastic chemotherapy: Secondary | ICD-10-CM

## 2017-01-29 DIAGNOSIS — Z170 Estrogen receptor positive status [ER+]: Secondary | ICD-10-CM

## 2017-01-29 DIAGNOSIS — F320 Major depressive disorder, single episode, mild: Secondary | ICD-10-CM

## 2017-01-29 DIAGNOSIS — E039 Hypothyroidism, unspecified: Secondary | ICD-10-CM

## 2017-01-29 DIAGNOSIS — F329 Major depressive disorder, single episode, unspecified: Secondary | ICD-10-CM | POA: Diagnosis not present

## 2017-01-29 DIAGNOSIS — Z95828 Presence of other vascular implants and grafts: Secondary | ICD-10-CM

## 2017-01-29 LAB — CBC WITH DIFFERENTIAL/PLATELET
BASO%: 0.7 % (ref 0.0–2.0)
Basophils Absolute: 0 10*3/uL (ref 0.0–0.1)
EOS ABS: 0 10*3/uL (ref 0.0–0.5)
EOS%: 1 % (ref 0.0–7.0)
HCT: 34.9 % (ref 34.8–46.6)
HEMOGLOBIN: 12.1 g/dL (ref 11.6–15.9)
LYMPH%: 24.5 % (ref 14.0–49.7)
MCH: 32.8 pg (ref 25.1–34.0)
MCHC: 34.6 g/dL (ref 31.5–36.0)
MCV: 94.9 fL (ref 79.5–101.0)
MONO#: 0.2 10*3/uL (ref 0.1–0.9)
MONO%: 6.7 % (ref 0.0–14.0)
NEUT#: 2.3 10*3/uL (ref 1.5–6.5)
NEUT%: 67.1 % (ref 38.4–76.8)
PLATELETS: 213 10*3/uL (ref 145–400)
RBC: 3.68 10*6/uL — ABNORMAL LOW (ref 3.70–5.45)
RDW: 13.3 % (ref 11.2–14.5)
WBC: 3.4 10*3/uL — ABNORMAL LOW (ref 3.9–10.3)
lymph#: 0.8 10*3/uL — ABNORMAL LOW (ref 0.9–3.3)

## 2017-01-29 LAB — COMPREHENSIVE METABOLIC PANEL
ALBUMIN: 3.9 g/dL (ref 3.5–5.0)
ALK PHOS: 67 U/L (ref 40–150)
ALT: 10 U/L (ref 0–55)
ANION GAP: 9 meq/L (ref 3–11)
AST: 19 U/L (ref 5–34)
BILIRUBIN TOTAL: 0.35 mg/dL (ref 0.20–1.20)
BUN: 12.8 mg/dL (ref 7.0–26.0)
CO2: 25 mEq/L (ref 22–29)
CREATININE: 0.7 mg/dL (ref 0.6–1.1)
Calcium: 9 mg/dL (ref 8.4–10.4)
Chloride: 108 mEq/L (ref 98–109)
GLUCOSE: 144 mg/dL — AB (ref 70–140)
Potassium: 3.2 mEq/L — ABNORMAL LOW (ref 3.5–5.1)
SODIUM: 142 meq/L (ref 136–145)
TOTAL PROTEIN: 6.5 g/dL (ref 6.4–8.3)

## 2017-01-29 MED ORDER — PROCHLORPERAZINE MALEATE 10 MG PO TABS
10.0000 mg | ORAL_TABLET | Freq: Once | ORAL | Status: AC
  Administered 2017-01-29: 10 mg via ORAL

## 2017-01-29 MED ORDER — SODIUM CHLORIDE 0.9 % IV SOLN
Freq: Once | INTRAVENOUS | Status: AC
  Administered 2017-01-29: 12:00:00 via INTRAVENOUS

## 2017-01-29 MED ORDER — HEPARIN SOD (PORK) LOCK FLUSH 100 UNIT/ML IV SOLN
500.0000 [IU] | Freq: Once | INTRAVENOUS | Status: AC | PRN
  Administered 2017-01-29: 500 [IU]
  Filled 2017-01-29: qty 5

## 2017-01-29 MED ORDER — POTASSIUM CHLORIDE CRYS ER 20 MEQ PO TBCR: 20.0000 meq | EXTENDED_RELEASE_TABLET | Freq: Two times a day (BID) | ORAL | 1 refills | Status: DC

## 2017-01-29 MED ORDER — PROCHLORPERAZINE MALEATE 10 MG PO TABS
ORAL_TABLET | ORAL | Status: AC
  Filled 2017-01-29: qty 1

## 2017-01-29 MED ORDER — SODIUM CHLORIDE 0.9% FLUSH
10.0000 mL | INTRAVENOUS | Status: DC | PRN
  Administered 2017-01-29: 10 mL
  Filled 2017-01-29: qty 10

## 2017-01-29 MED ORDER — PACLITAXEL PROTEIN-BOUND CHEMO INJECTION 100 MG
80.0000 mg/m2 | Freq: Once | INTRAVENOUS | Status: AC
  Administered 2017-01-29: 125 mg via INTRAVENOUS
  Filled 2017-01-29: qty 25

## 2017-01-29 MED ORDER — SODIUM CHLORIDE 0.9% FLUSH
10.0000 mL | Freq: Once | INTRAVENOUS | Status: AC
  Administered 2017-01-29: 10 mL
  Filled 2017-01-29: qty 10

## 2017-01-29 NOTE — Patient Instructions (Signed)
[facility] Discharge Instructions for Patients Receiving Chemotherapy  Today you received the following chemotherapy agents: Abraxane   To help prevent nausea and vomiting after your treatment, we encourage you to take your nausea medication as directed.    If you develop nausea and vomiting that is not controlled by your nausea medication, call the clinic.   BELOW ARE SYMPTOMS THAT SHOULD BE REPORTED IMMEDIATELY:  *FEVER GREATER THAN 100.5 F  *CHILLS WITH OR WITHOUT FEVER  NAUSEA AND VOMITING THAT IS NOT CONTROLLED WITH YOUR NAUSEA MEDICATION  *UNUSUAL SHORTNESS OF BREATH  *UNUSUAL BRUISING OR BLEEDING  TENDERNESS IN MOUTH AND THROAT WITH OR WITHOUT PRESENCE OF ULCERS  *URINARY PROBLEMS  *BOWEL PROBLEMS  UNUSUAL RASH Items with * indicate a potential emergency and should be followed up as soon as possible.  Feel free to call the clinic you have any questions or concerns. The clinic phone number is [phone].  Please show the CHEMO ALERT CARD at check-in to the Emergency Department and triage nurse.   

## 2017-02-01 ENCOUNTER — Telehealth: Payer: Self-pay

## 2017-02-01 NOTE — Telephone Encounter (Signed)
Called patient and left appointment date of 8/31, and contact information for MRI. Other appointments remain the same.

## 2017-02-05 ENCOUNTER — Other Ambulatory Visit (HOSPITAL_BASED_OUTPATIENT_CLINIC_OR_DEPARTMENT_OTHER): Payer: BLUE CROSS/BLUE SHIELD

## 2017-02-05 ENCOUNTER — Ambulatory Visit: Payer: BLUE CROSS/BLUE SHIELD

## 2017-02-05 ENCOUNTER — Ambulatory Visit (HOSPITAL_BASED_OUTPATIENT_CLINIC_OR_DEPARTMENT_OTHER): Payer: BLUE CROSS/BLUE SHIELD

## 2017-02-05 ENCOUNTER — Encounter: Payer: Self-pay | Admitting: *Deleted

## 2017-02-05 VITALS — BP 109/69 | HR 91 | Temp 97.6°F | Resp 16

## 2017-02-05 DIAGNOSIS — Z95828 Presence of other vascular implants and grafts: Secondary | ICD-10-CM

## 2017-02-05 DIAGNOSIS — Z170 Estrogen receptor positive status [ER+]: Principal | ICD-10-CM

## 2017-02-05 DIAGNOSIS — C50412 Malignant neoplasm of upper-outer quadrant of left female breast: Secondary | ICD-10-CM

## 2017-02-05 DIAGNOSIS — Z5111 Encounter for antineoplastic chemotherapy: Secondary | ICD-10-CM | POA: Diagnosis not present

## 2017-02-05 LAB — COMPREHENSIVE METABOLIC PANEL
ALT: 14 U/L (ref 0–55)
AST: 22 U/L (ref 5–34)
Albumin: 3.9 g/dL (ref 3.5–5.0)
Alkaline Phosphatase: 68 U/L (ref 40–150)
Anion Gap: 8 mEq/L (ref 3–11)
BUN: 8.6 mg/dL (ref 7.0–26.0)
CALCIUM: 9.2 mg/dL (ref 8.4–10.4)
CHLORIDE: 107 meq/L (ref 98–109)
CO2: 26 mEq/L (ref 22–29)
CREATININE: 0.7 mg/dL (ref 0.6–1.1)
EGFR: 89 mL/min/{1.73_m2} — ABNORMAL LOW (ref >90)
GLUCOSE: 156 mg/dL — AB (ref 70–140)
Potassium: 3.9 mEq/L (ref 3.5–5.1)
SODIUM: 141 meq/L (ref 136–145)
Total Bilirubin: 0.43 mg/dL (ref 0.20–1.20)
Total Protein: 6.6 g/dL (ref 6.4–8.3)

## 2017-02-05 LAB — CBC WITH DIFFERENTIAL/PLATELET
BASO%: 1 % (ref 0.0–2.0)
Basophils Absolute: 0 10*3/uL (ref 0.0–0.1)
EOS%: 1.2 % (ref 0.0–7.0)
Eosinophils Absolute: 0 10*3/uL (ref 0.0–0.5)
HCT: 34.5 % — ABNORMAL LOW (ref 34.8–46.6)
HGB: 12 g/dL (ref 11.6–15.9)
LYMPH%: 22 % (ref 14.0–49.7)
MCH: 32.9 pg (ref 25.1–34.0)
MCHC: 34.8 g/dL (ref 31.5–36.0)
MCV: 94.6 fL (ref 79.5–101.0)
MONO#: 0.2 10*3/uL (ref 0.1–0.9)
MONO%: 6.2 % (ref 0.0–14.0)
NEUT#: 2.3 10*3/uL (ref 1.5–6.5)
NEUT%: 69.6 % (ref 38.4–76.8)
Platelets: 235 10*3/uL (ref 145–400)
RBC: 3.65 10*6/uL — ABNORMAL LOW (ref 3.70–5.45)
RDW: 13.1 % (ref 11.2–14.5)
WBC: 3.4 10*3/uL — ABNORMAL LOW (ref 3.9–10.3)
lymph#: 0.7 10*3/uL — ABNORMAL LOW (ref 0.9–3.3)

## 2017-02-05 MED ORDER — HEPARIN SOD (PORK) LOCK FLUSH 100 UNIT/ML IV SOLN
500.0000 [IU] | Freq: Once | INTRAVENOUS | Status: AC | PRN
  Administered 2017-02-05: 500 [IU]
  Filled 2017-02-05: qty 5

## 2017-02-05 MED ORDER — SODIUM CHLORIDE 0.9 % IV SOLN
Freq: Once | INTRAVENOUS | Status: AC
  Administered 2017-02-05: 09:00:00 via INTRAVENOUS

## 2017-02-05 MED ORDER — PACLITAXEL PROTEIN-BOUND CHEMO INJECTION 100 MG
80.0000 mg/m2 | Freq: Once | INTRAVENOUS | Status: AC
  Administered 2017-02-05: 125 mg via INTRAVENOUS
  Filled 2017-02-05: qty 25

## 2017-02-05 MED ORDER — PROCHLORPERAZINE MALEATE 10 MG PO TABS
10.0000 mg | ORAL_TABLET | Freq: Once | ORAL | Status: AC
  Administered 2017-02-05: 10 mg via ORAL

## 2017-02-05 MED ORDER — SODIUM CHLORIDE 0.9% FLUSH
10.0000 mL | Freq: Once | INTRAVENOUS | Status: AC
  Administered 2017-02-05: 10 mL
  Filled 2017-02-05: qty 10

## 2017-02-05 MED ORDER — SODIUM CHLORIDE 0.9% FLUSH
10.0000 mL | INTRAVENOUS | Status: DC | PRN
  Administered 2017-02-05: 10 mL
  Filled 2017-02-05: qty 10

## 2017-02-05 MED ORDER — PROCHLORPERAZINE MALEATE 10 MG PO TABS
ORAL_TABLET | ORAL | Status: AC
  Filled 2017-02-05: qty 1

## 2017-02-05 NOTE — Progress Notes (Signed)
[place] Work  Clinical Social Work was referred by need for CSW follow up.  Clinical Social Worker met with patient and her sister at [facility] during treatment. They were aware pt approved for [place] great for medical bill assistance. They plan to bring bills to have CSW submit on their behalf in the next week or two. Pt in good spirits today, smiling and much more upbeat than CSW has seen in the past. They denied other needs and were excited by this news.   Clinical Social Work interventions: Resource assistance  Loren Racer, [employer], OSW-C Clinical Social Worker [place]  [place] Phone: [phone] Fax: [phone]

## 2017-02-05 NOTE — Patient Instructions (Signed)
[facility] Discharge Inst[facility] for Patients Receiving Chemotherapy  Today you received the following chemotherapy agents: Abraxane   To help prevent nausea and vomiting after your treatment, we encourage you to take your nausea medication as directed.    If you develop nausea and vomiting that is not controlled by your nausea medication, call the clinic.   BELOW ARE SYMPTOMS THAT SHOULD BE REPORTED IMMEDIATELY:  *FEVER GREATER THAN 100.5 F  *CHILLS WITH OR WITHOUT FEVER  NAUSEA AND VOMITING THAT IS NOT CONTROLLED WITH YOUR NAUSEA MEDICATION  *UNUSUAL SHORTNESS OF BREATH  *UNUSUAL BRUISING OR BLEEDING  TENDERNESS IN MOUTH AND THROAT WITH OR WITHOUT PRESENCE OF ULCERS  *URINARY PROBLEMS  *BOWEL PROBLEMS  UNUSUAL RASH Items with * indicate a potential emergency and should be followed up as soon as possible.  Feel free to call the clinic you have any questions or concerns. The clinic phone number is [phone].  Please show the CHEMO ALERT CARD at check-in to the Emergency Department and triage nurse.   

## 2017-02-05 NOTE — Patient Instructions (Signed)
Implanted Port Home Guide An implanted port is a type of central line that is placed under the skin. Central lines are used to provide IV access when treatment or nutrition needs to be given through a person's veins. Implanted ports are used for long-term IV access. An implanted port may be placed because:  You need IV medicine that would be irritating to the small veins in your hands or arms.  You need long-term IV medicines, such as antibiotics.  You need IV nutrition for a long period.  You need frequent blood draws for lab tests.  You need dialysis.  Implanted ports are usually placed in the chest area, but they can also be placed in the upper arm, the abdomen, or the leg. An implanted port has two main parts:  Reservoir. The reservoir is round and will appear as a small, raised area under your skin. The reservoir is the part where a needle is inserted to give medicines or draw blood.  Catheter. The catheter is a thin, flexible tube that extends from the reservoir. The catheter is placed into a large vein. Medicine that is inserted into the reservoir goes into the catheter and then into the vein.  How will I care for my incision site? Do not get the incision site wet. Bathe or shower as directed by your health care provider. How is my port accessed? Special steps must be taken to access the port:  Before the port is accessed, a numbing cream can be placed on the skin. This helps numb the skin over the port site.  Your health care provider uses a sterile technique to access the port. ? Your health care provider must put on a mask and sterile gloves. ? The skin over your port is cleaned carefully with an antiseptic and allowed to dry. ? The port is gently pinched between sterile gloves, and a needle is inserted into the port.  Only "non-coring" port needles should be used to access the port. Once the port is accessed, a blood return should be checked. This helps ensure that the port  is in the vein and is not clogged.  If your port needs to remain accessed for a constant infusion, a clear (transparent) bandage will be placed over the needle site. The bandage and needle will need to be changed every week, or as directed by your health care provider.  Keep the bandage covering the needle clean and dry. Do not get it wet. Follow your health care provider's instructions on how to take a shower or bath while the port is accessed.  If your port does not need to stay accessed, no bandage is needed over the port.  What is flushing? Flushing helps keep the port from getting clogged. Follow your health care provider's instructions on how and when to flush the port. Ports are usually flushed with saline solution or a medicine called heparin. The need for flushing will depend on how the port is used.  If the port is used for intermittent medicines or blood draws, the port will need to be flushed: ? After medicines have been given. ? After blood has been drawn. ? As part of routine maintenance.  If a constant infusion is running, the port may not need to be flushed.  How long will my port stay implanted? The port can stay in for as long as your health care provider thinks it is needed. When it is time for the port to come out, surgery will be   done to remove it. The procedure is similar to the one performed when the port was put in. When should I seek immediate medical care? When you have an implanted port, you should seek immediate medical care if:  You notice a bad smell coming from the incision site.  You have swelling, redness, or drainage at the incision site.  You have more swelling or pain at the port site or the surrounding area.  You have a fever that is not controlled with medicine.  This information is not intended to replace advice given to you by your health care provider. Make sure you discuss any questions you have with your health care provider. Document  Released: 06/15/2005 Document Revised: 11/21/2015 Document Reviewed: 02/20/2013 Elsevier Interactive Patient Education  2017 Elsevier Inc.  

## 2017-02-08 ENCOUNTER — Other Ambulatory Visit: Payer: Self-pay | Admitting: Hematology
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
## 2017-02-12 ENCOUNTER — Other Ambulatory Visit (HOSPITAL_BASED_OUTPATIENT_CLINIC_OR_DEPARTMENT_OTHER): Payer: BLUE CROSS/BLUE SHIELD

## 2017-02-12 ENCOUNTER — Ambulatory Visit (HOSPITAL_BASED_OUTPATIENT_CLINIC_OR_DEPARTMENT_OTHER): Payer: BLUE CROSS/BLUE SHIELD

## 2017-02-12 ENCOUNTER — Telehealth: Payer: Self-pay | Admitting: Hematology

## 2017-02-12 ENCOUNTER — Ambulatory Visit (HOSPITAL_BASED_OUTPATIENT_CLINIC_OR_DEPARTMENT_OTHER): Payer: BLUE CROSS/BLUE SHIELD | Admitting: Hematology

## 2017-02-12 ENCOUNTER — Ambulatory Visit: Payer: BLUE CROSS/BLUE SHIELD

## 2017-02-12 ENCOUNTER — Encounter: Payer: Self-pay | Admitting: Hematology

## 2017-02-12 VITALS — BP 110/69 | HR 82 | Temp 98.0°F | Resp 20 | Ht 64.75 in | Wt 124.1 lb

## 2017-02-12 DIAGNOSIS — Z170 Estrogen receptor positive status [ER+]: Principal | ICD-10-CM

## 2017-02-12 DIAGNOSIS — C773 Secondary and unspecified malignant neoplasm of axilla and upper limb lymph nodes: Secondary | ICD-10-CM

## 2017-02-12 DIAGNOSIS — C50412 Malignant neoplasm of upper-outer quadrant of left female breast: Secondary | ICD-10-CM

## 2017-02-12 DIAGNOSIS — F329 Major depressive disorder, single episode, unspecified: Secondary | ICD-10-CM

## 2017-02-12 DIAGNOSIS — Z5111 Encounter for antineoplastic chemotherapy: Secondary | ICD-10-CM | POA: Diagnosis not present

## 2017-02-12 DIAGNOSIS — E039 Hypothyroidism, unspecified: Secondary | ICD-10-CM

## 2017-02-12 DIAGNOSIS — F320 Major depressive disorder, single episode, mild: Secondary | ICD-10-CM

## 2017-02-12 DIAGNOSIS — C50411 Malignant neoplasm of upper-outer quadrant of right female breast: Secondary | ICD-10-CM

## 2017-02-12 DIAGNOSIS — Z95828 Presence of other vascular implants and grafts: Secondary | ICD-10-CM

## 2017-02-12 LAB — COMPREHENSIVE METABOLIC PANEL
ALBUMIN: 3.8 g/dL (ref 3.5–5.0)
ALK PHOS: 68 U/L (ref 40–150)
ALT: 8 U/L (ref 0–55)
AST: 19 U/L (ref 5–34)
Anion Gap: 8 mEq/L (ref 3–11)
BILIRUBIN TOTAL: 0.31 mg/dL (ref 0.20–1.20)
BUN: 11.5 mg/dL (ref 7.0–26.0)
CALCIUM: 9 mg/dL (ref 8.4–10.4)
CO2: 27 mEq/L (ref 22–29)
CREATININE: 0.7 mg/dL (ref 0.6–1.1)
Chloride: 108 mEq/L (ref 98–109)
EGFR: >90 mL/min/{1.73_m2} (ref >90)
GLUCOSE: 121 mg/dL (ref 70–140)
POTASSIUM: 3.8 meq/L (ref 3.5–5.1)
SODIUM: 143 meq/L (ref 136–145)
TOTAL PROTEIN: 6.3 g/dL — AB (ref 6.4–8.3)

## 2017-02-12 LAB — CBC WITH DIFFERENTIAL/PLATELET
BASO%: 0.7 % (ref 0.0–2.0)
BASOS ABS: 0 10*3/uL (ref 0.0–0.1)
EOS ABS: 0 10*3/uL (ref 0.0–0.5)
EOS%: 1.4 % (ref 0.0–7.0)
HEMATOCRIT: 31.6 % — AB (ref 34.8–46.6)
HEMOGLOBIN: 10.7 g/dL — AB (ref 11.6–15.9)
LYMPH#: 1 10*3/uL (ref 0.9–3.3)
LYMPH%: 34.9 % (ref 14.0–49.7)
MCH: 32.1 pg (ref 25.1–34.0)
MCHC: 33.9 g/dL (ref 31.5–36.0)
MCV: 94.9 fL (ref 79.5–101.0)
MONO#: 0.2 10*3/uL (ref 0.1–0.9)
MONO%: 8.1 % (ref 0.0–14.0)
NEUT%: 54.9 % (ref 38.4–76.8)
NEUTROS ABS: 1.6 10*3/uL (ref 1.5–6.5)
Platelets: 209 10*3/uL (ref 145–400)
RBC: 3.33 10*6/uL — ABNORMAL LOW (ref 3.70–5.45)
RDW: 12.7 % (ref 11.2–14.5)
WBC: 2.8 10*3/uL — AB (ref 3.9–10.3)

## 2017-02-12 MED ORDER — SODIUM CHLORIDE 0.9% FLUSH
10.0000 mL | INTRAVENOUS | Status: DC | PRN
  Administered 2017-02-12: 10 mL
  Filled 2017-02-12: qty 10

## 2017-02-12 MED ORDER — PACLITAXEL PROTEIN-BOUND CHEMO INJECTION 100 MG
80.0000 mg/m2 | Freq: Once | INTRAVENOUS | Status: AC
  Administered 2017-02-12: 125 mg via INTRAVENOUS
  Filled 2017-02-12: qty 25

## 2017-02-12 MED ORDER — PROCHLORPERAZINE MALEATE 10 MG PO TABS
ORAL_TABLET | ORAL | Status: AC
  Filled 2017-02-12: qty 1

## 2017-02-12 MED ORDER — HEPARIN SOD (PORK) LOCK FLUSH 100 UNIT/ML IV SOLN
500.0000 [IU] | Freq: Once | INTRAVENOUS | Status: AC | PRN
  Administered 2017-02-12: 500 [IU]
  Filled 2017-02-12: qty 5

## 2017-02-12 MED ORDER — HEPARIN SOD (PORK) LOCK FLUSH 100 UNIT/ML IV SOLN
500.0000 [IU] | Freq: Once | INTRAVENOUS | Status: AC
  Administered 2017-02-12: 500 [IU] via INTRAVENOUS
  Filled 2017-02-12: qty 5

## 2017-02-12 MED ORDER — SODIUM CHLORIDE 0.9 % IJ SOLN
10.0000 mL | INTRAMUSCULAR | Status: DC | PRN
  Administered 2017-02-12: 10 mL via INTRAVENOUS
  Filled 2017-02-12: qty 10

## 2017-02-12 MED ORDER — PROCHLORPERAZINE MALEATE 10 MG PO TABS
10.0000 mg | ORAL_TABLET | Freq: Once | ORAL | Status: AC
  Administered 2017-02-12: 10 mg via ORAL

## 2017-02-12 MED ORDER — SODIUM CHLORIDE 0.9 % IV SOLN
Freq: Once | INTRAVENOUS | Status: AC
  Administered 2017-02-12: 11:00:00 via INTRAVENOUS

## 2017-02-12 NOTE — Addendum Note (Signed)
Addended by: Ardeen Garland on: 02/12/2017 01:40 PM   Modules accepted: Orders

## 2017-02-12 NOTE — Telephone Encounter (Signed)
Scheduled appt per 8/17 los - sent reminder letter in the mail and left message - appt scheduled for 3 week s - per Dr. Burr Medico 3 week date on LOS is wrong .

## 2017-02-12 NOTE — Patient Instructions (Signed)
[facility] Discharge Instructions for Patients Receiving Chemotherapy  Today you received the following chemotherapy agents ABRAXANE To help prevent nausea and vomiting after your treatment, we encourage you to take your nausea medication as prescribed.  If you develop nausea and vomiting that is not controlled by your nausea medication, call the clinic.   BELOW ARE SYMPTOMS THAT SHOULD BE REPORTED IMMEDIATELY:  *FEVER GREATER THAN 100.5 F  *CHILLS WITH OR WITHOUT FEVER  NAUSEA AND VOMITING THAT IS NOT CONTROLLED WITH YOUR NAUSEA MEDICATION  *UNUSUAL SHORTNESS OF BREATH  *UNUSUAL BRUISING OR BLEEDING  TENDERNESS IN MOUTH AND THROAT WITH OR WITHOUT PRESENCE OF ULCERS  *URINARY PROBLEMS  *BOWEL PROBLEMS  UNUSUAL RASH Items with * indicate a potential emergency and should be followed up as soon as possible.  Feel free to call the clinic you have any questions or concerns. The clinic phone number is [phone].  Please show the [place] at check-in to the Emergency Department and triage nurse.

## 2017-02-12 NOTE — Patient Instructions (Signed)
Implanted Port Home Guide An implanted port is a type of central line that is placed under the skin. Central lines are used to provide IV access when treatment or nutrition needs to be given through a person's veins. Implanted ports are used for long-term IV access. An implanted port may be placed because:  You need IV medicine that would be irritating to the small veins in your hands or arms.  You need long-term IV medicines, such as antibiotics.  You need IV nutrition for a long period.  You need frequent blood draws for lab tests.  You need dialysis.  Implanted ports are usually placed in the chest area, but they can also be placed in the upper arm, the abdomen, or the leg. An implanted port has two main parts:  Reservoir. The reservoir is round and will appear as a small, raised area under your skin. The reservoir is the part where a needle is inserted to give medicines or draw blood.  Catheter. The catheter is a thin, flexible tube that extends from the reservoir. The catheter is placed into a large vein. Medicine that is inserted into the reservoir goes into the catheter and then into the vein.  How will I care for my incision site? Do not get the incision site wet. Bathe or shower as directed by your health care provider. How is my port accessed? Special steps must be taken to access the port:  Before the port is accessed, a numbing cream can be placed on the skin. This helps numb the skin over the port site.  Your health care provider uses a sterile technique to access the port. ? Your health care provider must put on a mask and sterile gloves. ? The skin over your port is cleaned carefully with an antiseptic and allowed to dry. ? The port is gently pinched between sterile gloves, and a needle is inserted into the port.  Only "non-coring" port needles should be used to access the port. Once the port is accessed, a blood return should be checked. This helps ensure that the port  is in the vein and is not clogged.  If your port needs to remain accessed for a constant infusion, a clear (transparent) bandage will be placed over the needle site. The bandage and needle will need to be changed every week, or as directed by your health care provider.  Keep the bandage covering the needle clean and dry. Do not get it wet. Follow your health care provider's instructions on how to take a shower or bath while the port is accessed.  If your port does not need to stay accessed, no bandage is needed over the port.  What is flushing? Flushing helps keep the port from getting clogged. Follow your health care provider's instructions on how and when to flush the port. Ports are usually flushed with saline solution or a medicine called heparin. The need for flushing will depend on how the port is used.  If the port is used for intermittent medicines or blood draws, the port will need to be flushed: ? After medicines have been given. ? After blood has been drawn. ? As part of routine maintenance.  If a constant infusion is running, the port may not need to be flushed.  How long will my port stay implanted? The port can stay in for as long as your health care provider thinks it is needed. When it is time for the port to come out, surgery will be   done to remove it. The procedure is similar to the one performed when the port was put in. When should I seek immediate medical care? When you have an implanted port, you should seek immediate medical care if:  You notice a bad smell coming from the incision site.  You have swelling, redness, or drainage at the incision site.  You have more swelling or pain at the port site or the surrounding area.  You have a fever that is not controlled with medicine.  This information is not intended to replace advice given to you by your health care provider. Make sure you discuss any questions you have with your health care provider. Document  Released: 06/15/2005 Document Revised: 11/21/2015 Document Reviewed: 02/20/2013 Elsevier Interactive Patient Education  2017 Elsevier Inc.  

## 2017-02-13 ENCOUNTER — Ambulatory Visit
Admission: RE | Admit: 2017-02-13 | Discharge: 2017-02-13 | Disposition: A | Discharge status: Home / Self Care | Payer: BLUE CROSS/BLUE SHIELD | Source: Ambulatory Visit | Attending: Hematology | Admitting: Hematology

## 2017-02-13 DIAGNOSIS — C50412 Malignant neoplasm of upper-outer quadrant of left female breast: Secondary | ICD-10-CM

## 2017-02-13 DIAGNOSIS — Z170 Estrogen receptor positive status [ER+]: Principal | ICD-10-CM

## 2017-02-13 MED ORDER — GADOBENATE DIMEGLUMINE 529 MG/ML IV SOLN
15.0000 mL | Freq: Once | INTRAVENOUS | Status: AC | PRN
  Administered 2017-02-13: 11 mL via INTRAVENOUS

## 2017-02-15 ENCOUNTER — Telehealth: Payer: Self-pay | Admitting: Hematology

## 2017-02-15 NOTE — Telephone Encounter (Signed)
Message left with future appointment information 03-08-2017 beginning at 0900 am.  Returned patient's call in reference to coming in for both appointments scheduled 02-25-2017 and 03-03-2017 or needing to be seen after sees Dr.Hoxworth.

## 2017-02-15 NOTE — Telephone Encounter (Signed)
lvm to inform pt of r/s appt to 9/10 at 0900 per sch msg

## 2017-02-25 ENCOUNTER — Ambulatory Visit: Payer: BLUE CROSS/BLUE SHIELD | Admitting: Hematology

## 2017-02-25 ENCOUNTER — Other Ambulatory Visit: Payer: BLUE CROSS/BLUE SHIELD

## 2017-02-26 ENCOUNTER — Other Ambulatory Visit: Payer: BLUE CROSS/BLUE SHIELD

## 2017-02-26 ENCOUNTER — Ambulatory Visit: Payer: BLUE CROSS/BLUE SHIELD | Admitting: Hematology

## 2017-03-03 ENCOUNTER — Other Ambulatory Visit: Payer: BLUE CROSS/BLUE SHIELD

## 2017-03-03 ENCOUNTER — Ambulatory Visit: Payer: BLUE CROSS/BLUE SHIELD | Admitting: Hematology

## 2017-03-04 DIAGNOSIS — C50912 Malignant neoplasm of unspecified site of left female breast: Secondary | ICD-10-CM | POA: Diagnosis not present
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
## 2017-03-05 ENCOUNTER — Ambulatory Visit: Payer: Self-pay | Admitting: General Surgery

## 2017-03-05 DIAGNOSIS — C50912 Malignant neoplasm of unspecified site of left female breast: Secondary | ICD-10-CM

## 2017-03-08 ENCOUNTER — Other Ambulatory Visit (HOSPITAL_BASED_OUTPATIENT_CLINIC_OR_DEPARTMENT_OTHER): Payer: BLUE CROSS/BLUE SHIELD

## 2017-03-08 ENCOUNTER — Ambulatory Visit: Payer: BLUE CROSS/BLUE SHIELD

## 2017-03-08 ENCOUNTER — Ambulatory Visit (HOSPITAL_BASED_OUTPATIENT_CLINIC_OR_DEPARTMENT_OTHER): Payer: BLUE CROSS/BLUE SHIELD | Admitting: Hematology

## 2017-03-08 VITALS — BP 119/71 | HR 81 | Resp 18 | Ht 64.75 in | Wt 127.5 lb

## 2017-03-08 DIAGNOSIS — F320 Major depressive disorder, single episode, mild: Secondary | ICD-10-CM

## 2017-03-08 DIAGNOSIS — E039 Hypothyroidism, unspecified: Secondary | ICD-10-CM | POA: Diagnosis not present

## 2017-03-08 DIAGNOSIS — F329 Major depressive disorder, single episode, unspecified: Secondary | ICD-10-CM

## 2017-03-08 DIAGNOSIS — R21 Rash and other nonspecific skin eruption: Secondary | ICD-10-CM | POA: Diagnosis not present

## 2017-03-08 DIAGNOSIS — Z170 Estrogen receptor positive status [ER+]: Principal | ICD-10-CM

## 2017-03-08 DIAGNOSIS — C50412 Malignant neoplasm of upper-outer quadrant of left female breast: Secondary | ICD-10-CM

## 2017-03-08 DIAGNOSIS — Z95828 Presence of other vascular implants and grafts: Secondary | ICD-10-CM

## 2017-03-08 LAB — CBC WITH DIFFERENTIAL/PLATELET
BASO%: 0.9 % (ref 0.0–2.0)
BASOS ABS: 0 10*3/uL (ref 0.0–0.1)
EOS ABS: 0.2 10*3/uL (ref 0.0–0.5)
EOS%: 3.9 % (ref 0.0–7.0)
HCT: 36.4 % (ref 34.8–46.6)
HEMOGLOBIN: 12.5 g/dL (ref 11.6–15.9)
LYMPH%: 23.4 % (ref 14.0–49.7)
MCH: 31.9 pg (ref 25.1–34.0)
MCHC: 34.2 g/dL (ref 31.5–36.0)
MCV: 93.4 fL (ref 79.5–101.0)
MONO#: 0.5 10*3/uL (ref 0.1–0.9)
MONO%: 10 % (ref 0.0–14.0)
NEUT#: 3.1 10*3/uL (ref 1.5–6.5)
NEUT%: 61.8 % (ref 38.4–76.8)
PLATELETS: 211 10*3/uL (ref 145–400)
RBC: 3.9 10*6/uL (ref 3.70–5.45)
RDW: 13.1 % (ref 11.2–14.5)
WBC: 5.1 10*3/uL (ref 3.9–10.3)
lymph#: 1.2 10*3/uL (ref 0.9–3.3)

## 2017-03-08 LAB — COMPREHENSIVE METABOLIC PANEL
ALBUMIN: 3.9 g/dL (ref 3.5–5.0)
ALT: 12 U/L (ref 0–55)
ANION GAP: 8 meq/L (ref 3–11)
AST: 26 U/L (ref 5–34)
Alkaline Phosphatase: 75 U/L (ref 40–150)
BUN: 10.2 mg/dL (ref 7.0–26.0)
CALCIUM: 9.1 mg/dL (ref 8.4–10.4)
CO2: 25 mEq/L (ref 22–29)
Chloride: 108 mEq/L (ref 98–109)
Creatinine: 0.6 mg/dL (ref 0.6–1.1)
GLUCOSE: 98 mg/dL (ref 70–140)
Potassium: 4 mEq/L (ref 3.5–5.1)
SODIUM: 140 meq/L (ref 136–145)
Total Bilirubin: 0.36 mg/dL (ref 0.20–1.20)
Total Protein: 6.7 g/dL (ref 6.4–8.3)

## 2017-03-08 MED ORDER — SODIUM CHLORIDE 0.9% FLUSH
10.0000 mL | Freq: Once | INTRAVENOUS | Status: AC
  Administered 2017-03-08: 10 mL
  Filled 2017-03-08: qty 10

## 2017-03-08 MED ORDER — NYSTATIN 100000 UNIT/GM EX POWD
Freq: Four times a day (QID) | CUTANEOUS | 1 refills | Status: DC
Start: 2017-03-08 — End: 2017-06-04

## 2017-03-08 MED ORDER — HEPARIN SOD (PORK) LOCK FLUSH 100 UNIT/ML IV SOLN
500.0000 [IU] | Freq: Once | INTRAVENOUS | Status: AC
  Administered 2017-03-08: 500 [IU]
  Filled 2017-03-08: qty 5

## 2017-03-08 NOTE — Patient Instructions (Signed)
Implanted Port Home Guide An implanted port is a type of central line that is placed under the skin. Central lines are used to provide IV access when treatment or nutrition needs to be given through a person's veins. Implanted ports are used for long-term IV access. An implanted port may be placed because:  You need IV medicine that would be irritating to the small veins in your hands or arms.  You need long-term IV medicines, such as antibiotics.  You need IV nutrition for a long period.  You need frequent blood draws for lab tests.  You need dialysis.  Implanted ports are usually placed in the chest area, but they can also be placed in the upper arm, the abdomen, or the leg. An implanted port has two main parts:  Reservoir. The reservoir is round and will appear as a small, raised area under your skin. The reservoir is the part where a needle is inserted to give medicines or draw blood.  Catheter. The catheter is a thin, flexible tube that extends from the reservoir. The catheter is placed into a large vein. Medicine that is inserted into the reservoir goes into the catheter and then into the vein.  How will I care for my incision site? Do not get the incision site wet. Bathe or shower as directed by your health care provider. How is my port accessed? Special steps must be taken to access the port:  Before the port is accessed, a numbing cream can be placed on the skin. This helps numb the skin over the port site.  Your health care provider uses a sterile technique to access the port. ? Your health care provider must put on a mask and sterile gloves. ? The skin over your port is cleaned carefully with an antiseptic and allowed to dry. ? The port is gently pinched between sterile gloves, and a needle is inserted into the port.  Only "non-coring" port needles should be used to access the port. Once the port is accessed, a blood return should be checked. This helps ensure that the port  is in the vein and is not clogged.  If your port needs to remain accessed for a constant infusion, a clear (transparent) bandage will be placed over the needle site. The bandage and needle will need to be changed every week, or as directed by your health care provider.  Keep the bandage covering the needle clean and dry. Do not get it wet. Follow your health care provider's instructions on how to take a shower or bath while the port is accessed.  If your port does not need to stay accessed, no bandage is needed over the port.  What is flushing? Flushing helps keep the port from getting clogged. Follow your health care provider's instructions on how and when to flush the port. Ports are usually flushed with saline solution or a medicine called heparin. The need for flushing will depend on how the port is used.  If the port is used for intermittent medicines or blood draws, the port will need to be flushed: ? After medicines have been given. ? After blood has been drawn. ? As part of routine maintenance.  If a constant infusion is running, the port may not need to be flushed.  How long will my port stay implanted? The port can stay in for as long as your health care provider thinks it is needed. When it is time for the port to come out, surgery will be   done to remove it. The procedure is similar to the one performed when the port was put in. When should I seek immediate medical care? When you have an implanted port, you should seek immediate medical care if:  You notice a bad smell coming from the incision site.  You have swelling, redness, or drainage at the incision site.  You have more swelling or pain at the port site or the surrounding area.  You have a fever that is not controlled with medicine.  This information is not intended to replace advice given to you by your health care provider. Make sure you discuss any questions you have with your health care provider. Document  Released: 06/15/2005 Document Revised: 11/21/2015 Document Reviewed: 02/20/2013 Elsevier Interactive Patient Education  2017 Elsevier Inc.  

## 2017-03-09 ENCOUNTER — Encounter: Payer: Self-pay | Admitting: Hematology

## 2017-03-10 ENCOUNTER — Telehealth: Payer: Self-pay | Admitting: Hematology

## 2017-03-10 NOTE — Telephone Encounter (Signed)
Left message for patient regarding upcoming October appointments.  °

## 2017-03-11 ENCOUNTER — Telehealth: Payer: Self-pay

## 2017-03-11 NOTE — Telephone Encounter (Signed)
Pt called for assistance getting a surgery appt set up with Dr Excell Seltzer. Called Dr Excell Seltzer office, the pre-certification has been obtained and they should be calling pt in the next day or 2. LVM with pt with this info.

## 2017-03-15 ENCOUNTER — Other Ambulatory Visit: Payer: Self-pay | Admitting: General Surgery

## 2017-03-15 DIAGNOSIS — C50912 Malignant neoplasm of unspecified site of left female breast: Secondary | ICD-10-CM

## 2017-03-23 ENCOUNTER — Encounter: Payer: Self-pay | Admitting: Radiation Oncology

## 2017-03-24 NOTE — Pre-Procedure Instructions (Signed)
Angela [name]  03/24/2017      [id number] [address], [address] [place] [address] Phone: [phone] Fax: [phone]  [id number] [place], [place] [address] [place] [address] Phone: [phone] Fax: [phone]    Your procedure is scheduled on April 06, 2017.  Report to [facility] Admitting at 830 AM.  Call this number if you have problems the morning of surgery:  [phone]   Remember:  Do not eat food or drink liquids after midnight.  Take these medicines the morning of surgery with A SIP OF WATER thyroid (armour) . 7 days prior to surgery STOP taking any Aspirin, Aleve, Naproxen, Ibuprofen, Motrin, Advil, Goody's, BC's, all herbal medications, fish oil, and all vitamins   Do not wear jewelry, make-up or nail polish.  Do not wear lotions, powders, or perfumes, or deoderant.  Do not shave 48 hours prior to surgery.    Do not bring valuables to the hospital.  [facility] is not responsible for any belongings or valuables.  Contacts, dentures or bridgework may not be worn into surgery.  Leave your suitcase in the car.  After surgery it may be brought to your room.  For patients admitted to the hospital, discharge time will be determined by your treatment team.  Patients discharged the day of surgery will not be allowed to drive home.    Special instructions:   [place]- Preparing For Surgery  Before surgery, you can play an important role. Because skin is not sterile, your skin needs to be as free of germs as possible. You can reduce the number of germs on your skin by washing with CHG (chlorahexidine gluconate) Soap before surgery.  CHG is an antiseptic cleaner which kills germs and bonds with the skin to continue killing germs even after washing.  Please do not use if you have an allergy to CHG or antibacterial soaps. If your skin becomes reddened/irritated stop  using the CHG.  Do not shave (including legs and underarms) for at least 48 hours prior to first CHG shower. It is OK to shave your face.  Please follow these instructions carefully.   1. Shower the NIGHT BEFORE SURGERY and the MORNING OF SURGERY with CHG.   2. If you chose to wash your hair, wash your hair first as usual with your normal shampoo.  3. After you shampoo, rinse your hair and body thoroughly to remove the shampoo.  4. Use CHG as you would any other liquid soap. You can apply CHG directly to the skin and wash gently with a scrungie or a clean washcloth.   5. Apply the CHG Soap to your body ONLY FROM THE NECK DOWN.  Do not use on open wounds or open sores. Avoid contact with your eyes, ears, mouth and genitals (private parts). Wash genitals (private parts) with your normal soap.  6. Wash thoroughly, paying special attention to the area where your surgery will be performed.  7. Thoroughly rinse your body with warm water from the neck down.  8. DO NOT shower/wash with your normal soap after using and rinsing off the CHG Soap.  9. Pat yourself dry with a CLEAN TOWEL.   10. Wear CLEAN PAJAMAS   11. Place CLEAN SHEETS on your bed the night of your first shower and DO NOT SLEEP WITH PETS.    Day of Surgery: Do not apply any deodorants/lotions. Please  wear clean clothes to the hospital/surgery center.     Please read over the following fact sheets that you were given. Pain Booklet, Coughing and Deep Breathing and Surgical Site Infection Prevention

## 2017-03-25 ENCOUNTER — Encounter (HOSPITAL_COMMUNITY)
Admission: RE | Admit: 2017-03-25 | Discharge: 2017-03-25 | Disposition: A | Discharge status: Home / Self Care | Payer: BLUE CROSS/BLUE SHIELD | Source: Ambulatory Visit | Attending: General Surgery | Admitting: General Surgery

## 2017-03-25 ENCOUNTER — Other Ambulatory Visit: Payer: Self-pay | Admitting: Family

## 2017-03-25 ENCOUNTER — Encounter (HOSPITAL_COMMUNITY): Payer: Self-pay

## 2017-03-25 ENCOUNTER — Other Ambulatory Visit: Payer: Self-pay

## 2017-03-25 DIAGNOSIS — C50912 Malignant neoplasm of unspecified site of left female breast: Secondary | ICD-10-CM | POA: Diagnosis not present

## 2017-03-25 DIAGNOSIS — Z01810 Encounter for preprocedural cardiovascular examination: Secondary | ICD-10-CM | POA: Insufficient documentation

## 2017-03-25 HISTORY — DX: Anxiety disorder, unspecified: F41.9

## 2017-03-25 HISTORY — DX: Hypothyroidism, unspecified: E03.9

## 2017-03-25 LAB — CBC
HEMATOCRIT: 36.8 % (ref 36.0–46.0)
HEMOGLOBIN: 12.3 g/dL (ref 12.0–15.0)
MCH: 30.5 pg (ref 26.0–34.0)
MCHC: 33.4 g/dL (ref 30.0–36.0)
MCV: 91.3 fL (ref 78.0–100.0)
Platelets: 221 10*3/uL (ref 150–400)
RBC: 4.03 MIL/uL (ref 3.87–5.11)
RDW: 12.5 % (ref 11.5–15.5)
WBC: 5.3 10*3/uL (ref 4.0–10.5)

## 2017-03-25 LAB — BASIC METABOLIC PANEL
ANION GAP: 7 (ref 5–15)
BUN: 16 mg/dL (ref 6–20)
CO2: 26 mmol/L (ref 22–32)
Calcium: 9.1 mg/dL (ref 8.9–10.3)
Chloride: 105 mmol/L (ref 101–111)
Creatinine, Ser: 0.59 mg/dL (ref 0.44–1.00)
GFR calc Af Amer: >60 mL/min (ref >60)
GFR calc non Af Amer: >60 mL/min (ref >60)
GLUCOSE: 90 mg/dL (ref 65–99)
POTASSIUM: 4.3 mmol/L (ref 3.5–5.1)
Sodium: 138 mmol/L (ref 135–145)

## 2017-03-31 ENCOUNTER — Other Ambulatory Visit (HOSPITAL_COMMUNITY): Payer: BLUE CROSS/BLUE SHIELD

## 2017-04-05 ENCOUNTER — Ambulatory Visit
Admission: RE | Admit: 2017-04-05 | Discharge: 2017-04-05 | Disposition: A | Discharge status: Home / Self Care | Payer: BLUE CROSS/BLUE SHIELD | Source: Ambulatory Visit | Attending: General Surgery | Admitting: General Surgery

## 2017-04-05 ENCOUNTER — Inpatient Hospital Stay: Admission: RE | Admit: 2017-04-05 | Payer: BLUE CROSS/BLUE SHIELD | Source: Ambulatory Visit

## 2017-04-05 ENCOUNTER — Other Ambulatory Visit: Payer: Self-pay | Admitting: General Surgery

## 2017-04-05 DIAGNOSIS — R928 Other abnormal and inconclusive findings on diagnostic imaging of breast: Secondary | ICD-10-CM | POA: Diagnosis not present

## 2017-04-05 DIAGNOSIS — C50912 Malignant neoplasm of unspecified site of left female breast: Secondary | ICD-10-CM

## 2017-04-06 ENCOUNTER — Ambulatory Visit
Admission: RE | Admit: 2017-04-06 | Discharge: 2017-04-06 | Disposition: A | Discharge status: Home / Self Care | Payer: BLUE CROSS/BLUE SHIELD | Source: Ambulatory Visit | Attending: General Surgery | Admitting: General Surgery

## 2017-04-06 ENCOUNTER — Encounter (HOSPITAL_COMMUNITY)
Admission: RE | Disposition: A | Discharge status: Home / Self Care | Payer: Self-pay | Source: Ambulatory Visit | Attending: General Surgery

## 2017-04-06 ENCOUNTER — Encounter (HOSPITAL_COMMUNITY)
Admission: RE | Admit: 2017-04-06 | Discharge: 2017-04-06 | Disposition: A | Discharge status: Home / Self Care | Payer: BLUE CROSS/BLUE SHIELD | Source: Ambulatory Visit | Attending: General Surgery | Admitting: General Surgery

## 2017-04-06 ENCOUNTER — Encounter (HOSPITAL_COMMUNITY): Payer: Self-pay | Admitting: Surgery

## 2017-04-06 ENCOUNTER — Ambulatory Visit (HOSPITAL_COMMUNITY): Payer: BLUE CROSS/BLUE SHIELD | Admitting: Certified Registered"

## 2017-04-06 ENCOUNTER — Ambulatory Visit (HOSPITAL_COMMUNITY)
Admission: RE | Admit: 2017-04-06 | Discharge: 2017-04-06 | Disposition: A | Discharge status: Home / Self Care | Payer: BLUE CROSS/BLUE SHIELD | Source: Ambulatory Visit | Attending: General Surgery | Admitting: General Surgery

## 2017-04-06 DIAGNOSIS — C50912 Malignant neoplasm of unspecified site of left female breast: Secondary | ICD-10-CM | POA: Diagnosis not present

## 2017-04-06 DIAGNOSIS — C773 Secondary and unspecified malignant neoplasm of axilla and upper limb lymph nodes: Secondary | ICD-10-CM | POA: Diagnosis not present

## 2017-04-06 DIAGNOSIS — G8918 Other acute postprocedural pain: Secondary | ICD-10-CM | POA: Diagnosis not present

## 2017-04-06 DIAGNOSIS — E039 Hypothyroidism, unspecified: Secondary | ICD-10-CM | POA: Diagnosis not present

## 2017-04-06 DIAGNOSIS — R590 Localized enlarged lymph nodes: Secondary | ICD-10-CM | POA: Diagnosis not present

## 2017-04-06 DIAGNOSIS — Z170 Estrogen receptor positive status [ER+]: Secondary | ICD-10-CM | POA: Insufficient documentation

## 2017-04-06 DIAGNOSIS — Z79899 Other long term (current) drug therapy: Secondary | ICD-10-CM | POA: Diagnosis not present

## 2017-04-06 DIAGNOSIS — F419 Anxiety disorder, unspecified: Secondary | ICD-10-CM | POA: Insufficient documentation

## 2017-04-06 DIAGNOSIS — F418 Other specified anxiety disorders: Secondary | ICD-10-CM | POA: Diagnosis not present

## 2017-04-06 DIAGNOSIS — Z452 Encounter for adjustment and management of vascular access device: Secondary | ICD-10-CM | POA: Insufficient documentation

## 2017-04-06 DIAGNOSIS — C50412 Malignant neoplasm of upper-outer quadrant of left female breast: Secondary | ICD-10-CM | POA: Insufficient documentation

## 2017-04-06 DIAGNOSIS — Z885 Allergy status to narcotic agent status: Secondary | ICD-10-CM | POA: Diagnosis not present

## 2017-04-06 DIAGNOSIS — F329 Major depressive disorder, single episode, unspecified: Secondary | ICD-10-CM | POA: Insufficient documentation

## 2017-04-06 HISTORY — PX: BREAST LUMPECTOMY WITH RADIOACTIVE SEED AND SENTINEL LYMPH NODE BIOPSY: SHX6550

## 2017-04-06 HISTORY — PX: PORT-A-CATH REMOVAL: SHX5289

## 2017-04-06 SURGERY — BREAST LUMPECTOMY WITH RADIOACTIVE SEED AND SENTINEL LYMPH NODE BIOPSY
Anesthesia: Regional | Site: Chest

## 2017-04-06 MED ORDER — ONDANSETRON HCL 4 MG/2ML IJ SOLN
INTRAMUSCULAR | Status: DC | PRN
  Administered 2017-04-06: 4 mg via INTRAVENOUS

## 2017-04-06 MED ORDER — TECHNETIUM TC 99M SULFUR COLLOID FILTERED
1.0000 | Freq: Once | INTRAVENOUS | Status: AC | PRN
  Administered 2017-04-06: 1 via INTRADERMAL

## 2017-04-06 MED ORDER — MIDAZOLAM HCL 2 MG/2ML IJ SOLN
INTRAMUSCULAR | Status: AC
  Administered 2017-04-06: 1 mg via INTRAVENOUS
  Filled 2017-04-06: qty 2

## 2017-04-06 MED ORDER — ONDANSETRON HCL 4 MG/2ML IJ SOLN
INTRAMUSCULAR | Status: AC
  Filled 2017-04-06: qty 2

## 2017-04-06 MED ORDER — LIDOCAINE 2% (20 MG/ML) 5 ML SYRINGE
INTRAMUSCULAR | Status: AC
  Filled 2017-04-06: qty 5

## 2017-04-06 MED ORDER — CEFAZOLIN SODIUM-DEXTROSE 2-4 GM/100ML-% IV SOLN
2.0000 g | INTRAVENOUS | Status: AC
  Administered 2017-04-06: 2 g via INTRAVENOUS
  Filled 2017-04-06: qty 100

## 2017-04-06 MED ORDER — ACETAMINOPHEN 500 MG PO TABS
1000.0000 mg | ORAL_TABLET | ORAL | Status: AC
  Administered 2017-04-06: 1000 mg via ORAL
  Filled 2017-04-06: qty 2

## 2017-04-06 MED ORDER — LACTATED RINGERS IV SOLN: INTRAVENOUS | Status: DC | PRN

## 2017-04-06 MED ORDER — GABAPENTIN 300 MG PO CAPS
300.0000 mg | ORAL_CAPSULE | ORAL | Status: AC
  Administered 2017-04-06: 300 mg via ORAL
  Filled 2017-04-06: qty 1

## 2017-04-06 MED ORDER — CHLORHEXIDINE GLUCONATE CLOTH 2 % EX PADS: 6.0000 | MEDICATED_PAD | Freq: Once | CUTANEOUS | Status: DC

## 2017-04-06 MED ORDER — FENTANYL CITRATE (PF) 100 MCG/2ML IJ SOLN
50.0000 ug | Freq: Once | INTRAMUSCULAR | Status: AC
  Administered 2017-04-06: 50 ug via INTRAVENOUS

## 2017-04-06 MED ORDER — SODIUM CHLORIDE 0.9 % IJ SOLN
INTRAMUSCULAR | Status: AC
  Filled 2017-04-06: qty 10

## 2017-04-06 MED ORDER — LIDOCAINE 2% (20 MG/ML) 5 ML SYRINGE
INTRAMUSCULAR | Status: DC | PRN
  Administered 2017-04-06: 50 mg via INTRAVENOUS

## 2017-04-06 MED ORDER — MIDAZOLAM HCL 2 MG/2ML IJ SOLN
1.0000 mg | Freq: Once | INTRAMUSCULAR | Status: AC
  Administered 2017-04-06: 1 mg via INTRAVENOUS

## 2017-04-06 MED ORDER — OXYCODONE HCL 5 MG PO TABS
ORAL_TABLET | ORAL | Status: AC
  Administered 2017-04-06: 5 mg via ORAL
  Filled 2017-04-06: qty 1

## 2017-04-06 MED ORDER — PROPOFOL 1000 MG/100ML IV EMUL
INTRAVENOUS | Status: AC
  Filled 2017-04-06: qty 100

## 2017-04-06 MED ORDER — PROPOFOL 10 MG/ML IV BOLUS
INTRAVENOUS | Status: DC | PRN
  Administered 2017-04-06: 130 mg via INTRAVENOUS

## 2017-04-06 MED ORDER — FENTANYL CITRATE (PF) 100 MCG/2ML IJ SOLN
INTRAMUSCULAR | Status: AC
Start: 2017-04-06 — End: 2017-04-06
  Administered 2017-04-06: 50 ug via INTRAVENOUS
  Filled 2017-04-06: qty 2

## 2017-04-06 MED ORDER — DEXAMETHASONE SODIUM PHOSPHATE 4 MG/ML IJ SOLN
INTRAMUSCULAR | Status: DC | PRN
  Administered 2017-04-06: 8 mg via INTRAVENOUS

## 2017-04-06 MED ORDER — BUPIVACAINE-EPINEPHRINE 0.25% -1:200000 IJ SOLN
INTRAMUSCULAR | Status: DC | PRN
  Administered 2017-04-06: 10 mL

## 2017-04-06 MED ORDER — PHENYLEPHRINE HCL 10 MG/ML IJ SOLN
INTRAMUSCULAR | Status: DC | PRN
  Administered 2017-04-06 (×2): 40 ug via INTRAVENOUS
  Administered 2017-04-06: 80 ug via INTRAVENOUS

## 2017-04-06 MED ORDER — 0.9 % SODIUM CHLORIDE (POUR BTL) OPTIME
TOPICAL | Status: DC | PRN
  Administered 2017-04-06: 1000 mL

## 2017-04-06 MED ORDER — FENTANYL CITRATE (PF) 250 MCG/5ML IJ SOLN
INTRAMUSCULAR | Status: AC
  Filled 2017-04-06: qty 5

## 2017-04-06 MED ORDER — ACETAMINOPHEN 160 MG/5ML PO SOLN: 325.0000 mg | ORAL | Status: DC | PRN

## 2017-04-06 MED ORDER — CELECOXIB 200 MG PO CAPS
400.0000 mg | ORAL_CAPSULE | ORAL | Status: AC
  Administered 2017-04-06: 400 mg via ORAL
  Filled 2017-04-06: qty 2

## 2017-04-06 MED ORDER — MIDAZOLAM HCL 2 MG/2ML IJ SOLN
1.0000 mg | Freq: Once | INTRAMUSCULAR | Status: AC
Start: 2017-04-06 — End: 2017-04-06
  Administered 2017-04-06: 1 mg via INTRAVENOUS

## 2017-04-06 MED ORDER — OXYCODONE HCL 5 MG PO TABS
5.0000 mg | ORAL_TABLET | Freq: Once | ORAL | Status: AC | PRN
  Administered 2017-04-06: 5 mg via ORAL

## 2017-04-06 MED ORDER — FENTANYL CITRATE (PF) 100 MCG/2ML IJ SOLN
25.0000 ug | INTRAMUSCULAR | Status: DC | PRN
  Administered 2017-04-06: 50 ug via INTRAVENOUS

## 2017-04-06 MED ORDER — HYDROCODONE-ACETAMINOPHEN 5-325 MG PO TABS: 1.0000 | ORAL_TABLET | ORAL | 0 refills | Status: DC | PRN

## 2017-04-06 MED ORDER — DEXAMETHASONE SODIUM PHOSPHATE 10 MG/ML IJ SOLN
INTRAMUSCULAR | Status: AC
  Filled 2017-04-06: qty 1

## 2017-04-06 MED ORDER — FENTANYL CITRATE (PF) 100 MCG/2ML IJ SOLN
INTRAMUSCULAR | Status: AC
  Administered 2017-04-06: 50 ug via INTRAVENOUS
  Filled 2017-04-06: qty 2

## 2017-04-06 MED ORDER — ROPIVACAINE HCL 7.5 MG/ML IJ SOLN
INTRAMUSCULAR | Status: DC | PRN
  Administered 2017-04-06: 20 mL via PERINEURAL

## 2017-04-06 MED ORDER — OXYCODONE HCL 5 MG/5ML PO SOLN: 5.0000 mg | Freq: Once | ORAL | Status: AC | PRN

## 2017-04-06 MED ORDER — EPHEDRINE 5 MG/ML INJ
INTRAVENOUS | Status: AC
  Filled 2017-04-06: qty 10

## 2017-04-06 MED ORDER — MIDAZOLAM HCL 2 MG/2ML IJ SOLN
INTRAMUSCULAR | Status: AC
  Filled 2017-04-06: qty 2

## 2017-04-06 MED ORDER — BUPIVACAINE-EPINEPHRINE (PF) 0.25% -1:200000 IJ SOLN
INTRAMUSCULAR | Status: AC
  Filled 2017-04-06: qty 30

## 2017-04-06 MED ORDER — METHYLENE BLUE 0.5 % INJ SOLN
INTRAVENOUS | Status: AC
  Filled 2017-04-06: qty 10

## 2017-04-06 MED ORDER — LIDOCAINE HCL (PF) 1 % IJ SOLN
INTRAMUSCULAR | Status: AC
  Filled 2017-04-06: qty 30

## 2017-04-06 MED ORDER — LACTATED RINGERS IV SOLN
INTRAVENOUS | Status: DC | PRN
  Administered 2017-04-06 (×2): via INTRAVENOUS

## 2017-04-06 MED ORDER — ACETAMINOPHEN 325 MG PO TABS: 325.0000 mg | ORAL_TABLET | ORAL | Status: DC | PRN

## 2017-04-06 MED ORDER — PHENYLEPHRINE 40 MCG/ML (10ML) SYRINGE FOR IV PUSH (FOR BLOOD PRESSURE SUPPORT)
PREFILLED_SYRINGE | INTRAVENOUS | Status: AC
  Filled 2017-04-06: qty 10

## 2017-04-06 SURGICAL SUPPLY — 63 items
ADH SKN CLS APL DERMABOND .7 (GAUZE/BANDAGES/DRESSINGS) ×4
APPLIER CLIP 9.375 MED OPEN (MISCELLANEOUS)
APR CLP MED 9.3 20 MLT OPN (MISCELLANEOUS)
BINDER BREAST LRG (GAUZE/BANDAGES/DRESSINGS) ×3 IMPLANT
BINDER BREAST XLRG (GAUZE/BANDAGES/DRESSINGS) IMPLANT
BLADE SURG 15 STRL LF DISP TIS (BLADE) ×2 IMPLANT
BLADE SURG 15 STRL SS (BLADE) ×3
CANISTER SUCT 3000ML PPV (MISCELLANEOUS) ×3 IMPLANT
CHLORAPREP W/TINT 10.5 ML (MISCELLANEOUS) ×1 IMPLANT
CHLORAPREP W/TINT 26ML (MISCELLANEOUS) ×3 IMPLANT
CLIP APPLIE 9.375 MED OPEN (MISCELLANEOUS) IMPLANT
CLIP VESOCCLUDE MED 6/CT (CLIP) ×4 IMPLANT
CONT SPEC 4OZ CLIKSEAL STRL BL (MISCELLANEOUS) ×10 IMPLANT
COVER PROBE W GEL 5X96 (DRAPES) ×3 IMPLANT
COVER SURGICAL LIGHT HANDLE (MISCELLANEOUS) ×3 IMPLANT
DERMABOND ADVANCED (GAUZE/BANDAGES/DRESSINGS) ×2
DERMABOND ADVANCED .7 DNX12 (GAUZE/BANDAGES/DRESSINGS) ×2 IMPLANT
DEVICE DUBIN SPECIMEN MAMMOGRA (MISCELLANEOUS) ×4 IMPLANT
DRAPE CHEST BREAST 15X10 FENES (DRAPES) ×3 IMPLANT
DRAPE LAPAROTOMY 100X72 PEDS (DRAPES) ×1 IMPLANT
DRAPE SURG 17X23 STRL (DRAPES) ×12 IMPLANT
DRAPE UTILITY XL STRL (DRAPES) ×3 IMPLANT
ELECT CAUTERY BLADE 6.4 (BLADE) ×3 IMPLANT
ELECT COATED BLADE 2.86 ST (ELECTRODE) ×3 IMPLANT
ELECT REM PT RETURN 9FT ADLT (ELECTROSURGICAL) ×3
ELECTRODE REM PT RTRN 9FT ADLT (ELECTROSURGICAL) ×2 IMPLANT
GAUZE SPONGE 4X4 12PLY STRL LF (GAUZE/BANDAGES/DRESSINGS) ×1 IMPLANT
GAUZE SPONGE 4X4 16PLY XRAY LF (GAUZE/BANDAGES/DRESSINGS) ×3 IMPLANT
GLOVE BIOGEL PI IND STRL 8 (GLOVE) ×2 IMPLANT
GLOVE BIOGEL PI INDICATOR 8 (GLOVE) ×1
GLOVE ECLIPSE 7.5 STRL STRAW (GLOVE) ×6 IMPLANT
GOWN STRL REUS W/ TWL LRG LVL3 (GOWN DISPOSABLE) ×2 IMPLANT
GOWN STRL REUS W/ TWL XL LVL3 (GOWN DISPOSABLE) ×2 IMPLANT
GOWN STRL REUS W/TWL LRG LVL3 (GOWN DISPOSABLE)
GOWN STRL REUS W/TWL XL LVL3 (GOWN DISPOSABLE) ×3
ILLUMINATOR WAVEGUIDE N/F (MISCELLANEOUS) IMPLANT
KIT BASIN OR (CUSTOM PROCEDURE TRAY) ×3 IMPLANT
KIT MARKER MARGIN INK (KITS) ×3 IMPLANT
KIT ROOM TURNOVER OR (KITS) ×3 IMPLANT
NDL FILTER BLUNT 18X1 1/2 (NEEDLE) IMPLANT
NDL HYPO 25GX1X1/2 BEV (NEEDLE) ×2 IMPLANT
NDL SAFETY ECLIPSE 18X1.5 (NEEDLE) IMPLANT
NEEDLE FILTER BLUNT 18X 1/2SAF (NEEDLE)
NEEDLE FILTER BLUNT 18X1 1/2 (NEEDLE) IMPLANT
NEEDLE HYPO 18GX1.5 SHARP (NEEDLE)
NEEDLE HYPO 25GX1X1/2 BEV (NEEDLE) ×3 IMPLANT
NS IRRIG 1000ML POUR BTL (IV SOLUTION) ×3 IMPLANT
PACK SURGICAL SETUP 50X90 (CUSTOM PROCEDURE TRAY) ×3 IMPLANT
PAD ABD 8X10 STRL (GAUZE/BANDAGES/DRESSINGS) ×1 IMPLANT
PAD ARMBOARD 7.5X6 YLW CONV (MISCELLANEOUS) ×3 IMPLANT
PENCIL BUTTON HOLSTER BLD 10FT (ELECTRODE) ×3 IMPLANT
SPONGE LAP 18X18 X RAY DECT (DISPOSABLE) ×3 IMPLANT
SUT MON AB 4-0 PC3 18 (SUTURE) ×3 IMPLANT
SUT MON AB 5-0 PS2 18 (SUTURE) ×6 IMPLANT
SUT VIC AB 3-0 SH 18 (SUTURE) ×5 IMPLANT
SUT VIC AB 3-0 SH 27 (SUTURE) ×3
SUT VIC AB 3-0 SH 27XBRD (SUTURE) ×2 IMPLANT
SYR BULB 3OZ (MISCELLANEOUS) ×3 IMPLANT
SYR CONTROL 10ML LL (SYRINGE) ×3 IMPLANT
TOWEL OR 17X24 6PK STRL BLUE (TOWEL DISPOSABLE) ×3 IMPLANT
TOWEL OR 17X26 10 PK STRL BLUE (TOWEL DISPOSABLE) ×2 IMPLANT
TUBE CONNECTING 12X1/4 (SUCTIONS) ×3 IMPLANT
YANKAUER SUCT BULB TIP NO VENT (SUCTIONS) ×3 IMPLANT

## 2017-04-06 NOTE — Anesthesia Preprocedure Evaluation (Signed)
Anesthesia Evaluation  Patient identified by MRN, date of birth, ID band Patient awake    Reviewed: Allergy & Precautions, NPO status , Patient's Chart, lab work & pertinent test results  History of Anesthesia Complications Negative for: history of anesthetic complications  Airway Mallampati: II  TM Distance: >3 FB Neck ROM: Full    Dental  (+) Partial Upper, Teeth Intact   Pulmonary neg pulmonary ROS,    breath sounds clear to auscultation       Cardiovascular negative cardio ROS   Rhythm:Regular     Neuro/Psych PSYCHIATRIC DISORDERS Anxiety Depression negative neurological ROS     GI/Hepatic negative GI ROS, Neg liver ROS,   Endo/Other  Hypothyroidism   Renal/GU negative Renal ROS     Musculoskeletal negative musculoskeletal ROS (+)   Abdominal   Peds  Hematology negative hematology ROS (+)   Anesthesia Other Findings   Reproductive/Obstetrics                             Anesthesia Physical Anesthesia Plan  ASA: II  Anesthesia Plan: General and Regional   Post-op Pain Management:    Induction: Intravenous  PONV Risk Score and Plan: 3 and Ondansetron and Dexamethasone  Airway Management Planned: LMA and Oral ETT  Additional Equipment: None  Intra-op Plan:   Post-operative Plan: Extubation in OR  Informed Consent: I have reviewed the patients History and Physical, chart, labs and discussed the procedure including the risks, benefits and alternatives for the proposed anesthesia with the patient or authorized representative who has indicated his/her understanding and acceptance.   Dental advisory given  Plan Discussed with: CRNA and Surgeon  Anesthesia Plan Comments:         Anesthesia Quick Evaluation

## 2017-04-06 NOTE — H&P (Signed)
History of Present Illness  The patient is a 61 year old female who presents with breast cancer. Patient returns following completion of neoadjuvant chemotherapy for stage II cancer of the left breast. Her original presentation was as follows:  She is a postmenopausal female referred by Dr. Dorise Bullion for evaluation of recently diagnosed carcinoma of the left breast. The patient was recently able to feel a mass in her upper left breast. She presented to her primary physician for evaluation and was referred to the breast Center for workup. Subsequent imaging included diagnostic mamogram showing an irregular mass in the upper-outer quadrant of the left breast associated with microcalcifications corresponding to the palpable lump and an additional mass in the lower left axilla likely lymph node and ultrasound showing 2 adjacent masses in the left breast at the 1 o'clock position 3 cm from the nipple measuring 2.1 and 2.2 cm respectively and overall measuring 5.1 cm in extent as well as a 1.1 cm mass in the low left axilla corresponding to the likely enlarged lymph node.. 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 She is seen now in the office for initial treatment planning. She has experienced a mass but no other symptoms such as skin changes or pain or nipple discharge. She does not have a personal history of any previous breast problems.  Findings at that time were the following: Tumor size: 2.1 and 2.2 cm adjacent with total 5.1 cm extent of disease  Tumor grade: 2 Estrogen Receptor: 100% positive Progesterone Receptor: Negative Her-2 neu: Negative Lymph node status: Positive  She has successfully completed her entire course of neoadjuvant chemotherapy. She is feeling reasonably  well at this point. She has noticed significant reduction in the palpable mass in her upper left breast.  MRI was just completed. These showed the 2 adjacent masses in the upper outer left breast with significant reduction in size with the larger measuring 1.1 cm and just anterior and superior to this is a 0.7 cm mass. No axillary adenopathy seen on MRI.    Problem List/Past Medical  STAGE II BREAST CANCER, LEFT (C50.912)   Past Surgical History  Breast Biopsy  Left. Thyroid Surgery   Diagnostic Studies History  Colonoscopy  never Mammogram  >3 years ago Pap Smear  1-5 years ago  Allergies  Morphine Derivatives  Rash.  Medication History  Omega 3 (1000MG Capsule, Oral) Active. Potassium Chloride (20MEQ Packet, Oral) Active. Zofran (8MG Tablet, Oral daily) Active. Prochlorperazine Maleate (10MG Tablet, Oral) Active. Armour Thyroid (15MG Tablet, Oral) Active. PARoxetine HCl (20MG Tablet, Oral) Active. Levothyroxine Sodium (75MCG Tablet, Oral) Active. Medications Reconciled  Social History Alcohol use  Moderate alcohol use. Caffeine use  Coffee. Tobacco use  Never smoker.  Family History Alcohol Abuse  Father, Sister. Breast Cancer  Mother. Depression  Father, Mother, Sister. Heart Disease  Family Members In General. Thyroid problems  Sister.  Pregnancy / Birth History Age at menarche  77 years. Age of menopause  75-58 Contraceptive History  Oral contraceptives. Gravida  1 Maternal age  30-40 Para  0  Other Problems Anxiety Disorder  Breast Cancer  Depression  Thyroid Disease   Vitals Weight: 125 lb Height: 63in Body Surface Area: 1.58 m Body Mass Index: 22.14 kg/m  Temp.: 51F  Pulse: 97 (Regular)  BP: 125/72 (  Sitting, Left Arm, Standard)       Physical Exam  The physical exam findings are as follows: Note:General: Alert, thin Caucasian female, in no distress Skin: Warm and dry without rash or  infection. HEENT: Alopecia. No palpable masses or thyromegaly. Sclera nonicteric. Pupils equal round and reactive. Lymph nodes: No cervical, supraclavicular, nodes palpable. Breasts: Small a cup bilaterally. There is a somewhat subtle mass palpable in the upper outer left breast much reduced from presentation. I cannot feel any axillary adenopathy. Lungs: Breath sounds clear and equal. No wheezing or increased work of breathing. Cardiovascular: Regular rate and rhythm without murmer. No JVD or edema. Peripheral pulses intact. No carotid bruits. Extremities: No edema or joint swelling or deformity. No chronic venous stasis changes. Neurologic: Alert and fully oriented. Gait normal. No focal weakness. Psychiatric: Normal mood and affect. Thought content appropriate with normal judgement and insight    Assessment & Plan  STAGE II BREAST CANCER, LEFT (C50.912) Impression: 61 year old female with a diagnosis of cancer of the left breast, upper outer quadrant. Clinical stage 2A were to be if her tumor is classified as a single 5 cm mass, ER positive, PR negative, HER-2 negative. She has had a very good response to neoadjuvant chemotherapy with resolution of her abnormal left axillary lymph node and significant shrinkage of the 2 adjacent masses in the upper outer quadrant of the left breast. She strongly desires the least amount of surgery possible. I think breast conservation with lumpectomy encompassing the 2 masses is reasonable although we discussed there could be some shrinkage or deformity due to her small breast size. We discussed management of her axilla. I would recommend a targeted left axillary sentinel lymph node biopsy with a seed placed in her known positive node and removal of this as well as sentinel lymph node biopsy. We discussed management of her axilla should she have any positive nodes. She would want to avoid axillary dissection if at all possible. I will therefore not plan any  immediate touch prep or frozen section diagnosis in the operating room. However we discussed that if there are grossly abnormal nodes we might elect to proceed with axillary dissection although this seems very unlikely based on her MRI and physical exam. We will also remove her Port-A-Cath. We will plan surgery in 3-5 weeks. We discussed the nature of surgery and recovery, risks of anesthetic complications, bleeding, infection, possible need for further surgery based on margins and other pathology. All her questions were answered.

## 2017-04-06 NOTE — Discharge Instructions (Signed)
[facility] °Office Phone Number [phone] ° °BREAST BIOPSY/ PARTIAL MASTECTOMY: POST OP INSTRUCTIONS ° °Always review your discharge instruction sheet given to you by the facility where your surgery was performed. ° °IF YOU HAVE DISABILITY OR FAMILY LEAVE FORMS, YOU MUST BRING THEM TO THE OFFICE FOR PROCESSING.  DO NOT GIVE THEM TO YOUR DOCTOR. ° °1. A prescription for pain medication may be given to you upon discharge.  Take your pain medication as prescribed, if needed.  If narcotic pain medicine is not needed, then you may take acetaminophen (Tylenol) or ibuprofen (Advil) as needed. °2. Take your usually prescribed medications unless otherwise directed °3. If you need a refill on your pain medication, please contact your pharmacy.  They will contact our office to request authorization.  Prescriptions will not be filled after 5pm or on week-ends. °4. You should eat very light the first 24 hours after surgery, such as soup, crackers, pudding, etc.  Resume your normal diet the day after surgery. °5. Most patients will experience some swelling and bruising in the breast.  Ice packs and a good support bra will help.  Swelling and bruising can take several days to resolve.  °6. It is common to experience some constipation if taking pain medication after surgery.  Increasing fluid intake and taking a stool softener will usually help or prevent this problem from occurring.  A mild laxative (Milk of Magnesia or Miralax) should be taken according to package directions if there are no bowel movements after 48 hours. °7. Unless discharge instructions indicate otherwise, you may remove your bandages 24-48 hours after surgery, and you may shower at that time.  You may have steri-strips (small skin tapes) in place directly over the incision.  These strips should be left on the skin for 7-10 days.  If your surgeon used skin glue on the incision, you may shower in 24 hours.  The glue will flake off over the  next 2-3 weeks.  Any sutures or staples will be removed at the office during your follow-up visit. °8. ACTIVITIES:  You may resume regular daily activities (gradually increasing) beginning the next day.  Wearing a good support bra or sports bra minimizes pain and swelling.  You may have sexual intercourse when it is comfortable. °a. You may drive when you no longer are taking prescription pain medication, you can comfortably wear a seatbelt, and you can safely maneuver your car and apply brakes. °b. RETURN TO WORK:  ______________________________________________________________________________________ °9. You should see your doctor in the office for a follow-up appointment approximately two weeks after your surgery.  Your doctor’s nurse will typically make your follow-up appointment when she calls you with your pathology report.  Expect your pathology report 2-3 business days after your surgery.  You may call to check if you do not hear from us after three days. °10. OTHER INSTRUCTIONS: _______________________________________________________________________________________________ _____________________________________________________________________________________________________________________________________ °_____________________________________________________________________________________________________________________________________ °_____________________________________________________________________________________________________________________________________ ° °WHEN TO CALL YOUR DOCTOR: °1. Fever over 101.0 °2. Nausea and/or vomiting. °3. Extreme swelling or bruising. °4. Continued bleeding from incision. °5. Increased pain, redness, or drainage from the incision. ° °The clinic staff is available to answer your questions during regular business hours.  Please don’t hesitate to call and ask to speak to one of the nurses for clinical concerns.  If you have a medical emergency, go to the nearest  emergency room or call 911.  A surgeon from [facility] is always on call at the hospital. ° °For further questions, please visit [email or web address]  °

## 2017-04-06 NOTE — Interval H&P Note (Signed)
History and Physical Interval Note:  04/06/2017 10:37 AM  Angela [name]  has presented today for surgery, with the diagnosis of LEFT BREAST CANCER  The various methods of treatment have been discussed with the patient and family. After consideration of risks, benefits and other options for treatment, the patient has consented to  Procedure(s): LEFT BREAST RADIOACTIVE SEED LOCALIZED LUMPECTOMY WITH RADIOACTIVE SEED LOCALIZED TARGETED LEFT AXILLARY SENTINEL LYMPH NODE BIOPSY (Left) REMOVAL PORT-A-CATH (N/A) as a surgical intervention .  The patient's history has been reviewed, patient examined, no change in status, stable for surgery.  I have reviewed the patient's chart and labs.  Questions were answered to the patient's satisfaction.     Aalyssa Elderkin T

## 2017-04-06 NOTE — Transfer of Care (Signed)
Immediate Anesthesia Transfer of Care Note  Patient: Angela [name]  Procedure(s) Performed: LEFT BREAST RADIOACTIVE SEED LOCALIZED LUMPECTOMY WITH RADIOACTIVE SEED LOCALIZED TARGETED LEFT AXILLARY SENTINEL LYMPH NODE BIOPSY (Left Breast) REMOVAL PORT-A-CATH (N/A Chest)  Patient Location: PACU  Anesthesia Type:General and Regional  Level of Consciousness: unresponsive and drowsy  Airway & Oxygen Therapy: Patient Spontanous Breathing and Patient connected to face mask oxygen  Post-op Assessment: Report given to RN and Post -op Vital signs reviewed and stable  Post vital signs: Reviewed and stable  Last Vitals:  Vitals:   04/06/17 1025 04/06/17 1246  BP: 110/64 95/63  Pulse: 74 95  Resp: 12 (!) 9  Temp:  (!) 36.3 C  SpO2: 99% 99%    Last Pain:  Vitals:   04/06/17 0857  PainSc: 0-No pain      Patients Stated Pain Goal: 2 (67/20/94 [id number])  Complications: No apparent anesthesia complications

## 2017-04-06 NOTE — Anesthesia Postprocedure Evaluation (Signed)
Anesthesia Post Note  Patient: Angela [name]  Procedure(s) Performed: LEFT BREAST RADIOACTIVE SEED LOCALIZED LUMPECTOMY WITH RADIOACTIVE SEED LOCALIZED TARGETED LEFT AXILLARY SENTINEL LYMPH NODE BIOPSY (Left Breast) REMOVAL PORT-A-CATH (N/A Chest)     Patient location during evaluation: PACU Anesthesia Type: Regional and General Level of consciousness: awake and alert Pain management: pain level controlled Vital Signs Assessment: post-procedure vital signs reviewed and stable Respiratory status: spontaneous breathing, nonlabored ventilation, respiratory function stable and patient connected to nasal cannula oxygen Cardiovascular status: blood pressure returned to baseline and stable Postop Assessment: no apparent nausea or vomiting Anesthetic complications: no    Last Vitals:  Vitals:   04/06/17 1430 04/06/17 1445  BP: 92/64   Pulse: 79   Resp: (!) 21   Temp:  (!) 36.2 C  SpO2: 93%     Last Pain:  Vitals:   04/06/17 1445  PainSc: 2                  Effie Berkshire

## 2017-04-06 NOTE — Anesthesia Procedure Notes (Signed)
Procedure Name: LMA Insertion Date/Time: 04/06/2017 10:55 AM Performed by: Orlie Dakin Pre-anesthesia Checklist: Patient identified, Emergency Drugs available, Suction available, Timeout performed and Patient being monitored Patient Re-evaluated:Patient Re-evaluated prior to induction Oxygen Delivery Method: Circle system utilized Preoxygenation: Pre-oxygenation with 100% oxygen Induction Type: IV induction Ventilation: Mask ventilation without difficulty LMA: LMA inserted LMA Size: 4.0 Tube type: Oral Number of attempts: 1 Placement Confirmation: positive ETCO2 and breath sounds checked- equal and bilateral Tube secured with: Tape Dental Injury: Teeth and Oropharynx as per pre-operative assessment

## 2017-04-06 NOTE — Anesthesia Procedure Notes (Signed)
Anesthesia Regional Block: Pectoralis block   Pre-Anesthetic Checklist: ,, timeout performed, Correct Patient, Correct Site, Correct Laterality, Correct Procedure, Correct Position, site marked, Risks and benefits discussed,  Surgical consent,  Pre-op evaluation,  At surgeon's request and post-op pain management  Laterality: Left  Prep: chloraprep       Needles:  Injection technique: Single-shot  Needle Type: Echogenic Stimulator Needle          Additional Needles:   Procedures:,,,, ultrasound used (permanent image in chart),,,,  Narrative:  Start time: 04/06/2017 10:33 AM End time: 04/06/2017 10:39 AM Injection made incrementally with aspirations every 5 mL.  Performed by: Personally  Anesthesiologist: Chestine Belknap  Additional Notes: H+P and labs reviewed, risks and benefits discussed with patient, procedure tolerated well without complications

## 2017-04-06 NOTE — Op Note (Signed)
Preoperative Diagnosis: LEFT BREAST CANCER  Postoprative Diagnosis: LEFT BREAST CANCER  Procedure: Procedure(s): LEFT BREAST RADIOACTIVE SEED LOCALIZED LUMPECTOMY WITH RADIOACTIVE SEED LOCALIZED TARGETED DEEP LEFT AXILLARY SENTINEL LYMPH NODE BIOPSY REMOVAL PORT-A-CATH   Surgeon: Excell Seltzer T   Assistants: None  Anesthesia:  General LMA anesthesia  Indications: Patient is a 61 year old female with a diagnosis of clinical stage II invasive ductal carcinoma ER positive, PR negative, HER-2 negative with 2 adjacent masses in the upper outer left breast encompassing a total of 5 cm and 1 apparent low left axillary lymph node or positive on preoperative biopsy. She has undergone neoadjuvant chemotherapy with excellent clinical response and resolution of her palpable abnormalities with MRI showing a residual approximately 1 cm tumor in the breast and no evidence of adenopathy. After extensive preoperative discussion and counseling regarding surgical treatment options the patient strongly desires minimal surgery and we therefore plan to proceed with radioactive seed localized left breast lumpectomy and radioactive seed localized targeted left axillary sentinel lymph node biopsy. The procedure and risks including possible need for further surgery have been discussed and understood detailed elsewhere.    Procedure Detail:  Patient a previously undergone accurate placement of a radioactive seed at the tumor and ribbon-shaped clip site in the upper outer left breast and in the low left axilla at the [facility] are markedly clip site. In the holding area she underwent a pectoral block by anesthesia and underwent injection of 1 mCi of technetium sulfur colloid intradermally around the left nipple.  She was taken to the operating room and placed in the supine position on the operating table and laryngeal mask general anesthesia induced. She was carefully positioned with her left arm extended and the entire  anterior chest, left breast axilla and upper arm were widely sterilely prepped and draped. She received preoperative IV antibiotics. The PAS were in place. Patient timeout was performed and correct procedure verified. The Port-A-Cath on the right chest was initially removed excising the previous scar and sharply dissecting the port out of the surrounding subcutaneous tissue and the port and catheter were withdrawn intact. Hemostasis was obtained and the subcutaneous closed with interrupted 3-0 Vicryl and the skin closed with running subcuticular 5-0 Monocryl and Dermabond. Attention was then turned to the left lumpectomy. The seed was localized in the expected position in the upper outer left breast. I made a curvilinear incision directly over the seed as it was very close to the skin and the breast tissue was very thin in this site. Thin skin and subcutaneous flaps were raised for a short distance in all directions. Using the neoprobe for guidance I excised a generous specimen of breast tissue, approximately 4-1/2 cm around the area of high counts. This was taken straight down to the chest wall and the pectoral fascia was excised with the specimen. There was a small palpable mass contained within the dense rest tissue. The specimen was inked for margins and specimen x-ray showed the seed and marking clip located centrally within the specimen all though a little closer to the medial margin. I excised an additional 4-5 mm of medial margin excising the entire medial margin which was inked and sent as a separate specimen.  Attention was then turned to the radioactive seed targeted lymph node removal. The neoprobe indicated a very low axillary area which was accessible through the lumpectomy incision. I dissected down just below the edge of the pectoralis major and entered what appeared to be the true axilla where the neoprobe  localized to a normal sized but slightly firm apparent lymph node surrounded by axillary  and not breast tissue. This was completely excised with cautery with a rim of normal surrounding appearing tissue. X-ray showed the clip and seed contained within the specimen. The neoprobe set to technician then indicated a hot area in the axilla. This was definitely higher in the axilla and I used a separate axillary incision and a skin crease of the axilla could be thoroughly explored. Dissection was carried down through the subcutaneous tissue and clavipectoral fascia. I initially dissected down onto a slightly firm normal-sized lymph node with very high counts in excess of 2000 and was excised. Following this I performed a very thorough exploration of the axilla with inspection and palpation and with the neoprobe. A total of 7 additional axillary sentinel lymph nodes were ultimately removed. One of these felt slightly enlarged but soft and the remainder were normal size and consistency. Counts ranged from 50-200 with the neoprobe. At the conclusion I had thoroughly explored the axilla and had exposed and protected the axillary vein and had also identified the long thoracic and thoracodorsal nerve. At the conclusion of the exploration there was no palpable adenopathy. Following this the lumpectomy cavity was marked with clips. The deep breast and deep axillary and subcutaneous tissue were closed with interrupted 3-0 Vicryl. The skin incisions were closed with running subcuticular 5-0 Monocryl and Dermabond. Sponge needle and instrument counts were correct.    Findings: As above  Estimated Blood Loss:  Minimal         Drains: None  Blood Given: none          Specimens: #1 left breast lumpectomy #2 additional medial margin #3 radioactive seed targeted left axillary lymph node #4-10 left axillary sentinel lymph nodes X 7        Complications:  * No complications entered in OR log *         Disposition: PACU - hemodynamically stable.         Condition: stable

## 2017-04-07 ENCOUNTER — Encounter (HOSPITAL_COMMUNITY): Payer: Self-pay | Admitting: General Surgery

## 2017-04-19 NOTE — Progress Notes (Signed)
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 etc.), slightly in increased risk of cardiovascular disease, cataracts, muscular and joint discomfort, osteopenia and osteoporosis, etc, were discussed with her in great details. She is interested, and agrees to start now.  -her initial biopsy was negative for HER2, post-neoadjuvant tumor was equivalent for HER2, by IHC and fish. I spoke with pathologist Dr. Melina Copa and she will see if additional HER2 test will be available  -I called Dr. Excell Seltzer, case will be presented in tumor board tomorrow  -All CBC and CMP are with normal limits today, reviewed with her  -f/u in 6 weeks  2. Depression, ? bipolar -Patient's sister reports she may have underlying bipolar, which was never diagnosed. Both patient and her sister reports worsening depression lately since she started chemotherapy.  -I had a long conversation with patient and her sister. I previously encouraged her to be positive, and seek support from her sister and friend, and see a psychiatrist. She agreed. - I previously referred her to social  worker and counseling was done. She was previously referred to  psychiatrist. She is worried about not being able to afford the cost and has not scheduled appointment yet  -The patient denied suicidal ideas. - She is feeling much better overall now, she will hold on psychiatry referral for now.  3. Hypothyroidism -Continue medication   Plan: -Labs reviewed, stable overall.  -she agrees to call Dr. Excell Seltzer back to discuss re-excision  -start AI next week -I will send prescription of Xeloda out, to see if she has high co-pay  -will see her back in 6 week (after she second surgery) -I discussed with Dr. Excell Seltzer  -tumor board discuss tomorrow  -I requested additional HER2 test on her surgical tumor sample     No orders of the defined types were placed in this encounter.   All questions were answered. The patient knows to call the clinic with any problems, questions or concerns.  I spent 30 minutes counseling the patient face to face. The total time spent in the appointment was 45 minutes and more than 50% was on counseling.  This document serves as a record of services personally performed by Truitt Merle, MD. It was created on her behalf by Reola Mosher, a trained medical scribe. The creation of this record is based on the scribe's personal observations and the provider's statements to them. This document has been checked and approved by the attending provider.  Truitt Merle  04/20/17   Addendum Case presented in breast tumor board today, consensus is second surgery with re-excision or mastectomy, treatment plan remains to be the same as I recommended  Truitt Merle  04/21/2017

## 2017-04-20 ENCOUNTER — Other Ambulatory Visit (HOSPITAL_BASED_OUTPATIENT_CLINIC_OR_DEPARTMENT_OTHER): Payer: BLUE CROSS/BLUE SHIELD

## 2017-04-20 ENCOUNTER — Ambulatory Visit: Payer: BLUE CROSS/BLUE SHIELD | Admitting: Radiation Oncology

## 2017-04-20 ENCOUNTER — Telehealth: Payer: Self-pay | Admitting: Hematology

## 2017-04-20 ENCOUNTER — Ambulatory Visit (HOSPITAL_BASED_OUTPATIENT_CLINIC_OR_DEPARTMENT_OTHER): Payer: BLUE CROSS/BLUE SHIELD | Admitting: Hematology

## 2017-04-20 ENCOUNTER — Ambulatory Visit: Payer: BLUE CROSS/BLUE SHIELD

## 2017-04-20 VITALS — BP 115/68 | HR 85 | Temp 98.3°F | Resp 20 | Ht 64.75 in | Wt 131.2 lb

## 2017-04-20 DIAGNOSIS — Z170 Estrogen receptor positive status [ER+]: Principal | ICD-10-CM

## 2017-04-20 DIAGNOSIS — C50412 Malignant neoplasm of upper-outer quadrant of left female breast: Secondary | ICD-10-CM

## 2017-04-20 DIAGNOSIS — E039 Hypothyroidism, unspecified: Secondary | ICD-10-CM

## 2017-04-20 DIAGNOSIS — F329 Major depressive disorder, single episode, unspecified: Secondary | ICD-10-CM

## 2017-04-20 DIAGNOSIS — F320 Major depressive disorder, single episode, mild: Secondary | ICD-10-CM

## 2017-04-20 LAB — CBC WITH DIFFERENTIAL/PLATELET
BASO%: 0.2 % (ref 0.0–2.0)
Basophils Absolute: 0 10*3/uL (ref 0.0–0.1)
EOS ABS: 0.2 10*3/uL (ref 0.0–0.5)
EOS%: 4 % (ref 0.0–7.0)
HCT: 38 % (ref 34.8–46.6)
HGB: 12.9 g/dL (ref 11.6–15.9)
LYMPH%: 27.9 % (ref 14.0–49.7)
MCH: 31 pg (ref 25.1–34.0)
MCHC: 34 g/dL (ref 31.5–36.0)
MCV: 91.4 fL (ref 79.5–101.0)
MONO#: 0.5 10*3/uL (ref 0.1–0.9)
MONO%: 10.1 % (ref 0.0–14.0)
NEUT%: 57.8 % (ref 38.4–76.8)
NEUTROS ABS: 2.9 10*3/uL (ref 1.5–6.5)
PLATELETS: 249 10*3/uL (ref 145–400)
RBC: 4.16 10*6/uL (ref 3.70–5.45)
RDW: 12.7 % (ref 11.2–14.5)
WBC: 5.1 10*3/uL (ref 3.9–10.3)
lymph#: 1.4 10*3/uL (ref 0.9–3.3)

## 2017-04-20 LAB — COMPREHENSIVE METABOLIC PANEL
ALBUMIN: 3.9 g/dL (ref 3.5–5.0)
ALT: 7 U/L (ref 0–55)
AST: 19 U/L (ref 5–34)
Alkaline Phosphatase: 71 U/L (ref 40–150)
Anion Gap: 8 mEq/L (ref 3–11)
BUN: 13.4 mg/dL (ref 7.0–26.0)
CHLORIDE: 107 meq/L (ref 98–109)
CO2: 26 mEq/L (ref 22–29)
Calcium: 9.2 mg/dL (ref 8.4–10.4)
Creatinine: 0.7 mg/dL (ref 0.6–1.1)
EGFR: >60 mL/min/{1.73_m2} (ref >60)
GLUCOSE: 84 mg/dL (ref 70–140)
POTASSIUM: 3.9 meq/L (ref 3.5–5.1)
SODIUM: 141 meq/L (ref 136–145)
Total Bilirubin: 0.57 mg/dL (ref 0.20–1.20)
Total Protein: 7 g/dL (ref 6.4–8.3)

## 2017-04-20 NOTE — Telephone Encounter (Signed)
Scheduled appt per 10/23 los - sent reminder letter int he mail f/u in 3 weeks.

## 2017-04-21 ENCOUNTER — Encounter: Payer: Self-pay | Admitting: Hematology

## 2017-04-21 MED ORDER — LETROZOLE 2.5 MG PO TABS: 2.5000 mg | ORAL_TABLET | Freq: Every day | ORAL | 1 refills | Status: DC

## 2017-04-23 ENCOUNTER — Other Ambulatory Visit: Payer: Self-pay | Admitting: Hematology

## 2017-04-23 ENCOUNTER — Telehealth: Payer: Self-pay | Admitting: Hematology

## 2017-04-23 MED ORDER — CAPECITABINE 500 MG PO TABS: 1000.0000 mg/m2 | ORAL_TABLET | Freq: Two times a day (BID) | ORAL | 2 refills | Status: DC

## 2017-04-23 NOTE — Telephone Encounter (Signed)
I left a message on her phone: I have called in letrozole and want her to start. I have sent a prescription of Xeloda to our oral pharmacy, will figure out her co-pay first, do not fill until see me next she makes a decision about her second surgery.  Truitt Merle  04/23/2017

## 2017-04-26 ENCOUNTER — Telehealth: Payer: Self-pay | Admitting: Pharmacy Technician

## 2017-04-26 NOTE — Telephone Encounter (Signed)
Oral Oncology Patient Advocate Encounter  Received notification from [facility] that Prior Authorization for Xeloda was required.  This request was submitted and has been approved.    Effective dates: 04/26/2017 through 04/25/2018  I contacted the insurance company obtain the patient's copayment ([facility] is outside of the patient's pharmacy network).  I was informed that the patient's copayment for capecitabine at this time is $0.    Oral Oncology Clinic will continue to follow.   Fabio Asa. Melynda Keller, [facility] [place] [phone] 04/26/2017 10:53 AM

## 2017-04-27 ENCOUNTER — Telehealth: Payer: Self-pay | Admitting: Pharmacist

## 2017-04-27 NOTE — Telephone Encounter (Signed)
Oral Oncology Pharmacist Encounter  Received new prescription for Xeloda (capecitabine) for the adjuvant treatment of breast cancer, planned duration 4-6 cycles. Noted discussion of 2nd surgery planning, Xeloda not to start until patient healed from 2nd surgery per discussion with MD.  Labs from 10/23 assessed, OK for treatment.  Current medication list in Epic reviewed, no significant DDIs with Xeloda identified.  Prescription will be on hold until further direction from MD. It will need to be filled through specific specialty pharmacy per insurance requirement.  I called and LVM for patient to discuss $0 copayment for remainder of 2018 calendar year. We will not know 2019 copayments until new benefit year hits 06/29/17.  Oral Oncology Clinic will continue to follow for issues, initial counseling and start date.  Johny Drilling, PharmD, BCPS, BCOP 04/27/2017 2:13 PM Oral Oncology Clinic [phone]

## 2017-04-28 ENCOUNTER — Ambulatory Visit
Admission: RE | Admit: 2017-04-28 | Discharge: 2017-04-28 | Disposition: A | Discharge status: Home / Self Care | Payer: BLUE CROSS/BLUE SHIELD | Source: Ambulatory Visit | Attending: Radiation Oncology | Admitting: Radiation Oncology

## 2017-04-28 ENCOUNTER — Ambulatory Visit: Payer: BLUE CROSS/BLUE SHIELD

## 2017-04-28 DIAGNOSIS — Z818 Family history of other mental and behavioral disorders: Secondary | ICD-10-CM | POA: Insufficient documentation

## 2017-04-28 DIAGNOSIS — Z170 Estrogen receptor positive status [ER+]: Secondary | ICD-10-CM | POA: Insufficient documentation

## 2017-04-28 DIAGNOSIS — Z803 Family history of malignant neoplasm of breast: Secondary | ICD-10-CM | POA: Insufficient documentation

## 2017-04-28 DIAGNOSIS — C50412 Malignant neoplasm of upper-outer quadrant of left female breast: Secondary | ICD-10-CM | POA: Insufficient documentation

## 2017-04-28 DIAGNOSIS — Z8249 Family history of ischemic heart disease and other diseases of the circulatory system: Secondary | ICD-10-CM | POA: Insufficient documentation

## 2017-04-28 DIAGNOSIS — Z9889 Other specified postprocedural states: Secondary | ICD-10-CM | POA: Insufficient documentation

## 2017-04-28 DIAGNOSIS — Z885 Allergy status to narcotic agent status: Secondary | ICD-10-CM | POA: Insufficient documentation

## 2017-04-28 DIAGNOSIS — Z79891 Long term (current) use of opiate analgesic: Secondary | ICD-10-CM | POA: Insufficient documentation

## 2017-04-28 DIAGNOSIS — F329 Major depressive disorder, single episode, unspecified: Secondary | ICD-10-CM | POA: Insufficient documentation

## 2017-04-28 DIAGNOSIS — E079 Disorder of thyroid, unspecified: Secondary | ICD-10-CM | POA: Insufficient documentation

## 2017-04-28 DIAGNOSIS — F419 Anxiety disorder, unspecified: Secondary | ICD-10-CM | POA: Insufficient documentation

## 2017-04-28 DIAGNOSIS — E039 Hypothyroidism, unspecified: Secondary | ICD-10-CM | POA: Insufficient documentation

## 2017-04-28 DIAGNOSIS — Z510 Encounter for antineoplastic radiation therapy: Secondary | ICD-10-CM | POA: Insufficient documentation

## 2017-04-28 DIAGNOSIS — Z79899 Other long term (current) drug therapy: Secondary | ICD-10-CM | POA: Insufficient documentation

## 2017-04-29 ENCOUNTER — Ambulatory Visit: Payer: Self-pay | Admitting: General Surgery

## 2017-05-03 ENCOUNTER — Encounter (HOSPITAL_BASED_OUTPATIENT_CLINIC_OR_DEPARTMENT_OTHER): Payer: Self-pay | Admitting: *Deleted

## 2017-05-03 ENCOUNTER — Encounter: Payer: Self-pay | Admitting: Radiation Oncology

## 2017-05-04 ENCOUNTER — Ambulatory Visit (HOSPITAL_BASED_OUTPATIENT_CLINIC_OR_DEPARTMENT_OTHER)
Admission: RE | Admit: 2017-05-04 | Discharge: 2017-05-04 | Disposition: A | Discharge status: Home / Self Care | Payer: BLUE CROSS/BLUE SHIELD | Source: Ambulatory Visit | Attending: General Surgery | Admitting: General Surgery

## 2017-05-04 ENCOUNTER — Encounter (HOSPITAL_BASED_OUTPATIENT_CLINIC_OR_DEPARTMENT_OTHER)
Admission: RE | Disposition: A | Discharge status: Home / Self Care | Payer: Self-pay | Source: Ambulatory Visit | Attending: General Surgery

## 2017-05-04 ENCOUNTER — Ambulatory Visit (HOSPITAL_BASED_OUTPATIENT_CLINIC_OR_DEPARTMENT_OTHER): Payer: BLUE CROSS/BLUE SHIELD | Admitting: Anesthesiology

## 2017-05-04 ENCOUNTER — Encounter (HOSPITAL_BASED_OUTPATIENT_CLINIC_OR_DEPARTMENT_OTHER): Payer: Self-pay

## 2017-05-04 ENCOUNTER — Other Ambulatory Visit: Payer: Self-pay

## 2017-05-04 DIAGNOSIS — N6032 Fibrosclerosis of left breast: Secondary | ICD-10-CM | POA: Diagnosis not present

## 2017-05-04 DIAGNOSIS — Z780 Asymptomatic menopausal state: Secondary | ICD-10-CM | POA: Diagnosis not present

## 2017-05-04 DIAGNOSIS — C50412 Malignant neoplasm of upper-outer quadrant of left female breast: Secondary | ICD-10-CM | POA: Insufficient documentation

## 2017-05-04 DIAGNOSIS — Z79899 Other long term (current) drug therapy: Secondary | ICD-10-CM | POA: Diagnosis not present

## 2017-05-04 DIAGNOSIS — E039 Hypothyroidism, unspecified: Secondary | ICD-10-CM | POA: Insufficient documentation

## 2017-05-04 DIAGNOSIS — C50912 Malignant neoplasm of unspecified site of left female breast: Secondary | ICD-10-CM | POA: Diagnosis not present

## 2017-05-04 DIAGNOSIS — F418 Other specified anxiety disorders: Secondary | ICD-10-CM | POA: Insufficient documentation

## 2017-05-04 HISTORY — PX: RE-EXCISION OF BREAST LUMPECTOMY: SHX6048

## 2017-05-04 SURGERY — EXCISION, LESION, BREAST
Anesthesia: General | Site: Breast | Laterality: Left

## 2017-05-04 MED ORDER — OXYCODONE HCL 5 MG PO TABS: 5.0000 mg | ORAL_TABLET | Freq: Once | ORAL | Status: DC | PRN

## 2017-05-04 MED ORDER — GABAPENTIN 300 MG PO CAPS
ORAL_CAPSULE | ORAL | Status: AC
  Filled 2017-05-04: qty 1

## 2017-05-04 MED ORDER — ACETAMINOPHEN 500 MG PO TABS
ORAL_TABLET | ORAL | Status: AC
  Filled 2017-05-04: qty 2

## 2017-05-04 MED ORDER — MIDAZOLAM HCL 2 MG/2ML IJ SOLN
1.0000 mg | INTRAMUSCULAR | Status: DC | PRN
  Administered 2017-05-04: 2 mg via INTRAVENOUS

## 2017-05-04 MED ORDER — MIDAZOLAM HCL 2 MG/2ML IJ SOLN
INTRAMUSCULAR | Status: AC
  Filled 2017-05-04: qty 2

## 2017-05-04 MED ORDER — CHLORHEXIDINE GLUCONATE CLOTH 2 % EX PADS: 6.0000 | MEDICATED_PAD | Freq: Once | CUTANEOUS | Status: DC

## 2017-05-04 MED ORDER — BUPIVACAINE-EPINEPHRINE 0.5% -1:200000 IJ SOLN
INTRAMUSCULAR | Status: DC | PRN
  Administered 2017-05-04: 10 mL

## 2017-05-04 MED ORDER — ONDANSETRON HCL 4 MG/2ML IJ SOLN
4.0000 mg | Freq: Four times a day (QID) | INTRAMUSCULAR | Status: AC | PRN
  Administered 2017-05-04: 4 mg via INTRAVENOUS

## 2017-05-04 MED ORDER — LIDOCAINE HCL (CARDIAC) 20 MG/ML IV SOLN
INTRAVENOUS | Status: DC | PRN
  Administered 2017-05-04: 50 mg via INTRAVENOUS

## 2017-05-04 MED ORDER — SCOPOLAMINE 1 MG/3DAYS TD PT72
MEDICATED_PATCH | TRANSDERMAL | Status: AC
  Filled 2017-05-04: qty 1

## 2017-05-04 MED ORDER — ONDANSETRON HCL 4 MG/2ML IJ SOLN
INTRAMUSCULAR | Status: AC
  Filled 2017-05-04: qty 2

## 2017-05-04 MED ORDER — ONDANSETRON HCL 4 MG/2ML IJ SOLN
INTRAMUSCULAR | Status: DC | PRN
  Administered 2017-05-04: 4 mg via INTRAVENOUS

## 2017-05-04 MED ORDER — CELECOXIB 200 MG PO CAPS
200.0000 mg | ORAL_CAPSULE | ORAL | Status: AC
  Administered 2017-05-04: 200 mg via ORAL

## 2017-05-04 MED ORDER — FENTANYL CITRATE (PF) 100 MCG/2ML IJ SOLN
INTRAMUSCULAR | Status: AC
  Filled 2017-05-04: qty 2

## 2017-05-04 MED ORDER — GABAPENTIN 300 MG PO CAPS
300.0000 mg | ORAL_CAPSULE | ORAL | Status: AC
  Administered 2017-05-04: 300 mg via ORAL

## 2017-05-04 MED ORDER — ACETAMINOPHEN 500 MG PO TABS
1000.0000 mg | ORAL_TABLET | ORAL | Status: AC
  Administered 2017-05-04: 1000 mg via ORAL

## 2017-05-04 MED ORDER — FENTANYL CITRATE (PF) 100 MCG/2ML IJ SOLN
25.0000 ug | INTRAMUSCULAR | Status: DC | PRN
  Administered 2017-05-04: 25 ug via INTRAVENOUS
  Administered 2017-05-04: 50 ug via INTRAVENOUS

## 2017-05-04 MED ORDER — LACTATED RINGERS IV SOLN
INTRAVENOUS | Status: DC
  Administered 2017-05-04 (×2): via INTRAVENOUS

## 2017-05-04 MED ORDER — CELECOXIB 200 MG PO CAPS
ORAL_CAPSULE | ORAL | Status: AC
  Filled 2017-05-04: qty 1

## 2017-05-04 MED ORDER — CEFAZOLIN SODIUM-DEXTROSE 2-4 GM/100ML-% IV SOLN
2.0000 g | INTRAVENOUS | Status: AC
  Administered 2017-05-04: 2 g via INTRAVENOUS

## 2017-05-04 MED ORDER — CEFAZOLIN SODIUM-DEXTROSE 2-4 GM/100ML-% IV SOLN
INTRAVENOUS | Status: AC
  Filled 2017-05-04: qty 100

## 2017-05-04 MED ORDER — PROPOFOL 10 MG/ML IV BOLUS
INTRAVENOUS | Status: DC | PRN
  Administered 2017-05-04: 120 mg via INTRAVENOUS

## 2017-05-04 MED ORDER — DEXAMETHASONE SODIUM PHOSPHATE 4 MG/ML IJ SOLN
INTRAMUSCULAR | Status: DC | PRN
  Administered 2017-05-04: 10 mg via INTRAVENOUS

## 2017-05-04 MED ORDER — BUPIVACAINE-EPINEPHRINE (PF) 0.5% -1:200000 IJ SOLN
INTRAMUSCULAR | Status: AC
  Filled 2017-05-04: qty 30

## 2017-05-04 MED ORDER — EPHEDRINE SULFATE 50 MG/ML IJ SOLN
INTRAMUSCULAR | Status: DC | PRN
  Administered 2017-05-04: 15 mg via INTRAVENOUS

## 2017-05-04 MED ORDER — PROPOFOL 10 MG/ML IV BOLUS
INTRAVENOUS | Status: AC
  Filled 2017-05-04: qty 20

## 2017-05-04 MED ORDER — PHENYLEPHRINE HCL 10 MG/ML IJ SOLN
INTRAMUSCULAR | Status: DC | PRN
  Administered 2017-05-04: 80 ug via INTRAVENOUS

## 2017-05-04 MED ORDER — FENTANYL CITRATE (PF) 100 MCG/2ML IJ SOLN
50.0000 ug | INTRAMUSCULAR | Status: DC | PRN
  Administered 2017-05-04: 100 ug via INTRAVENOUS

## 2017-05-04 MED ORDER — SCOPOLAMINE 1 MG/3DAYS TD PT72
1.0000 | MEDICATED_PATCH | Freq: Once | TRANSDERMAL | Status: DC | PRN
  Administered 2017-05-04: 1.5 mg via TRANSDERMAL

## 2017-05-04 MED ORDER — OXYCODONE HCL 5 MG/5ML PO SOLN: 5.0000 mg | Freq: Once | ORAL | Status: DC | PRN

## 2017-05-04 SURGICAL SUPPLY — 40 items
ADH SKN CLS APL DERMABOND .7 (GAUZE/BANDAGES/DRESSINGS) ×1
BLADE SURG 15 STRL LF DISP TIS (BLADE) ×1 IMPLANT
BLADE SURG 15 STRL SS (BLADE) ×2
CANISTER SUCT 1200ML W/VALVE (MISCELLANEOUS) ×2 IMPLANT
CHLORAPREP W/TINT 26ML (MISCELLANEOUS) ×2 IMPLANT
CLIP VESOCCLUDE SM WIDE 6/CT (CLIP) IMPLANT
COVER BACK TABLE 60X90IN (DRAPES) ×2 IMPLANT
COVER MAYO STAND STRL (DRAPES) ×2 IMPLANT
DERMABOND ADVANCED (GAUZE/BANDAGES/DRESSINGS) ×1
DERMABOND ADVANCED .7 DNX12 (GAUZE/BANDAGES/DRESSINGS) ×1 IMPLANT
DEVICE DUBIN W/COMP PLATE 8390 (MISCELLANEOUS) IMPLANT
DRAPE LAPAROSCOPIC ABDOMINAL (DRAPES) ×2 IMPLANT
DRAPE UTILITY XL STRL (DRAPES) ×2 IMPLANT
ELECT COATED BLADE 2.86 ST (ELECTRODE) ×2 IMPLANT
ELECT REM PT RETURN 9FT ADLT (ELECTROSURGICAL) ×2
ELECTRODE REM PT RTRN 9FT ADLT (ELECTROSURGICAL) ×1 IMPLANT
GLOVE BIO SURGEON STRL SZ7 (GLOVE) ×2 IMPLANT
GLOVE BIOGEL PI IND STRL 8 (GLOVE) ×1 IMPLANT
GLOVE BIOGEL PI INDICATOR 8 (GLOVE) ×1
GLOVE ECLIPSE 7.5 STRL STRAW (GLOVE) ×2 IMPLANT
GOWN STRL REUS W/ TWL LRG LVL3 (GOWN DISPOSABLE) ×1 IMPLANT
GOWN STRL REUS W/ TWL XL LVL3 (GOWN DISPOSABLE) ×2 IMPLANT
GOWN STRL REUS W/TWL LRG LVL3 (GOWN DISPOSABLE) ×2
GOWN STRL REUS W/TWL XL LVL3 (GOWN DISPOSABLE) ×4
KIT MARKER MARGIN INK (KITS) IMPLANT
NDL HYPO 25X1 1.5 SAFETY (NEEDLE) ×1 IMPLANT
NEEDLE HYPO 25X1 1.5 SAFETY (NEEDLE) ×2 IMPLANT
NS IRRIG 1000ML POUR BTL (IV SOLUTION) ×1 IMPLANT
PACK BASIN DAY SURGERY FS (CUSTOM PROCEDURE TRAY) ×2 IMPLANT
PENCIL BUTTON HOLSTER BLD 10FT (ELECTRODE) ×2 IMPLANT
SLEEVE SCD COMPRESS KNEE MED (MISCELLANEOUS) ×2 IMPLANT
SPONGE LAP 4X18 X RAY DECT (DISPOSABLE) IMPLANT
SUT MON AB 5-0 PS2 18 (SUTURE) ×2 IMPLANT
SUT VICRYL 3-0 CR8 SH (SUTURE) ×2 IMPLANT
SYR BULB 3OZ (MISCELLANEOUS) ×1 IMPLANT
SYR CONTROL 10ML LL (SYRINGE) ×2 IMPLANT
TOWEL OR 17X24 6PK STRL BLUE (TOWEL DISPOSABLE) ×2 IMPLANT
TOWEL OR NON WOVEN STRL DISP B (DISPOSABLE) ×2 IMPLANT
TUBE CONNECTING 20X1/4 (TUBING) ×2 IMPLANT
YANKAUER SUCT BULB TIP NO VENT (SUCTIONS) ×2 IMPLANT

## 2017-05-04 NOTE — Interval H&P Note (Signed)
History and Physical Interval Note:  05/04/2017 11:36 AM  Angela [name]  has presented today for surgery, with the diagnosis of LEFT BREAST CANCER  The various methods of treatment have been discussed with the patient and family. After consideration of risks, benefits and other options for treatment, the patient has consented to  Procedure(s): RE-EXCISION OF BREAST LUMPECTOMY (Left) as a surgical intervention .  The patient's history has been reviewed, patient examined, no change in status, stable for surgery.  I have reviewed the patient's chart and labs.  Questions were answered to the patient's satisfaction.     Sidda Humm T

## 2017-05-04 NOTE — Op Note (Signed)
Preoperative Diagnosis: LEFT BREAST CANCER  Postoprative Diagnosis: LEFT BREAST CANCER  Procedure: Procedure(s): RE-EXCISION OF BREAST LUMPECTOMY   Surgeon: Excell Seltzer T   Assistants: None  Anesthesia:  General LMA anesthesia  Indications: Patient is status post recent radioactive seed localized left breast lumpectomy and left axillary sentinel lymph node biopsy.  Final pathology has shown a positive medial margin at her lumpectomy.  I discussed options with the patient and actually leaning toward mastectomy as best treatment however she has declined mastectomy but will except reexcision of her lumpectomy.  I discussed indications and risks and possible further positive margins.  She understands and agrees to proceed.    Procedure Detail: Patient was brought to the operating room, placed in the supine position on the operating table, and laryngeal mask general anesthesia induced.  The left breast was widely sterilely prepped and draped.  She received preoperative IV antibiotics.  Patient timeout was performed and correct procedure verified.  The previous curvilinear lumpectomy incision in the lateral left breast was opened and dissection carried down through the subcutaneous tissue.  Using cautery and blunt dissection the previous lumpectomy cavity was completely reopened and suture material divided.  The previous posterior and medial clips were identified.  I then exposed the medial margin and completely excised this with cautery back another centimeter.  This was taken from the subcutaneous tissue to the chest wall as she has very thin breast tissue.  There was some nodularity along the superior margin that very well may have been postoperative change but I did re-excise this for about half a centimeter as well.  Both of these reexcised margins were inked and sent separately for pathology.  The breast tissue at this point was dense but there was no mass or irregularity to the tissue.   Hemostasis was obtained.  Soft tissue was infiltrated with Marcaine.  The reexcised margins were marked with clips.  The deep breast and subtendinous tissue was closed with interrupted 3-0 Vicryl and the skin with running subcuticular 4-0 Monocryl and Dermabond.  Sponge needle and instrument counts were correct.    Findings: As above  Estimated Blood Loss:  Minimal         Drains: None  Blood Given: none          Specimens: #1 reexcision medial margin new margin inked to reexcision superior margin new margin inked        Complications:  * No complications entered in OR log *         Disposition: PACU - hemodynamically stable.         Condition: stable

## 2017-05-04 NOTE — Transfer of Care (Signed)
Immediate Anesthesia Transfer of Care Note  Patient: Angela [name]  Procedure(s) Performed: RE-EXCISION OF BREAST LUMPECTOMY (Left Breast)  Patient Location: PACU  Anesthesia Type:General  Level of Consciousness: awake, alert  and oriented  Airway & Oxygen Therapy: Patient Spontanous Breathing and Patient connected to face mask oxygen  Post-op Assessment: Report given to RN and Post -op Vital signs reviewed and stable  Post vital signs: Reviewed and stable  Last Vitals:  Vitals:   05/04/17 1007  BP: 106/66  Pulse: 79  Resp: 18  Temp: 36.6 C  SpO2: 98%    Last Pain:  Vitals:   05/04/17 1007  TempSrc: Oral         Complications: No apparent anesthesia complications

## 2017-05-04 NOTE — H&P (Signed)
History of Present Illness Marland Kitchen T. Cass Edinger MD; 04/29/2017 10:00 AM) The patient is a 61 year old female who presents with breast cancer. She returns following left breast lumpectomy and targeted left axillary sentinel lymph node biopsy after completion of neoadjuvant chemotherapy.  Her original presentation was as follows:  She is a postmenopausal female referred by Dr. Dorise Bullion for evaluation of recently diagnosed carcinoma of the left breast. The patient was recently able to feel a mass in her upper left breast. She presented to her primary physician for evaluation and was referred to the breast Center for workup. Subsequent imaging included diagnostic mamogram showing an irregular mass in the upper-outer quadrant of the left breast associated with microcalcifications corresponding to the palpable lump and an additional mass in the lower left axilla likely lymph node and ultrasound showing 2 adjacent masses in the left breast at the 1 o'clock position 3 cm from the nipple measuring 2.1 and 2.2 cm respectively and overall measuring 5.1 cm in extent as well as a 1.1 cm mass in the low left axilla corresponding to the likely enlarged lymph node.. 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 She is seen now in the office for initial treatment planning. She has experienced a mass but no other symptoms such as skin changes or pain or nipple discharge. She does not have a personal history of any previous breast problems.  Findings at that time were the following: Tumor size: 2.1 and 2.2 cm adjacent with total 5.1 cm extent of disease  Tumor grade: 2 Estrogen Receptor: 100% positive Progesterone Receptor: Negative Her-2 neu: Negative Lymph node status: Positive   Preoperative MRI  showed the 2 adjacent masses in the upper outer left breast with significant reduction in size with the larger measuring 1.1 cm and just anterior and superior to this is a 0.7 cm mass. No axillary adenopathy seen on MRI.  On final pathology the targeted left axillary lymph node actually histologically appeared to be a primary breast cancer. A total of 7 sentinel lymph nodes were removed with one being positive. Her breast pathology showed a solid papillary carcinoma 1.6 cm with medial margin involved by carcinoma. Posterior margin also reported as involved but I removed the pectoralis fascia with no evidence of muscle involvement.      Problem List/Past Medical Marland Kitchen T. 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 Christy Ehrsam, MD; 04/29/2017 10:04 AM) Breast Biopsy  Left. Thyroid Surgery   Diagnostic Studies History Marland Kitchen T. Laquasia Pincus, MD; 04/29/2017 10:04 AM) Colonoscopy  never Mammogram  >3 years ago Pap Smear  1-5 years ago  Allergies Malachy Moan, RMA; 04/29/2017 9:13 AM) Morphine Derivatives  Rash.  Medication History Malachy Moan, RMA; 04/29/2017 9:15 AM) Anastrozole (1MG Tablet, Oral) Active. Armour Thyroid (15MG Tablet, Oral) Active. Omega 3 (1000MG Capsule, Oral) Active. Medications Reconciled  Social History Marland Kitchen T. Yahayra Geis, MD; 04/29/2017 10:04 AM) Alcohol use  Moderate alcohol use. Caffeine use  Coffee. Tobacco use  Never smoker.  Family History Marland Kitchen T. Shalynn Jorstad, MD; 04/29/2017 10:04 AM) Alcohol Abuse  Father, Sister. Breast Cancer  Mother. Depression  Father, Mother, Sister. Heart Disease  Family Members In General. Thyroid problems  Sister.  Pregnancy / Birth History Marland Kitchen T. Fredric Slabach, MD; 04/29/2017 10:04  AM) Age at menarche  58 years. Age of menopause  [age] Contraceptive History  Oral contraceptives. Gravida  1 Maternal age  43-40 Para   0  Other Problems Marland Kitchen T. Soha Thorup, MD; 04/29/2017 10:04 AM) Anxiety Disorder  Breast Cancer  Depression  Thyroid Disease   Vitals Malachy Moan RMA; 04/29/2017 9:15 AM) 04/29/2017 9:15 AM Weight: 132 lb Height: 64in Body Surface Area: 1.64 m Body Mass Index: 22.66 kg/m  Temp.: 97.66F  Pulse: 100 (Regular)  BP: 110/70 (Sitting, Left Arm, Standard)       Physical Exam Marland Kitchen T. Carmelia Tiner MD; 04/29/2017 10:01 AM) The physical exam findings are as follows: Note:General: Alert, thin healthy-appearing Caucasian female, in no distress Skin: Warm and dry without rash or infection. Left breast incision healing well as is the axillary incision without complication factors. Lungs: Breath sounds clear and equal. No wheezing or increased work of breathing. Cardiovascular: Regular rate and rhythm without murmer. No JVD or edema. Peripheral pulses intact. No carotid bruits. Neurologic: Alert and fully oriented. Gait normal. No focal weakness. Psychiatric: Normal mood and affect. Thought content appropriate with normal judgement and insight    Assessment & Plan Marland Kitchen T. Janeane Cozart MD; 04/29/2017 10:04 AM) STAGE II BREAST CANCER, LEFT (B37.943) Impression: 61 year old female with a diagnosis of cancer of the left breast, upper outer quadrant. Clinical stage 2A were to be if her tumor is classified as a single 5 cm mass, ER positive, PR negative, HER-2 negative. She has had a very good response to neoadjuvant chemotherapy with resolution of her abnormal left axillary lymph node and significant shrinkage of the 2 adjacent masses in the upper outer quadrant of the left breast. Subsequent lumpectomy and targeted axillary dissection revealed actually was thought to be a low axillary lymph node was a separate breast mass. Origins were not obtained on this as we thought it was a lymph node but review seems to indicate it was located centrally within the tissue. She has a  positive medial and posterior margin on her breast mass. I'm not worried about the posterior margin as we took pectoralis fascia but her medial margin is involved. I discussed with her that I think standard recommendation now that we know she has 2 separate breast masses and a positive margin on the medial mass and difficulty assessing the margin on the small axillary mass would be total mastectomy. However she under no circumstances wants to proceed with a mastectomy. As an option I did discuss reexcised in her breast to obtain a negative medial margin and follow with radiation. She is amenable to this. We will go ahead and schedule her for reexcision of the medial margin of her left breast lumpectomy. Current Plans Schedule for Surgery  Reexcision medial margin of left breast lumpectomy under general anesthesia as an outpatient

## 2017-05-04 NOTE — Anesthesia Procedure Notes (Signed)
Procedure Name: Intubation Date/Time: 05/04/2017 11:49 AM Performed by: Willa Frater, CRNA Pre-anesthesia Checklist: Patient identified, Emergency Drugs available, Suction available and Patient being monitored Patient Re-evaluated:Patient Re-evaluated prior to induction Oxygen Delivery Method: Circle system utilized Preoxygenation: Pre-oxygenation with 100% oxygen Induction Type: IV induction Ventilation: Mask ventilation without difficulty LMA: LMA inserted LMA Size: 3.0 Tube type: Oral Number of attempts: 1 Airway Equipment and Method: Stylet and Oral airway Placement Confirmation: ETT inserted through vocal cords under direct vision,  positive ETCO2 and breath sounds checked- equal and bilateral Tube secured with: Tape Dental Injury: Teeth and Oropharynx as per pre-operative assessment

## 2017-05-04 NOTE — Anesthesia Postprocedure Evaluation (Signed)
Anesthesia Post Note  Patient: Angela [name]  Procedure(s) Performed: RE-EXCISION OF BREAST LUMPECTOMY (Left Breast)     Patient location during evaluation: PACU Anesthesia Type: General Level of consciousness: awake and alert Pain management: pain level controlled Vital Signs Assessment: post-procedure vital signs reviewed and stable Respiratory status: spontaneous breathing, nonlabored ventilation, respiratory function stable and patient connected to nasal cannula oxygen Cardiovascular status: blood pressure returned to baseline and stable Postop Assessment: no apparent nausea or vomiting Anesthetic complications: no    Last Vitals:  Vitals:   05/04/17 1345 05/04/17 1430  BP: 105/67 121/73  Pulse: 79 83  Resp: 11 16  Temp:  36.4 C  SpO2: 97% 99%    Last Pain:  Vitals:   05/04/17 1430  TempSrc:   PainSc: 3                  Alrick Cubbage S

## 2017-05-04 NOTE — Anesthesia Preprocedure Evaluation (Signed)
Anesthesia Evaluation  Patient identified by MRN, date of birth, ID band Patient awake    Reviewed: Allergy & Precautions, H&P , NPO status , Patient's Chart, lab work & pertinent test results  Airway Mallampati: II   Neck ROM: full    Dental   Pulmonary neg pulmonary ROS,    breath sounds clear to auscultation       Cardiovascular negative cardio ROS   Rhythm:regular Rate:Normal     Neuro/Psych PSYCHIATRIC DISORDERS Anxiety Depression    GI/Hepatic   Endo/Other  Hypothyroidism   Renal/GU      Musculoskeletal   Abdominal   Peds  Hematology   Anesthesia Other Findings   Reproductive/Obstetrics                             Anesthesia Physical Anesthesia Plan  ASA: II  Anesthesia Plan: General   Post-op Pain Management:    Induction: Intravenous  PONV Risk Score and Plan: 3 and Ondansetron, Dexamethasone, Midazolam and Treatment may vary due to age or medical condition  Airway Management Planned: LMA  Additional Equipment:   Intra-op Plan:   Post-operative Plan:   Informed Consent: I have reviewed the patients History and Physical, chart, labs and discussed the procedure including the risks, benefits and alternatives for the proposed anesthesia with the patient or authorized representative who has indicated his/her understanding and acceptance.     Plan Discussed with: CRNA, Anesthesiologist and Surgeon  Anesthesia Plan Comments:         Anesthesia Quick Evaluation

## 2017-05-04 NOTE — Discharge Instructions (Signed)
[place] Office Phone Number [phone]  BREAST BIOPSY/ LUMPECTOMY: POST OP INSTRUCTIONS   Always review your discharge instruction sheet given to you by the facility where your surgery was performed.  IF YOU HAVE DISABILITY OR FAMILY LEAVE FORMS, YOU MUST BRING THEM TO THE OFFICE FOR PROCESSING.  DO NOT GIVE THEM TO YOUR DOCTOR.  1. A prescription for pain medication may be given to you upon discharge.  Take your pain medication as prescribed, if needed.  If narcotic pain medicine is not needed, then you may take acetaminophen (Tylenol) or ibuprofen (Advil) as needed. NO Tylenol until 5pm 2. Take your usually prescribed medications unless otherwise directed   3. If you need a refill on your pain medication, please contact your pharmacy.  They will contact our office to request authorization.  Prescriptions will not be filled after 5pm or on week-ends. 4. You should eat very light the first 24 hours after surgery, such as soup, crackers, pudding, etc.  Resume your normal diet the day after surgery. 5. Most patients will experience some swelling and bruising in the breast.  Ice packs and a good support bra will help.  Swelling and bruising can take several days to resolve.  6. It is common to experience some constipation if taking pain medication after surgery.  Increasing fluid intake and taking a stool softener will usually help or prevent this problem from occurring.  A mild laxative (Milk of Magnesia or Miralax) should be taken according to package directions if there are no bowel movements after 48 hours. 7. Unless discharge instructions indicate otherwise, you may remove your bandages 24-48 hours after surgery, and you may shower at that time.  You may have steri-strips (small skin tapes) in place directly over the incision.  These strips should be left on the skin for 7-10 days.  If your surgeon used skin glue on the incision, you may shower in 24 hours.  The glue will flake  off over the next 2-3 weeks.  Any sutures or staples will be removed at the office during your follow-up visit. 8. ACTIVITIES:  You may resume regular daily activities (gradually increasing) beginning the next day.  Wearing a good support bra or sports bra minimizes pain and swelling.  You may have sexual intercourse when it is comfortable. a. You may drive when you no longer are taking prescription pain medication, you can comfortably wear a seatbelt, and you can safely maneuver your car and apply brakes. b. RETURN TO WORK:  ______________________________________________________________________________________ 9. You should see your doctor in the office for a follow-up appointment approximately two weeks after your surgery.  Your doctors nurse will typically make your follow-up appointment when she calls you with your pathology report.  Expect your pathology report 2-3 business days after your surgery.  You may call to check if you do not hear from [place] after three days. 10. OTHER INSTRUCTIONS: _______________________________________________________________________________________________ _____________________________________________________________________________________________________________________________________ _____________________________________________________________________________________________________________________________________ _____________________________________________________________________________________________________________________________________  WHEN TO CALL YOUR DOCTOR: 1. Fever over 101.0 2. Nausea and/or vomiting. 3. Extreme swelling or bruising. 4. Continued bleeding from incision. 5. Increased pain, redness, or drainage from the incision.  The clinic staff is available to answer your questions during regular business hours.  Please dont hesitate to call and ask to speak to one of the nurses for clinical concerns.  If you have a medical emergency, go to the  nearest emergency room or call 911.  A surgeon from [facility] is always on call at the hospital.  For  further questions, please visit [email or web address]     Post Anesthesia Home Care Instructions  Activity: Get plenty of rest for the remainder of the day. A responsible individual must stay with you for 24 hours following the procedure.  For the next 24 hours, DO NOT: -Drive a car -Paediatric nurse -Drink alcoholic beverages -Take any medication unless instructed by your physician -Make any legal decisions or sign important papers.  Meals: Start with liquid foods such as gelatin or soup. Progress to regular foods as tolerated. Avoid greasy, spicy, heavy foods. If nausea and/or vomiting occur, drink only clear liquids until the nausea and/or vomiting subsides. Call your physician if vomiting continues.  Special Instructions/Symptoms: Your throat may feel dry or sore from the anesthesia or the breathing tube placed in your throat during surgery. If this causes discomfort, gargle with warm salt water. The discomfort should disappear within 24 hours.  If you had a scopolamine patch placed behind your ear for the management of post- operative nausea and/or vomiting:  1. The medication in the patch is effective for 72 hours, after which it should be removed.  Wrap patch in a tissue and discard in the trash. Wash hands thoroughly with soap and water. 2. You may remove the patch earlier than 72 hours if you experience unpleasant side effects which may include dry mouth, dizziness or visual disturbances. 3. Avoid touching the patch. Wash your hands with soap and water after contact with the patch.

## 2017-05-05 ENCOUNTER — Encounter (HOSPITAL_BASED_OUTPATIENT_CLINIC_OR_DEPARTMENT_OTHER): Payer: Self-pay | Admitting: General Surgery
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
## 2017-05-10 ENCOUNTER — Encounter: Payer: Self-pay | Admitting: Hematology

## 2017-05-10 ENCOUNTER — Other Ambulatory Visit (HOSPITAL_BASED_OUTPATIENT_CLINIC_OR_DEPARTMENT_OTHER): Payer: BLUE CROSS/BLUE SHIELD

## 2017-05-10 ENCOUNTER — Ambulatory Visit (HOSPITAL_BASED_OUTPATIENT_CLINIC_OR_DEPARTMENT_OTHER): Payer: BLUE CROSS/BLUE SHIELD | Admitting: Hematology

## 2017-05-10 VITALS — BP 100/69 | HR 86 | Temp 97.7°F | Resp 16 | Ht 64.5 in | Wt 132.3 lb

## 2017-05-10 DIAGNOSIS — E039 Hypothyroidism, unspecified: Secondary | ICD-10-CM | POA: Diagnosis not present

## 2017-05-10 DIAGNOSIS — C50412 Malignant neoplasm of upper-outer quadrant of left female breast: Secondary | ICD-10-CM

## 2017-05-10 DIAGNOSIS — C773 Secondary and unspecified malignant neoplasm of axilla and upper limb lymph nodes: Secondary | ICD-10-CM

## 2017-05-10 DIAGNOSIS — Z170 Estrogen receptor positive status [ER+]: Secondary | ICD-10-CM | POA: Diagnosis not present

## 2017-05-10 DIAGNOSIS — F329 Major depressive disorder, single episode, unspecified: Secondary | ICD-10-CM

## 2017-05-10 LAB — COMPREHENSIVE METABOLIC PANEL
ALT: 11 U/L (ref 0–55)
AST: 18 U/L (ref 5–34)
Albumin: 4.1 g/dL (ref 3.5–5.0)
Alkaline Phosphatase: 82 U/L (ref 40–150)
Anion Gap: 6 mEq/L (ref 3–11)
BUN: 13.7 mg/dL (ref 7.0–26.0)
CALCIUM: 9.4 mg/dL (ref 8.4–10.4)
CHLORIDE: 105 meq/L (ref 98–109)
CO2: 29 mEq/L (ref 22–29)
CREATININE: 0.8 mg/dL (ref 0.6–1.1)
EGFR: >60 mL/min/{1.73_m2} (ref >60)
GLUCOSE: 107 mg/dL (ref 70–140)
Potassium: 4 mEq/L (ref 3.5–5.1)
SODIUM: 140 meq/L (ref 136–145)
Total Bilirubin: 0.41 mg/dL (ref 0.20–1.20)
Total Protein: 7.4 g/dL (ref 6.4–8.3)

## 2017-05-10 LAB — CBC WITH DIFFERENTIAL/PLATELET
BASO%: 0.3 % (ref 0.0–2.0)
Basophils Absolute: 0 10*3/uL (ref 0.0–0.1)
EOS%: 7.6 % — AB (ref 0.0–7.0)
Eosinophils Absolute: 0.4 10*3/uL (ref 0.0–0.5)
HEMATOCRIT: 42 % (ref 34.8–46.6)
HEMOGLOBIN: 14 g/dL (ref 11.6–15.9)
LYMPH#: 1.7 10*3/uL (ref 0.9–3.3)
LYMPH%: 30.1 % (ref 14.0–49.7)
MCH: 30.5 pg (ref 25.1–34.0)
MCHC: 33.3 g/dL (ref 31.5–36.0)
MCV: 91.5 fL (ref 79.5–101.0)
MONO#: 0.6 10*3/uL (ref 0.1–0.9)
MONO%: 10.2 % (ref 0.0–14.0)
NEUT#: 2.9 10*3/uL (ref 1.5–6.5)
NEUT%: 51.8 % (ref 38.4–76.8)
Platelets: 232 10*3/uL (ref 145–400)
RBC: 4.59 10*6/uL (ref 3.70–5.45)
RDW: 12.5 % (ref 11.2–14.5)
WBC: 5.6 10*3/uL (ref 3.9–10.3)

## 2017-05-12 ENCOUNTER — Telehealth: Payer: Self-pay | Admitting: Hematology

## 2017-05-12 NOTE — Telephone Encounter (Signed)
Called patient regarding current schedule °

## 2017-05-18 ENCOUNTER — Ambulatory Visit
Admission: RE | Admit: 2017-05-18 | Discharge: 2017-05-18 | Disposition: A | Discharge status: Home / Self Care | Payer: BLUE CROSS/BLUE SHIELD | Source: Ambulatory Visit | Attending: Radiation Oncology | Admitting: Radiation Oncology

## 2017-05-18 ENCOUNTER — Ambulatory Visit: Payer: BLUE CROSS/BLUE SHIELD

## 2017-05-18 DIAGNOSIS — C50412 Malignant neoplasm of upper-outer quadrant of left female breast: Secondary | ICD-10-CM

## 2017-05-18 DIAGNOSIS — Z170 Estrogen receptor positive status [ER+]: Principal | ICD-10-CM

## 2017-05-19 ENCOUNTER — Ambulatory Visit: Payer: Self-pay | Admitting: General Surgery

## 2017-05-24 ENCOUNTER — Telehealth: Payer: Self-pay

## 2017-05-24 ENCOUNTER — Telehealth: Payer: Self-pay | Admitting: Radiation Oncology

## 2017-05-24 NOTE — Telephone Encounter (Signed)
I have left a message on 05/18/2017, 05/19/2017 and again today to reschedule patient's missed F/up New appointment that was scheduled with Shona Simpson on 05/18/2017 @ 8am. I have not received a return call from the patient.

## 2017-05-24 NOTE — Telephone Encounter (Signed)
Pt called to cancel 12/12 appt. She has surgery that AM with Dr Excell Seltzer. She will call after surgery to r/s.

## 2017-05-24 NOTE — Telephone Encounter (Signed)
OK, I will reschedule for her  Truitt Merle MD

## 2017-05-28 ENCOUNTER — Telehealth: Payer: Self-pay | Admitting: Hematology

## 2017-05-28 NOTE — Telephone Encounter (Signed)
Left message for patient regarding upcoming appointments per 11/26 sch message

## 2017-06-04 ENCOUNTER — Other Ambulatory Visit: Payer: Self-pay

## 2017-06-04 ENCOUNTER — Encounter (HOSPITAL_BASED_OUTPATIENT_CLINIC_OR_DEPARTMENT_OTHER): Payer: Self-pay | Admitting: *Deleted

## 2017-06-08 NOTE — H&P (Signed)
History of Present Illness Angela Kitchen T. Elisabella Hacker MD; 05/19/2017 5:10 PM) The patient is a 61 year old female who presents with breast cancer. She returns following reexcision of her left breast lumpectomy medial and superior margins.  She is status post left breast lumpectomy and targeted left axillary sentinel lymph node biopsy after completion of neoadjuvant chemotherapy.  Her original presentation was as follows:  She is a postmenopausal female referred by Dr. Dorise Bullion for evaluation of recently diagnosed carcinoma of the left breast. The patient was recently able to feel a mass in her upper left breast. She presented to her primary physician for evaluation and was referred to the breast Center for workup. Subsequent imaging included diagnostic mamogram showing an irregular mass in the upper-outer quadrant of the left breast associated with microcalcifications corresponding to the palpable lump and an additional mass in the lower left axilla likely lymph node and ultrasound showing 2 adjacent masses in the left breast at the 1 o'clock position 3 cm from the nipple measuring 2.1 and 2.2 cm respectively and overall measuring 5.1 cm in extent as well as a 1.1 cm mass in the low left axilla corresponding to the likely enlarged lymph node.. 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 She is seen now in the office for initial treatment planning. She has experienced a mass but no other symptoms such as skin changes or pain or nipple discharge. She does not have a personal history of any previous breast problems.  Findings at that time were the following: Tumor size: 2.1 and 2.2 cm adjacent with total 5.1 cm extent of disease  Tumor grade: 2 Estrogen Receptor: 100% positive Progesterone  Receptor: Negative Her-2 neu: Negative Lymph node status: Positive   Preoperative MRI showed the 2 adjacent masses in the upper outer left breast with significant reduction in size with the larger measuring 1.1 cm and just anterior and superior to this is a 0.7 cm mass. No axillary adenopathy seen on MRI.  Her original pathology showed the targeted left axillary lymph node actually histologically appeared to be a primary breast cancer. A total of 7 sentinel lymph nodes were removed with one being positive. Her breast pathology showed a solid papillary carcinoma 1.6 cm with medial margin involved by carcinoma. Posterior margin also reported as involved but I removed the pectoralis fascia with no evidence of muscle involvement.  At the time of reexcision her medial margin was negative but there was some firm tissue along the superior margin which I took as well and this unfortunately has shown a 7 mm area of invasive ductal carcinoma with the superior margin still positive.    Problem List/Past Medical Angela Kitchen T. Orin Eberwein, MD; 05/19/2017 5:14 PM) STAGE II BREAST CANCER, LEFT (T62.563)   Past Surgical History Angela Kitchen T. Shajuana Mclucas, MD; 05/19/2017 5:14 PM) Breast Biopsy  Left. Thyroid Surgery   Diagnostic Studies History Angela Kitchen T. Jaquala Fuller, MD; 05/19/2017 5:14 PM) Colonoscopy  never Mammogram  >3 years ago Pap Smear  1-5 years ago  Allergies Angela Kitchen T. Oskar Cretella, MD; 05/19/2017 5:14 PM) Morphine Derivatives  Rash.  Medication History Angela Kitchen T. Dalaya Suppa, MD; 05/19/2017 5:14 PM) Anastrozole (1MG Tablet, Oral) Active. Armour Thyroid (15MG Tablet, Oral) Active. Omega 3 (1000MG Capsule, Oral) Active.  Social History Angela Kitchen T. Woodward Klem, MD; 05/19/2017 5:14 PM) Alcohol use  Moderate alcohol use. Caffeine use  Coffee. Tobacco use  Never smoker.  Family History Angela Kitchen T. Tyrihanna Wingert, MD; 05/19/2017 5:14 PM) Alcohol Abuse  Father, Sister. Breast Cancer   Mother. Depression  Father, Mother, Sister. Heart Disease  Family Members In General. Thyroid problems  Sister.  Pregnancy / Birth History Angela Kitchen T. Maritsa Hunsucker, MD; 05/19/2017 5:14 PM) Age at menarche  38 years. Age of menopause  19-58 Contraceptive History  Oral contraceptives. Gravida  1 Maternal age  65-40 Para  0  Other Problems Angela Kitchen T. Ivyonna Hoelzel, MD; 05/19/2017 5:14 PM) Anxiety Disorder  Breast Cancer  Depression  Thyroid Disease   Vitals (Janette Ranson CMA; 05/19/2017 4:50 PM) 05/19/2017 4:49 PM Weight: 134 lb Height: 63in Body Surface Area: 1.63 m Body Mass Index: 23.74 kg/m  Pulse: 84 (Regular)  BP: 100/70 (Sitting, Left Arm, Standard)       Physical Exam Angela Kitchen T. Shyla Gayheart MD; 05/19/2017 5:11 PM) The physical exam findings are as follows: Note:General: Appears well no distress Lungs: Clear easy respirations Cardiac: Regular rate and rhythm Breasts: Nicely healing lumpectomy and axillary incisions. No significant deformity. No complication factors. No palpable masses.    Assessment & Plan Angela Kitchen T. Khali Albanese MD; 05/19/2017 5:13 PM) STAGE II BREAST CANCER, LEFT (M07.680) Impression: 61 year old female with a diagnosis of cancer of the left breast, upper outer quadrant. Clinical stage 2A were to be if her tumor is classified as a single 5 cm mass, ER positive, PR negative, HER-2 negative. She has had a very good response to neoadjuvant chemotherapy with resolution of her abnormal left axillary lymph node and significant shrinkage of the 2 adjacent masses in the upper outer quadrant of the left breast. Subsequent lumpectomy and targeted axillary dissection revealed actually was thought to be a low axillary lymph node was a separate breast mass. Origins were not obtained on this as we thought it was a lymph node but review seems to indicate it was located centrally within the tissue. She has a positive medial and posterior margin on  her breast mass. I'm not worried about the posterior margin as we took pectoralis fascia but her medial margin is involved. We have discussed mastectomy at that time but she preferred reexcision.  Reexcision unfortunately has shown a positive superior margin. This may be a portion of the 2 adjacent tumors she originally had. I again told her that I think standard therapy at this point would be total mastectomy. She is dead set against this. I do not think it is totally unreasonable. I told her that we may not be able to obtain a negative margin and if she has one more reexcision with a positive margin I think mastectomy would be mandatory. I think she understands all these issues and is firm in her decision. Current Plans Schedule for Surgery  Reexcision left breast lumpectomy superior margin under general anesthesia as an outpatient

## 2017-06-09 ENCOUNTER — Encounter (HOSPITAL_BASED_OUTPATIENT_CLINIC_OR_DEPARTMENT_OTHER): Payer: Self-pay

## 2017-06-09 ENCOUNTER — Ambulatory Visit: Payer: BLUE CROSS/BLUE SHIELD | Admitting: Hematology

## 2017-06-09 ENCOUNTER — Ambulatory Visit (HOSPITAL_BASED_OUTPATIENT_CLINIC_OR_DEPARTMENT_OTHER)
Admission: RE | Admit: 2017-06-09 | Discharge: 2017-06-09 | Disposition: A | Discharge status: Home / Self Care | Payer: BLUE CROSS/BLUE SHIELD | Source: Ambulatory Visit | Attending: General Surgery | Admitting: General Surgery

## 2017-06-09 ENCOUNTER — Ambulatory Visit (HOSPITAL_BASED_OUTPATIENT_CLINIC_OR_DEPARTMENT_OTHER): Payer: BLUE CROSS/BLUE SHIELD | Admitting: Anesthesiology

## 2017-06-09 ENCOUNTER — Other Ambulatory Visit: Payer: Self-pay

## 2017-06-09 ENCOUNTER — Encounter (HOSPITAL_BASED_OUTPATIENT_CLINIC_OR_DEPARTMENT_OTHER)
Admission: RE | Disposition: A | Discharge status: Home / Self Care | Payer: Self-pay | Source: Ambulatory Visit | Attending: General Surgery

## 2017-06-09 DIAGNOSIS — E039 Hypothyroidism, unspecified: Secondary | ICD-10-CM | POA: Insufficient documentation

## 2017-06-09 DIAGNOSIS — F419 Anxiety disorder, unspecified: Secondary | ICD-10-CM | POA: Diagnosis not present

## 2017-06-09 DIAGNOSIS — F329 Major depressive disorder, single episode, unspecified: Secondary | ICD-10-CM | POA: Insufficient documentation

## 2017-06-09 DIAGNOSIS — Z79899 Other long term (current) drug therapy: Secondary | ICD-10-CM | POA: Diagnosis not present

## 2017-06-09 DIAGNOSIS — Z818 Family history of other mental and behavioral disorders: Secondary | ICD-10-CM | POA: Diagnosis not present

## 2017-06-09 DIAGNOSIS — Z9221 Personal history of antineoplastic chemotherapy: Secondary | ICD-10-CM | POA: Insufficient documentation

## 2017-06-09 DIAGNOSIS — N61 Mastitis without abscess: Secondary | ICD-10-CM | POA: Diagnosis not present

## 2017-06-09 DIAGNOSIS — Z8349 Family history of other endocrine, nutritional and metabolic diseases: Secondary | ICD-10-CM | POA: Insufficient documentation

## 2017-06-09 DIAGNOSIS — Z170 Estrogen receptor positive status [ER+]: Secondary | ICD-10-CM | POA: Insufficient documentation

## 2017-06-09 DIAGNOSIS — C50912 Malignant neoplasm of unspecified site of left female breast: Secondary | ICD-10-CM | POA: Diagnosis not present

## 2017-06-09 DIAGNOSIS — Z8249 Family history of ischemic heart disease and other diseases of the circulatory system: Secondary | ICD-10-CM | POA: Insufficient documentation

## 2017-06-09 DIAGNOSIS — Z803 Family history of malignant neoplasm of breast: Secondary | ICD-10-CM | POA: Diagnosis not present

## 2017-06-09 DIAGNOSIS — Z811 Family history of alcohol abuse and dependence: Secondary | ICD-10-CM | POA: Insufficient documentation

## 2017-06-09 DIAGNOSIS — Z885 Allergy status to narcotic agent status: Secondary | ICD-10-CM | POA: Diagnosis not present

## 2017-06-09 DIAGNOSIS — N6032 Fibrosclerosis of left breast: Secondary | ICD-10-CM | POA: Diagnosis not present

## 2017-06-09 DIAGNOSIS — C50412 Malignant neoplasm of upper-outer quadrant of left female breast: Secondary | ICD-10-CM | POA: Insufficient documentation

## 2017-06-09 HISTORY — DX: Nausea with vomiting, unspecified: R11.2

## 2017-06-09 HISTORY — DX: Other specified postprocedural states: Z98.890

## 2017-06-09 HISTORY — DX: Other complications of anesthesia, initial encounter: T88.59XA

## 2017-06-09 HISTORY — DX: Adverse effect of unspecified anesthetic, initial encounter: T41.45XA

## 2017-06-09 HISTORY — PX: RE-EXCISION OF BREAST LUMPECTOMY: SHX6048

## 2017-06-09 SURGERY — EXCISION, LESION, BREAST
Anesthesia: General | Site: Breast | Laterality: Left

## 2017-06-09 MED ORDER — 0.9 % SODIUM CHLORIDE (POUR BTL) OPTIME
TOPICAL | Status: DC | PRN
  Administered 2017-06-09: 150 mL

## 2017-06-09 MED ORDER — EPHEDRINE 5 MG/ML INJ
INTRAVENOUS | Status: AC
  Filled 2017-06-09: qty 10

## 2017-06-09 MED ORDER — ONDANSETRON HCL 4 MG/2ML IJ SOLN
INTRAMUSCULAR | Status: AC
  Filled 2017-06-09: qty 2

## 2017-06-09 MED ORDER — GABAPENTIN 300 MG PO CAPS
300.0000 mg | ORAL_CAPSULE | ORAL | Status: AC
  Administered 2017-06-09: 300 mg via ORAL

## 2017-06-09 MED ORDER — CHLORHEXIDINE GLUCONATE CLOTH 2 % EX PADS: 6.0000 | MEDICATED_PAD | Freq: Once | CUTANEOUS | Status: DC

## 2017-06-09 MED ORDER — LIDOCAINE 2% (20 MG/ML) 5 ML SYRINGE
INTRAMUSCULAR | Status: AC
  Filled 2017-06-09: qty 5

## 2017-06-09 MED ORDER — CELECOXIB 200 MG PO CAPS
ORAL_CAPSULE | ORAL | Status: AC
  Filled 2017-06-09: qty 1

## 2017-06-09 MED ORDER — FENTANYL CITRATE (PF) 100 MCG/2ML IJ SOLN
25.0000 ug | INTRAMUSCULAR | Status: DC | PRN
  Administered 2017-06-09 (×2): 25 ug via INTRAVENOUS

## 2017-06-09 MED ORDER — SUCCINYLCHOLINE CHLORIDE 200 MG/10ML IV SOSY
PREFILLED_SYRINGE | INTRAVENOUS | Status: AC
  Filled 2017-06-09: qty 10

## 2017-06-09 MED ORDER — MIDAZOLAM HCL 2 MG/2ML IJ SOLN
1.0000 mg | INTRAMUSCULAR | Status: DC | PRN
  Administered 2017-06-09: 2 mg via INTRAVENOUS

## 2017-06-09 MED ORDER — ONDANSETRON HCL 4 MG/2ML IJ SOLN
INTRAMUSCULAR | Status: DC | PRN
  Administered 2017-06-09: 4 mg via INTRAVENOUS

## 2017-06-09 MED ORDER — DEXAMETHASONE SODIUM PHOSPHATE 4 MG/ML IJ SOLN
INTRAMUSCULAR | Status: DC | PRN
  Administered 2017-06-09: 10 mg via INTRAVENOUS

## 2017-06-09 MED ORDER — SCOPOLAMINE 1 MG/3DAYS TD PT72
MEDICATED_PATCH | TRANSDERMAL | Status: AC
  Filled 2017-06-09: qty 1

## 2017-06-09 MED ORDER — SCOPOLAMINE 1 MG/3DAYS TD PT72
1.0000 | MEDICATED_PATCH | Freq: Once | TRANSDERMAL | Status: DC | PRN
  Administered 2017-06-09: 1.5 mg via TRANSDERMAL

## 2017-06-09 MED ORDER — PROPOFOL 500 MG/50ML IV EMUL
INTRAVENOUS | Status: AC
  Filled 2017-06-09: qty 50

## 2017-06-09 MED ORDER — FENTANYL CITRATE (PF) 100 MCG/2ML IJ SOLN
INTRAMUSCULAR | Status: AC
  Filled 2017-06-09: qty 2

## 2017-06-09 MED ORDER — PROPOFOL 10 MG/ML IV BOLUS
INTRAVENOUS | Status: DC | PRN
  Administered 2017-06-09: 150 mg via INTRAVENOUS

## 2017-06-09 MED ORDER — TRAMADOL HCL 50 MG PO TABS: 50.0000 mg | ORAL_TABLET | Freq: Four times a day (QID) | ORAL | 1 refills | Status: DC | PRN

## 2017-06-09 MED ORDER — CEFAZOLIN SODIUM-DEXTROSE 2-4 GM/100ML-% IV SOLN
INTRAVENOUS | Status: AC
  Filled 2017-06-09: qty 100

## 2017-06-09 MED ORDER — MIDAZOLAM HCL 2 MG/2ML IJ SOLN
INTRAMUSCULAR | Status: AC
  Filled 2017-06-09: qty 2

## 2017-06-09 MED ORDER — FENTANYL CITRATE (PF) 100 MCG/2ML IJ SOLN
50.0000 ug | INTRAMUSCULAR | Status: DC | PRN
  Administered 2017-06-09: 100 ug via INTRAVENOUS

## 2017-06-09 MED ORDER — PROPOFOL 500 MG/50ML IV EMUL
INTRAVENOUS | Status: DC | PRN
  Administered 2017-06-09: 125 ug/kg/min via INTRAVENOUS

## 2017-06-09 MED ORDER — BUPIVACAINE-EPINEPHRINE (PF) 0.5% -1:200000 IJ SOLN
INTRAMUSCULAR | Status: AC
  Filled 2017-06-09: qty 30

## 2017-06-09 MED ORDER — CEFAZOLIN SODIUM-DEXTROSE 2-4 GM/100ML-% IV SOLN
2.0000 g | INTRAVENOUS | Status: AC
  Administered 2017-06-09: 2 g via INTRAVENOUS

## 2017-06-09 MED ORDER — GABAPENTIN 300 MG PO CAPS
ORAL_CAPSULE | ORAL | Status: AC
  Filled 2017-06-09: qty 1

## 2017-06-09 MED ORDER — PHENYLEPHRINE 40 MCG/ML (10ML) SYRINGE FOR IV PUSH (FOR BLOOD PRESSURE SUPPORT)
PREFILLED_SYRINGE | INTRAVENOUS | Status: AC
  Filled 2017-06-09: qty 10

## 2017-06-09 MED ORDER — BUPIVACAINE-EPINEPHRINE 0.5% -1:200000 IJ SOLN
INTRAMUSCULAR | Status: DC | PRN
  Administered 2017-06-09: 20 mL

## 2017-06-09 MED ORDER — DEXAMETHASONE SODIUM PHOSPHATE 10 MG/ML IJ SOLN
INTRAMUSCULAR | Status: AC
  Filled 2017-06-09: qty 1

## 2017-06-09 MED ORDER — CELECOXIB 200 MG PO CAPS
200.0000 mg | ORAL_CAPSULE | ORAL | Status: AC
  Administered 2017-06-09: 200 mg via ORAL

## 2017-06-09 MED ORDER — ACETAMINOPHEN 500 MG PO TABS
ORAL_TABLET | ORAL | Status: AC
  Filled 2017-06-09: qty 2

## 2017-06-09 MED ORDER — LIDOCAINE 2% (20 MG/ML) 5 ML SYRINGE
INTRAMUSCULAR | Status: DC | PRN
  Administered 2017-06-09: 50 mg via INTRAVENOUS

## 2017-06-09 MED ORDER — ACETAMINOPHEN 500 MG PO TABS
1000.0000 mg | ORAL_TABLET | ORAL | Status: AC
Start: 2017-06-09 — End: 2017-06-09
  Administered 2017-06-09: 1000 mg via ORAL

## 2017-06-09 MED ORDER — LACTATED RINGERS IV SOLN
INTRAVENOUS | Status: DC
  Administered 2017-06-09 (×2): via INTRAVENOUS

## 2017-06-09 MED ORDER — DIPHENHYDRAMINE HCL 50 MG/ML IJ SOLN
INTRAMUSCULAR | Status: DC | PRN
  Administered 2017-06-09: 12.5 mg via INTRAVENOUS

## 2017-06-09 SURGICAL SUPPLY — 44 items
ADH SKN CLS APL DERMABOND .7 (GAUZE/BANDAGES/DRESSINGS) ×1
BINDER BREAST MEDIUM (GAUZE/BANDAGES/DRESSINGS) ×1 IMPLANT
BLADE SURG 15 STRL LF DISP TIS (BLADE) ×1 IMPLANT
BLADE SURG 15 STRL SS (BLADE) ×2
CANISTER SUCT 1200ML W/VALVE (MISCELLANEOUS) ×2 IMPLANT
CHLORAPREP W/TINT 26ML (MISCELLANEOUS) ×2 IMPLANT
CLIP VESOCCLUDE SM WIDE 6/CT (CLIP) ×1 IMPLANT
COVER BACK TABLE 60X90IN (DRAPES) ×2 IMPLANT
COVER MAYO STAND STRL (DRAPES) ×2 IMPLANT
DERMABOND ADVANCED (GAUZE/BANDAGES/DRESSINGS) ×1
DERMABOND ADVANCED .7 DNX12 (GAUZE/BANDAGES/DRESSINGS) ×1 IMPLANT
DEVICE DUBIN W/COMP PLATE 8390 (MISCELLANEOUS) IMPLANT
DRAPE LAPAROSCOPIC ABDOMINAL (DRAPES) ×2 IMPLANT
DRAPE UTILITY XL STRL (DRAPES) ×2 IMPLANT
ELECT COATED BLADE 2.86 ST (ELECTRODE) ×2 IMPLANT
ELECT REM PT RETURN 9FT ADLT (ELECTROSURGICAL) ×2
ELECTRODE REM PT RTRN 9FT ADLT (ELECTROSURGICAL) ×1 IMPLANT
GLOVE BIO SURGEON STRL SZ7 (GLOVE) ×1 IMPLANT
GLOVE BIOGEL PI IND STRL 6.5 (GLOVE) IMPLANT
GLOVE BIOGEL PI IND STRL 8 (GLOVE) ×1 IMPLANT
GLOVE BIOGEL PI INDICATOR 6.5 (GLOVE) ×1
GLOVE BIOGEL PI INDICATOR 8 (GLOVE) ×1
GLOVE ECLIPSE 6.5 STRL STRAW (GLOVE) ×1 IMPLANT
GLOVE ECLIPSE 7.5 STRL STRAW (GLOVE) ×2 IMPLANT
GOWN STRL REUS W/ TWL LRG LVL3 (GOWN DISPOSABLE) ×1 IMPLANT
GOWN STRL REUS W/ TWL XL LVL3 (GOWN DISPOSABLE) ×1 IMPLANT
GOWN STRL REUS W/TWL LRG LVL3 (GOWN DISPOSABLE) ×2
GOWN STRL REUS W/TWL XL LVL3 (GOWN DISPOSABLE) ×4
KIT MARKER MARGIN INK (KITS) ×1 IMPLANT
NDL HYPO 25X1 1.5 SAFETY (NEEDLE) ×1 IMPLANT
NEEDLE HYPO 25X1 1.5 SAFETY (NEEDLE) ×2 IMPLANT
NS IRRIG 1000ML POUR BTL (IV SOLUTION) ×1 IMPLANT
PACK BASIN DAY SURGERY FS (CUSTOM PROCEDURE TRAY) ×2 IMPLANT
PENCIL BUTTON HOLSTER BLD 10FT (ELECTRODE) ×2 IMPLANT
SLEEVE SCD COMPRESS KNEE MED (MISCELLANEOUS) ×2 IMPLANT
SPONGE LAP 4X18 X RAY DECT (DISPOSABLE) ×2 IMPLANT
SUT MON AB 5-0 PS2 18 (SUTURE) ×2 IMPLANT
SUT VICRYL 3-0 CR8 SH (SUTURE) ×2 IMPLANT
SYR BULB 3OZ (MISCELLANEOUS) ×2 IMPLANT
SYR CONTROL 10ML LL (SYRINGE) ×2 IMPLANT
TOWEL OR 17X24 6PK STRL BLUE (TOWEL DISPOSABLE) ×2 IMPLANT
TOWEL OR NON WOVEN STRL DISP B (DISPOSABLE) ×2 IMPLANT
TUBE CONNECTING 20X1/4 (TUBING) ×2 IMPLANT
YANKAUER SUCT BULB TIP NO VENT (SUCTIONS) ×2 IMPLANT

## 2017-06-09 NOTE — Anesthesia Postprocedure Evaluation (Signed)
Anesthesia Post Note  Patient: Angela [name]  Procedure(s) Performed: RE-EXCISION OF BREAST LUMPECTOMY (Left Breast)     Patient location during evaluation: PACU Anesthesia Type: General Level of consciousness: awake and alert Pain management: pain level controlled Vital Signs Assessment: post-procedure vital signs reviewed and stable Respiratory status: spontaneous breathing, nonlabored ventilation and respiratory function stable Cardiovascular status: blood pressure returned to baseline and stable Postop Assessment: no apparent nausea or vomiting Anesthetic complications: no    Last Vitals:  Vitals:   06/09/17 1145 06/09/17 1207  BP:  116/84  Pulse: 89 81  Resp: 18 18  Temp:  36.6 C  SpO2: 100% 97%    Last Pain:  Vitals:   06/09/17 1207  TempSrc:   PainSc: 1                  Rontavious Albright,W. EDMOND

## 2017-06-09 NOTE — Transfer of Care (Signed)
Immediate Anesthesia Transfer of Care Note  Patient: Angela [name]  Procedure(s) Performed: RE-EXCISION OF BREAST LUMPECTOMY (Left Breast)  Patient Location: PACU  Anesthesia Type:General  Level of Consciousness: awake, alert , oriented and drowsy  Airway & Oxygen Therapy: Patient Spontanous Breathing and Patient connected to face mask oxygen  Post-op Assessment: Report given to RN and Post -op Vital signs reviewed and stable  Post vital signs: Reviewed and stable  Last Vitals:  Vitals:   06/09/17 0835 06/09/17 1049  BP: 107/75 102/72  Pulse: 83 95  Resp: 18   Temp: 36.7 C   SpO2: 98% 100%    Last Pain:  Vitals:   06/09/17 0835  TempSrc: Oral         Complications: No apparent anesthesia complications

## 2017-06-09 NOTE — Op Note (Signed)
Preoperative Diagnosis: LEFT BREAST CANCER  Postoprative Diagnosis: LEFT BREAST CANCER  Procedure: Procedure(s): RE-EXCISION OF BREAST LUMPECTOMY   Surgeon: Excell Seltzer T   Assistants: None  Anesthesia:  General LMA anesthesia  Indications: Patient presented approximately 6 months ago with 2 adjacent masses in the upper outer left breast measuring a total of approximately 5 cm.  She underwent neoadjuvant chemotherapy with reduction of her tumors to 2 adjacent masses measuring approximately 1 and 0.7 cm encompassing a total of just over 2 cm.  Patient strongly desired breast conservation and underwent lumpectomy initially about 7 weeks ago with a positive medial margin.  We reexcised her medial margin which was negative but I also excised a firm area on the superior margin at that time which was positive for 0.7 cm area of invasive tumor focally positive on the superior margin.  I have counseled the patient and offered mastectomy which I feel would be the most reasonable choice but she strongly desires breast conservation and if we could achieve a negative margin with reexcision I do not think this is unreasonable.  After extensive discussion we therefore plan to re-excise the superior margin of her left breast lumpectomy.    Procedure Detail: Patient was brought to the operating room, placed in supine position on the operating table, and laryngeal mask general anesthesia induced.  The left breast was widely sterilely prepped and draped.  Patient timeout was performed and correct procedure verified.  She had received preoperative IV antibiotics.  The previous curvilinear incision in the upper outer left breast was used and dissection was carried down through the simultaneous tissue.  It is been several weeks since her original lumpectomy and the cavity had essentially healed.  Using cautery I dissected down through indurated and early scar tissue through the thin breast tissue down to the  chest wall.  By inspection and palpation this appeared to be the center of the previous lumpectomy.  The entire superior margin was exposed and then I excised the entire superior margin from deep subcutaneous tissue down to the chest wall going an additional approximately 0.5 cm.  I did identify a clip in this area.  This was inked.  Inspection of the superior margin revealed one further area centrally in the superior margin that was firm possibly some scarring from the previous surgery and I did an additional further excision of the superior margin in this area back to soft normal-appearing breast tissue again going from deep subcu to chest wall with a very thin breast in this area.  This additional margin was then inked and sent as additional superior margin.  There were no palpable distinct abnormalities in the breast tissue at this point other than postoperative change.  The wound was irrigated and hemostasis obtained.  The lumpectomy cavity was closed as much possible with interrupted 3-0 Vicryl.  Subcu was closed with interrupted 3-0 Vicryl and skin with running subcuticular 5-0 Monocryl and Dermabond.  Sponge needle and instrument counts were correct.    Findings: As above  Estimated Blood Loss:  Minimal         Drains: None  Blood Given: none          Specimens: #1 reexcision left breast lumpectomy superior margin, oriented with ink                       #2 further central superior margin oriented with ink        Complications:  * No complications  entered in OR log *         Disposition: PACU - hemodynamically stable.         Condition: stable

## 2017-06-09 NOTE — Interval H&P Note (Signed)
History and Physical Interval Note:  06/09/2017 9:32 AM  Angela [name]  has presented today for surgery, with the diagnosis of LEFT BREAST CANCER  The various methods of treatment have been discussed with the patient and family. After consideration of risks, benefits and other options for treatment, the patient has consented to  Procedure(s): RE-EXCISION OF BREAST LUMPECTOMY (Left) as a surgical intervention .  The patient's history has been reviewed, patient examined, no change in status, stable for surgery.  I have reviewed the patient's chart and labs.  Questions were answered to the patient's satisfaction.     Darene Lamer Mahin Guardia

## 2017-06-09 NOTE — Discharge Instructions (Signed)
[facility] °Office Phone Number [phone] ° °BREAST BIOPSY/ PARTIAL MASTECTOMY: POST OP INSTRUCTIONS ° °Always review your discharge instruction sheet given to you by the facility where your surgery was performed. ° °IF YOU HAVE DISABILITY OR FAMILY LEAVE FORMS, YOU MUST BRING THEM TO THE OFFICE FOR PROCESSING.  DO NOT GIVE THEM TO YOUR DOCTOR. ° °1. A prescription for pain medication may be given to you upon discharge.  Take your pain medication as prescribed, if needed.  If narcotic pain medicine is not needed, then you may take acetaminophen (Tylenol) or ibuprofen (Advil) as needed. °2. Take your usually prescribed medications unless otherwise directed °3. If you need a refill on your pain medication, please contact your pharmacy.  They will contact our office to request authorization.  Prescriptions will not be filled after 5pm or on week-ends. °4. You should eat very light the first 24 hours after surgery, such as soup, crackers, pudding, etc.  Resume your normal diet the day after surgery. °5. Most patients will experience some swelling and bruising in the breast.  Ice packs and a good support bra will help.  Swelling and bruising can take several days to resolve.  °6. It is common to experience some constipation if taking pain medication after surgery.  Increasing fluid intake and taking a stool softener will usually help or prevent this problem from occurring.  A mild laxative (Milk of Magnesia or Miralax) should be taken according to package directions if there are no bowel movements after 48 hours. °7. Unless discharge instructions indicate otherwise, you may remove your bandages 24-48 hours after surgery, and you may shower at that time.  You may have steri-strips (small skin tapes) in place directly over the incision.  These strips should be left on the skin for 7-10 days.  If your surgeon used skin glue on the incision, you may shower in 24 hours.  The glue will flake off over the  next 2-3 weeks.  Any sutures or staples will be removed at the office during your follow-up visit. °8. ACTIVITIES:  You may resume regular daily activities (gradually increasing) beginning the next day.  Wearing a good support bra or sports bra minimizes pain and swelling.  You may have sexual intercourse when it is comfortable. °a. You may drive when you no longer are taking prescription pain medication, you can comfortably wear a seatbelt, and you can safely maneuver your car and apply brakes. °b. RETURN TO WORK:  ______________________________________________________________________________________ °9. You should see your doctor in the office for a follow-up appointment approximately two weeks after your surgery.  Your doctor’s nurse will typically make your follow-up appointment when she calls you with your pathology report.  Expect your pathology report 2-3 business days after your surgery.  You may call to check if you do not hear from us after three days. °10. OTHER INSTRUCTIONS: _______________________________________________________________________________________________ _____________________________________________________________________________________________________________________________________ °_____________________________________________________________________________________________________________________________________ °_____________________________________________________________________________________________________________________________________ ° °WHEN TO CALL YOUR DOCTOR: °1. Fever over 101.0 °2. Nausea and/or vomiting. °3. Extreme swelling or bruising. °4. Continued bleeding from incision. °5. Increased pain, redness, or drainage from the incision. ° °The clinic staff is available to answer your questions during regular business hours.  Please don’t hesitate to call and ask to speak to one of the nurses for clinical concerns.  If you have a medical emergency, go to the nearest  emergency room or call 911.  A surgeon from [facility] is always on call at the hospital. ° °For further questions, please visit [email or web address]  ° ° ° °  Post Anesthesia Home Care Instructions  Activity: Get plenty of rest for the remainder of the day. A responsible individual must stay with you for 24 hours following the procedure.  For the next 24 hours, DO NOT: -Drive a car -Paediatric nurse -Drink alcoholic beverages -Take any medication unless instructed by your physician -Make any legal decisions or sign important papers.  Meals: Start with liquid foods such as gelatin or soup. Progress to regular foods as tolerated. Avoid greasy, spicy, heavy foods. If nausea and/or vomiting occur, drink only clear liquids until the nausea and/or vomiting subsides. Call your physician if vomiting continues.  Special Instructions/Symptoms: Your throat may feel dry or sore from the anesthesia or the breathing tube placed in your throat during surgery. If this causes discomfort, gargle with warm salt water. The discomfort should disappear within 24 hours.  If you had a scopolamine patch placed behind your ear for the management of post- operative nausea and/or vomiting:  1. The medication in the patch is effective for 72 hours, after which it should be removed.  Wrap patch in a tissue and discard in the trash. Wash hands thoroughly with soap and water. 2. You may remove the patch earlier than 72 hours if you experience unpleasant side effects which may include dry mouth, dizziness or visual disturbances. 3. Avoid touching the patch. Wash your hands with soap and water after contact with the patch.    Fentanyl 50mcg at 11:22   Fentanyl 21mcg at 11:29

## 2017-06-09 NOTE — Anesthesia Preprocedure Evaluation (Signed)
Anesthesia Evaluation  Patient identified by MRN, date of birth, ID band Patient awake    Reviewed: Allergy & Precautions, H&P , NPO status , Patient's Chart, lab work & pertinent test results  History of Anesthesia Complications (+) PONV  Airway Mallampati: II  TM Distance: >3 FB Neck ROM: Full    Dental no notable dental hx. (+) Teeth Intact, Dental Advisory Given   Pulmonary neg pulmonary ROS,    Pulmonary exam normal breath sounds clear to auscultation       Cardiovascular negative cardio ROS   Rhythm:Regular Rate:Normal     Neuro/Psych Anxiety Depression negative neurological ROS     GI/Hepatic negative GI ROS, Neg liver ROS,   Endo/Other  Hypothyroidism   Renal/GU negative Renal ROS  negative genitourinary   Musculoskeletal   Abdominal   Peds  Hematology negative hematology ROS (+)   Anesthesia Other Findings   Reproductive/Obstetrics negative OB ROS                             Anesthesia Physical Anesthesia Plan  ASA: II  Anesthesia Plan: General   Post-op Pain Management:    Induction: Intravenous  PONV Risk Score and Plan: 4 or greater and Ondansetron, Dexamethasone, TIVA, Midazolam and Diphenhydramine  Airway Management Planned: LMA  Additional Equipment:   Intra-op Plan:   Post-operative Plan: Extubation in OR  Informed Consent: I have reviewed the patients History and Physical, chart, labs and discussed the procedure including the risks, benefits and alternatives for the proposed anesthesia with the patient or authorized representative who has indicated his/her understanding and acceptance.   Dental advisory given  Plan Discussed with: CRNA  Anesthesia Plan Comments:         Anesthesia Quick Evaluation

## 2017-06-09 NOTE — Anesthesia Procedure Notes (Signed)
Procedure Name: LMA Insertion Date/Time: 06/09/2017 9:55 AM Performed by: Willa Frater, CRNA Pre-anesthesia Checklist: Patient identified, Emergency Drugs available, Suction available and Patient being monitored Patient Re-evaluated:Patient Re-evaluated prior to induction Oxygen Delivery Method: Circle system utilized Preoxygenation: Pre-oxygenation with 100% oxygen Induction Type: IV induction Ventilation: Mask ventilation without difficulty LMA: LMA inserted LMA Size: 3.0 Number of attempts: 2 Airway Equipment and Method: Bite block Placement Confirmation: positive ETCO2 Tube secured with: Tape Dental Injury: Teeth and Oropharynx as per pre-operative assessment

## 2017-06-10 ENCOUNTER — Encounter (HOSPITAL_BASED_OUTPATIENT_CLINIC_OR_DEPARTMENT_OTHER): Payer: Self-pay | Admitting: General Surgery

## 2017-06-17 NOTE — Progress Notes (Signed)
Location of Breast Cancer: Upper -Outer Quadrant of left breast female     FUN after surgery 06-09-17 Breast, excision, Left Upper Outer Superior Margin,  Breast, excision, Left Upper Outer additional Superior Margin,05-04-17 . Breast, excision, Left Medial Margin and Breast, excision, Left Superior Margin,04-06-17 left lumpectomy to move forward with the simulation and planning process.  Histology per Pathology Report:  Diagnosis 08/06/2016: Dr. Marland Kitchen [place] 1. Breast, left, needle core biopsy, 1:00 o'clock - INVASIVE DUCTAL CARCINOMA WITH PAPILLARY FEATURES. - SEE COMMENT. 2. Lymph node, needle/core biopsy, left axilla - DUCTAL CARCINOMA.  Receptor Status: ER(100%+), PR (neg), Her2-neu (neg,ratio=1.29), Ki-67(15%)  Did patient present with symptoms (if so, please note symptoms) or was this found on screening mammography?:had yearly physical, Md felt mass, sent for mammogram  Past/Anticipated interventions by surgeon, if any:  Diagnosis 06-09-17 Dr. Excell Seltzer 1. Breast, excision, Left Upper Outer Superior Margin - FIBROSIS, INFLAMMATION AND GIANT CELL REACTION CONSISTENT WITH PREVIOUS LUMPECTOMY. - NO RESIDUAL CARCINOMA IDENTIFIED. - FINAL MARGIN CLEAR. 2. Breast, excision, Left Upper Outer additional Superior Margin - INFLAMMATION, FIBROSIS AND GIANT CELL REACTION CONSISTENT WITH PREVIOUS LUMPECTOMY. - NO RESIDUAL CARCINOMA IDENTIFIED. - FINAL MARGIN CLEAR.  Diagnosis 05-04-17 Dr. Marland Kitchen [place] 1. Breast, excision, Left Medial Margin - FIBROSIS, INFLAMMATION AND GIANT CELLS CONSISTENT WITH THE PREVIOUS BIOPSY SITE. - NO MALIGNANCY IDENTIFIED. - FINAL MEDIAL MARGIN CLEAR. 2. Breast, excision, Left Superior Margin - INVASIVE DUCTAL CARCINOMA, 0.7 CM. MSBR GRADE 3. - CARCINOMA FOCALLY INVOLVES SUPERIOR MARGIN. - FIBROSIS, INFLAMMATION AND GIANT CELLS CONSISTENT WITH PREVIOUS BIOPSY SITE.   Diagnosis 04-06-17  Dr. Marland Kitchen [place] 1. Breast, lumpectomy,  Left - SOLID PAPILLARY CARCINOMA, 1.6 CM - POSTERIOR AND MEDIAL MARGINS INVOLVED BY CARCINOMA - PREVIOUS BIOPSY SITE CHANGES - SEE ONCOLOGY TABLE BELOW AND COMMENT BELOW 2. Lymph node, sentinel, biopsy, Left axilla - INVASIVE DUCTAL CARCINOMA, NOTTINGHAM GRADE 3, SPANNING 0.5 CM - NO NODAL TISSUE IDENTIFIED - SEE ONCOLOGY TABLE AND COMMENT BELOW 3. Lymph node, sentinel, biopsy, Left axillary #1 - METASTATIC CARCINOMA WITH CALCIFICATIONS INVOLVING ONE LYMPH NODE (1/1) 4. Lymph node, sentinel, biopsy, Left axillary #2 - NO CARCINOMA IDENTIFIED IN ONE LYMPH NODE (0/1) 5. Lymph node, sentinel, biopsy, Left axillary #3 - NO CARCINOMA IDENTIFIED IN ONE LYMPH NODE (0/1) 6. Lymph node, sentinel, biopsy, Left axillary #4 - NO CARCINOMA IDENTIFIED IN ONE LYMPH NODE (0/1) 7. Lymph node, sentinel, biopsy, Left axillary #5 - NO CARCINOMA IDENTIFIED IN ONE LYMPH NODE (0/1 8. Lymph node, sentinel, biopsy, Left axillary #6 - NO CARCINOMA IDENTIFIED IN ONE LYMPH NODE (0/1) 9. Lymph node, sentinel, biopsy, Left axillary #7 - NO CARCINOMA IDENTIFIED IN ONE LYMPH NODE (0/1) 10. Breast, excision, Left additional medial margin - INVASIVE DUCTAL CARCINOMA, NOTTINGHAM GRADE 3, 0.5 CM - CARCINOMA AT INKED RESECTION MARGIN - SEE ONCOLOGY TABLE AND COMMENT BELOW  Receptor Status: ER(100%+), PR (15 %+), Her2-neu (neg,ratio=1.29), Ki-67(10 %)   Past/Anticipated interventions by medical oncology, if any: 04-20-17 Dr. Burr Medico adjuvant radiation,adjuvant aromatase inhibitor x 7 years,mammaprint: High risk, luminal type  Dr.Feng appointment scheduled for 08/20/16  Lymphedema issues, if any:  No ROM to left arm good. Skin to to left breast normal color.  Saw Dr. Excell Seltzer 06-23-17 stated she was doing well will see again June 2019. Pain issues, if any: No  SAFETY ISSUES: No  Prior radiation?No   Pacemaker/ICD? No  Possible current pregnancy? No  Is the patient on methotrexate? No  Current  Complaints / other details:  Divorced, , menarche age 73,  G1P0,, miscarriage, oral contraceptives 15 years, , depression,anxiety,moderatwe alcohol (1beers  Weekly ) ,no tobacco use ,hx marijuana  2-3 x week  Mother Breast cancer dx age 10, , double mastectomy;  Living age 40,, ,Bi-polar,  depression,Father depression,,PTSD;,  sister depression Wt Readings from Last 3 Encounters:  07/01/17 136 lb 6.4 oz (61.9 kg)  06/28/17 135 lb (61.2 kg)  06/25/17 140 lb 8 oz (63.7 kg)  BP 118/73 (BP Location: Right Arm, Patient Position: Sitting, Cuff Size: Normal)   Pulse 86   Temp 98.5 F (36.9 C) (Oral)   Resp 18   Ht 5' 4.5" (1.638 m)   Wt 136 lb 6.4 oz (61.9 kg)   SpO2 100%   BMI 23.05 kg/m

## 2017-06-17 NOTE — Progress Notes (Deleted)
Location of Breast Cancer: Upper -Outer Quadrant of left breast female     FUN after surgery 05-04-17 . Breast, excision, Left Medial Margin and Breast, excision, Left Superior Margin,04-06-17 left lumpectomy to move forward with the simulation and planning process.  Histology per Pathology Report:  Diagnosis 06-09-17  1. Breast, excision, Left Upper Outer Superior Margin - FIBROSIS, INFLAMMATION AND GIANT CELL REACTION CONSISTENT WITH PREVIOUS LUMPECTOMY. - NO RESIDUAL CARCINOMA IDENTIFIED. - FINAL MARGIN CLEAR. 2. Breast, excision, Left Upper Outer additional Superior Margin - INFLAMMATION, FIBROSIS AND GIANT CELL REACTION CONSISTENT WITH PREVIOUS LUMPECTOMY. - NO RESIDUAL CARCINOMA IDENTIFIED. - FINAL MARGIN CLEAR. Angela [name]   Diagnosis 05-04-17 1. Breast, excision, Left Medial Margin - FIBROSIS, INFLAMMATION AND GIANT CELLS CONSISTENT WITH THE PREVIOUS BIOPSY SITE. - NO MALIGNANCY IDENTIFIED. - FINAL MEDIAL MARGIN CLEAR. 2. Breast, excision, Left Superior Margin - INVASIVE DUCTAL CARCINOMA, 0.7 CM. MSBR GRADE 3. - CARCINOMA FOCALLY INVOLVES SUPERIOR MARGIN. - FIBROSIS, INFLAMMATION AND GIANT CELLS CONSISTENT WITH PREVIOUS BIOPSY SITE.   Diagnosis 04-06-17  Angela [name] 1. Breast, lumpectomy, Left - SOLID PAPILLARY CARCINOMA, 1.6 CM - POSTERIOR AND MEDIAL MARGINS INVOLVED BY CARCINOMA - PREVIOUS BIOPSY SITE CHANGES - SEE ONCOLOGY TABLE BELOW AND COMMENT BELOW 2. Lymph node, sentinel, biopsy, Left axilla - INVASIVE DUCTAL CARCINOMA, NOTTINGHAM GRADE 3, SPANNING 0.5 CM - NO NODAL TISSUE IDENTIFIED - SEE ONCOLOGY TABLE AND COMMENT BELOW 3. Lymph node, sentinel, biopsy, Left axillary #1 - METASTATIC CARCINOMA WITH CALCIFICATIONS INVOLVING ONE LYMPH NODE (1/1) 4. Lymph node, sentinel, biopsy, Left axillary #2 - NO CARCINOMA IDENTIFIED IN ONE LYMPH NODE (0/1) 5. Lymph node, sentinel, biopsy, Left axillary #3 - NO CARCINOMA IDENTIFIED IN ONE LYMPH NODE (0/1) 6. Lymph node,  sentinel, biopsy, Left axillary #4 - NO CARCINOMA IDENTIFIED IN ONE LYMPH NODE (0/1) 7. Lymph node, sentinel, biopsy, Left axillary #5 - NO CARCINOMA IDENTIFIED IN ONE LYMPH NODE (0/1 8. Lymph node, sentinel, biopsy, Left axillary #6 - NO CARCINOMA IDENTIFIED IN ONE LYMPH NODE (0/1) 9. Lymph node, sentinel, biopsy, Left axillary #7 - NO CARCINOMA IDENTIFIED IN ONE LYMPH NODE (0/1) 10. Breast, excision, Left additional medial margin - INVASIVE DUCTAL CARCINOMA, NOTTINGHAM GRADE 3, 0.5 CM - CARCINOMA AT INKED RESECTION MARGIN - SEE ONCOLOGY TABLE AND COMMENT BELOW  Receptor Status: ER(100%+), PR (15 %+), Her2-neu (neg,ratio=1.29), Ki-67(10 %)    Diagnosis 08/06/2016: Dr. Excell Seltzer, MD 1. Breast, left, needle core biopsy, 1:00 o'clock - INVASIVE DUCTAL CARCINOMA WITH PAPILLARY FEATURES. - SEE COMMENT. 2. Lymph node, needle/core biopsy, left axilla - DUCTAL CARCINOMA.  Receptor Status: ER(100%+), PR (nrg), Her2-neu (neg,ratio=1.29), Ki-67(15%)  Did patient present with symptoms (if so, please note symptoms) or was this found on screening mammography?:had yearly physical, Md felt mass, sent for mammogram  Past/Anticipated interventions by surgeon, if any:Dr. Excell [name] 05-04-17  Breast, excision, Left Medial Margin, Breast, excision, Left Superior Margin 10- 09-18 Breast, lumpectomy,Lymph node, sentinel, biopsy, Left axilla x 7, Left no surgery scheduled as yet  Past/Anticipated interventions by medical oncology, if any: 04-20-17 Dr. Burr Medico adjuvant radiation,adjuvant aromatase inhibitor x 7 years,mammaprint: High risk, luminal type  Dr.Feng appointment scheduled for 08/20/16  Lymphedema issues, if any:    Pain issues, if any:    SAFETY ISSUES: No  Prior radiation?No   Pacemaker/ICD? No  Possible current pregnancy? No  Is the patient on methotrexate? No  Current Complaints / other details:  Divorced, , menarche age 82, G40P0,, miscarriage, oral  contraceptives 15 years, , depression,anxiety,moderatwe alcohol (1beers  Weekly ) ,  no tobacco use ,hx marijuana  2-3 x week  Mother Breast cancer dx age 80, , double mastectomy;  Living age 52,, ,Bi-polar,  depression,Father depression,,PTSD;,  sister depression

## 2017-06-25 ENCOUNTER — Telehealth: Payer: Self-pay | Admitting: Hematology

## 2017-06-25 ENCOUNTER — Encounter: Payer: Self-pay | Admitting: Hematology

## 2017-06-25 ENCOUNTER — Ambulatory Visit (HOSPITAL_BASED_OUTPATIENT_CLINIC_OR_DEPARTMENT_OTHER): Payer: BLUE CROSS/BLUE SHIELD | Admitting: Hematology

## 2017-06-25 VITALS — BP 119/68 | HR 88 | Temp 98.3°F | Resp 18 | Ht 64.5 in | Wt 140.5 lb

## 2017-06-25 DIAGNOSIS — E039 Hypothyroidism, unspecified: Secondary | ICD-10-CM

## 2017-06-25 DIAGNOSIS — C773 Secondary and unspecified malignant neoplasm of axilla and upper limb lymph nodes: Secondary | ICD-10-CM | POA: Diagnosis not present

## 2017-06-25 DIAGNOSIS — F329 Major depressive disorder, single episode, unspecified: Secondary | ICD-10-CM

## 2017-06-25 DIAGNOSIS — C50412 Malignant neoplasm of upper-outer quadrant of left female breast: Secondary | ICD-10-CM

## 2017-06-25 DIAGNOSIS — Z170 Estrogen receptor positive status [ER+]: Secondary | ICD-10-CM | POA: Diagnosis not present

## 2017-06-25 NOTE — Telephone Encounter (Signed)
Scheduled appt per 12/28 los - lab and f./u in 2 months - reminder letter sent in the mail.
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
## 2017-06-28 ENCOUNTER — Ambulatory Visit (INDEPENDENT_AMBULATORY_CARE_PROVIDER_SITE_OTHER): Payer: BLUE CROSS/BLUE SHIELD | Admitting: Physician Assistant

## 2017-06-28 ENCOUNTER — Other Ambulatory Visit: Payer: Self-pay

## 2017-06-28 VITALS — BP 120/62 | HR 88 | Temp 97.8°F | Resp 16 | Ht 64.5 in | Wt 135.0 lb

## 2017-06-28 DIAGNOSIS — Z13220 Encounter for screening for lipoid disorders: Secondary | ICD-10-CM

## 2017-06-28 DIAGNOSIS — Z1211 Encounter for screening for malignant neoplasm of colon: Secondary | ICD-10-CM

## 2017-06-28 DIAGNOSIS — Z0000 Encounter for general adult medical examination without abnormal findings: Secondary | ICD-10-CM

## 2017-06-28 DIAGNOSIS — Z130 Encounter for screening for diseases of the blood and blood-forming organs and certain disorders involving the immune mechanism: Secondary | ICD-10-CM

## 2017-06-28 DIAGNOSIS — Z13228 Encounter for screening for other metabolic disorders: Secondary | ICD-10-CM

## 2017-06-28 DIAGNOSIS — E039 Hypothyroidism, unspecified: Secondary | ICD-10-CM

## 2017-06-28 DIAGNOSIS — Z124 Encounter for screening for malignant neoplasm of cervix: Secondary | ICD-10-CM | POA: Diagnosis not present

## 2017-06-28 DIAGNOSIS — Z1329 Encounter for screening for other suspected endocrine disorder: Secondary | ICD-10-CM

## 2017-06-28 MED ORDER — THYROID 15 MG PO TABS: 15.0000 mg | ORAL_TABLET | Freq: Every day | ORAL | 1 refills | Status: DC

## 2017-06-28 NOTE — Patient Instructions (Addendum)
Keeping you healthy  Get these tests  Blood pressure- Have your blood pressure checked once a year by your healthcare provider.  Normal blood pressure is 120/80  Weight- Have your body mass index (BMI) calculated to screen for obesity.  BMI is a measure of body fat based on height and weight. You can also calculate your own BMI at [email or web address]/[email or web address]/.  Cholesterol- Have your cholesterol checked every year.  Diabetes- Have your blood sugar checked regularly if you have high blood pressure, high cholesterol, have a family history of diabetes or if you are overweight.  Screening for Colon Cancer- Colonoscopy starting at age 50.  Screening may begin sooner depending on your family history and other health conditions. Follow up colonoscopy as directed by your Gastroenterologist.  Screening for Prostate Cancer- Both blood work (PSA) and a rectal exam help screen for Prostate Cancer.  Screening begins at age 40 with African-American men and at age 50 with Caucasian men.  Screening may begin sooner depending on your family history.  Take these medicines  Aspirin- One aspirin daily can help prevent Heart disease and Stroke.  Flu shot- Every fall.  Tetanus- Every 10 years.  Zostavax- Once after the age of 60 to prevent Shingles.  Pneumonia shot- Once after the age of 65; if you are younger than 65, ask your healthcare provider if you need a Pneumonia shot.  Take these steps  Don't smoke- If you do smoke, talk to your doctor about quitting.  For tips on how to quit, go to www.smokefree.gov or call 1-800-QUIT-NOW.  Be physically active- Exercise 5 days a week for at least 30 minutes.  If you are not already physically active start slow and gradually work up to 30 minutes of moderate physical activity.  Examples of moderate activity include walking briskly, mowing the yard, dancing, swimming, bicycling, etc.  Eat a healthy diet- Eat a variety of healthy food such as fruits, vegetables,  low fat milk, low fat cheese, yogurt, lean meant, poultry, fish, beans, tofu, etc. For more information go to www.thenutritionsource.org  Drink alcohol in moderation- Limit alcohol intake to less than two drinks a day. Never drink and drive.  Dentist- Brush and floss twice daily; visit your dentist twice a year.  Depression- Your emotional health is as important as your physical health. If you're feeling down, or losing interest in things you would normally enjoy please talk to your healthcare provider.  Eye exam- Visit your eye doctor every year.  Safe sex- If you may be exposed to a sexually transmitted infection, use a condom.  Seat belts- Seat belts can save your life; always wear one.  Smoke/Carbon Monoxide detectors- These detectors need to be installed on the appropriate level of your home.  Replace batteries at least once a year.  Skin cancer- When out in the sun, cover up and use sunscreen 15 SPF or higher.  Violence- If anyone is threatening you, please tell your healthcare provider.  Living Will/ Health care power of attorney- Speak with your healthcare provider and family.   IF you received an x-ray today, you will receive an invoice from [facility]. Please contact [facility] at [phone] with questions or concerns regarding your invoice.   IF you received labwork today, you will receive an invoice from [facility]. Please contact [facility] at [phone] with questions or concerns regarding your invoice.   Our billing staff will not be able to assist you with questions regarding bills from these companies.  You will be contacted   with the lab results as soon as they are available. The fastest way to get your results is to activate your My Chart account. Instructions are located on the last page of this paperwork. If you have not heard from us regarding the results in 2 weeks, please contact this office.     
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
## 2017-06-30 ENCOUNTER — Ambulatory Visit (HOSPITAL_COMMUNITY)
Admission: RE | Admit: 2017-06-30 | Discharge: 2017-06-30 | Disposition: A | Discharge status: Home / Self Care | Payer: BLUE CROSS/BLUE SHIELD | Source: Ambulatory Visit | Attending: Hematology | Admitting: Hematology

## 2017-06-30 DIAGNOSIS — C50412 Malignant neoplasm of upper-outer quadrant of left female breast: Secondary | ICD-10-CM | POA: Diagnosis not present

## 2017-06-30 DIAGNOSIS — I700 Atherosclerosis of aorta: Secondary | ICD-10-CM | POA: Diagnosis not present

## 2017-06-30 DIAGNOSIS — Z170 Estrogen receptor positive status [ER+]: Principal | ICD-10-CM

## 2017-06-30 DIAGNOSIS — C50912 Malignant neoplasm of unspecified site of left female breast: Secondary | ICD-10-CM | POA: Diagnosis not present

## 2017-06-30 DIAGNOSIS — D259 Leiomyoma of uterus, unspecified: Secondary | ICD-10-CM | POA: Insufficient documentation

## 2017-06-30 DIAGNOSIS — Z9889 Other specified postprocedural states: Secondary | ICD-10-CM | POA: Insufficient documentation

## 2017-06-30 DIAGNOSIS — C50919 Malignant neoplasm of unspecified site of unspecified female breast: Secondary | ICD-10-CM | POA: Diagnosis not present

## 2017-06-30 LAB — CMP14+EGFR
ALBUMIN: 4.7 g/dL (ref 3.6–4.8)
ALT: 8 IU/L (ref 0–32)
AST: 25 IU/L (ref 0–40)
Albumin/Globulin Ratio: 1.6 (ref 1.2–2.2)
Alkaline Phosphatase: 96 IU/L (ref 39–117)
BUN / CREAT RATIO: 19 (ref 12–28)
BUN: 13 mg/dL (ref 8–27)
Bilirubin Total: 0.5 mg/dL (ref 0.0–1.2)
CO2: 17 mmol/L — AB (ref 20–29)
CREATININE: 0.68 mg/dL (ref 0.57–1.00)
Calcium: 10.2 mg/dL (ref 8.7–10.3)
Chloride: 104 mmol/L (ref 96–106)
GFR calc non Af Amer: 93 mL/min/{1.73_m2} (ref >59)
GFR, EST AFRICAN AMERICAN: 108 mL/min/{1.73_m2} (ref >59)
GLUCOSE: 86 mg/dL (ref 65–99)
Globulin, Total: 2.9 g/dL (ref 1.5–4.5)
Potassium: 4.1 mmol/L (ref 3.5–5.2)
Sodium: 143 mmol/L (ref 134–144)
TOTAL PROTEIN: 7.6 g/dL (ref 6.0–8.5)

## 2017-06-30 LAB — CBC

## 2017-06-30 LAB — PAP IG W/ RFLX HPV ASCU: PAP SMEAR COMMENT: 0

## 2017-06-30 LAB — LIPID PANEL
CHOL/HDL RATIO: 3.1 ratio (ref 0.0–4.4)
Cholesterol, Total: 249 mg/dL — ABNORMAL HIGH (ref 100–199)
HDL: 81 mg/dL (ref >39)
LDL CALC: 150 mg/dL — AB (ref 0–99)
Triglycerides: 91 mg/dL (ref 0–149)
VLDL CHOLESTEROL CAL: 18 mg/dL (ref 5–40)

## 2017-06-30 LAB — TSH: TSH: 4.19 u[IU]/mL (ref 0.450–4.500)

## 2017-06-30 MED ORDER — IOPAMIDOL (ISOVUE-300) INJECTION 61%
INTRAVENOUS | Status: AC
  Filled 2017-06-30: qty 100

## 2017-06-30 MED ORDER — TECHNETIUM TC 99M MEDRONATE IV KIT
25.0000 | PACK | Freq: Once | INTRAVENOUS | Status: AC | PRN
  Administered 2017-06-30: 21 via INTRAVENOUS

## 2017-06-30 MED ORDER — IOPAMIDOL (ISOVUE-300) INJECTION 61%
100.0000 mL | Freq: Once | INTRAVENOUS | Status: AC | PRN
  Administered 2017-06-30: 100 mL via INTRAVENOUS
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
## 2017-07-01 ENCOUNTER — Encounter: Payer: Self-pay | Admitting: Radiation Oncology

## 2017-07-01 ENCOUNTER — Ambulatory Visit
Admission: RE | Admit: 2017-07-01 | Discharge: 2017-07-01 | Disposition: A | Discharge status: Home / Self Care | Payer: BLUE CROSS/BLUE SHIELD | Source: Ambulatory Visit | Attending: Radiation Oncology | Admitting: Radiation Oncology

## 2017-07-01 VITALS — BP 118/73 | HR 86 | Temp 98.5°F | Resp 18 | Ht 64.5 in | Wt 136.4 lb

## 2017-07-01 DIAGNOSIS — Z803 Family history of malignant neoplasm of breast: Secondary | ICD-10-CM | POA: Diagnosis not present

## 2017-07-01 DIAGNOSIS — C50412 Malignant neoplasm of upper-outer quadrant of left female breast: Secondary | ICD-10-CM

## 2017-07-01 DIAGNOSIS — Z170 Estrogen receptor positive status [ER+]: Secondary | ICD-10-CM | POA: Diagnosis not present

## 2017-07-01 DIAGNOSIS — F329 Major depressive disorder, single episode, unspecified: Secondary | ICD-10-CM | POA: Diagnosis not present

## 2017-07-01 DIAGNOSIS — C773 Secondary and unspecified malignant neoplasm of axilla and upper limb lymph nodes: Secondary | ICD-10-CM | POA: Diagnosis not present

## 2017-07-01 DIAGNOSIS — Z510 Encounter for antineoplastic radiation therapy: Secondary | ICD-10-CM | POA: Diagnosis not present

## 2017-07-01 DIAGNOSIS — Z885 Allergy status to narcotic agent status: Secondary | ICD-10-CM | POA: Diagnosis not present

## 2017-07-01 DIAGNOSIS — Z9889 Other specified postprocedural states: Secondary | ICD-10-CM | POA: Diagnosis not present

## 2017-07-01 DIAGNOSIS — Z79899 Other long term (current) drug therapy: Secondary | ICD-10-CM | POA: Diagnosis not present

## 2017-07-01 DIAGNOSIS — E079 Disorder of thyroid, unspecified: Secondary | ICD-10-CM | POA: Diagnosis not present

## 2017-07-01 DIAGNOSIS — Z8249 Family history of ischemic heart disease and other diseases of the circulatory system: Secondary | ICD-10-CM | POA: Diagnosis not present

## 2017-07-01 DIAGNOSIS — Z818 Family history of other mental and behavioral disorders: Secondary | ICD-10-CM | POA: Diagnosis not present

## 2017-07-01 DIAGNOSIS — F419 Anxiety disorder, unspecified: Secondary | ICD-10-CM | POA: Diagnosis not present

## 2017-07-01 DIAGNOSIS — Z79891 Long term (current) use of opiate analgesic: Secondary | ICD-10-CM | POA: Diagnosis not present

## 2017-07-01 DIAGNOSIS — E039 Hypothyroidism, unspecified: Secondary | ICD-10-CM | POA: Diagnosis not present

## 2017-07-02 ENCOUNTER — Encounter: Payer: Self-pay | Admitting: *Deleted

## 2017-07-02 DIAGNOSIS — Z885 Allergy status to narcotic agent status: Secondary | ICD-10-CM | POA: Diagnosis not present

## 2017-07-02 DIAGNOSIS — Z8249 Family history of ischemic heart disease and other diseases of the circulatory system: Secondary | ICD-10-CM | POA: Diagnosis not present

## 2017-07-02 DIAGNOSIS — Z818 Family history of other mental and behavioral disorders: Secondary | ICD-10-CM | POA: Diagnosis not present

## 2017-07-02 DIAGNOSIS — E039 Hypothyroidism, unspecified: Secondary | ICD-10-CM | POA: Diagnosis not present

## 2017-07-02 DIAGNOSIS — Z170 Estrogen receptor positive status [ER+]: Secondary | ICD-10-CM | POA: Diagnosis not present

## 2017-07-02 DIAGNOSIS — C50412 Malignant neoplasm of upper-outer quadrant of left female breast: Secondary | ICD-10-CM | POA: Diagnosis not present

## 2017-07-02 DIAGNOSIS — E079 Disorder of thyroid, unspecified: Secondary | ICD-10-CM | POA: Diagnosis not present

## 2017-07-02 DIAGNOSIS — Z79891 Long term (current) use of opiate analgesic: Secondary | ICD-10-CM | POA: Diagnosis not present

## 2017-07-02 DIAGNOSIS — Z510 Encounter for antineoplastic radiation therapy: Secondary | ICD-10-CM | POA: Diagnosis not present

## 2017-07-02 DIAGNOSIS — F329 Major depressive disorder, single episode, unspecified: Secondary | ICD-10-CM | POA: Diagnosis not present

## 2017-07-02 DIAGNOSIS — Z803 Family history of malignant neoplasm of breast: Secondary | ICD-10-CM | POA: Diagnosis not present

## 2017-07-02 DIAGNOSIS — Z9889 Other specified postprocedural states: Secondary | ICD-10-CM | POA: Diagnosis not present

## 2017-07-02 DIAGNOSIS — F419 Anxiety disorder, unspecified: Secondary | ICD-10-CM | POA: Diagnosis not present

## 2017-07-02 DIAGNOSIS — Z79899 Other long term (current) drug therapy: Secondary | ICD-10-CM | POA: Diagnosis not present

## 2017-07-02 NOTE — Progress Notes (Addendum)
  Radiation Oncology         [phone] ________________________________  Name: Angela [name] MRN: [id number]  Date: 07/01/2017  DOB: Feb 22, 1956  Diagnosis DIAGNOSIS:     ICD-10-CM   1. Malignant neoplasm of upper-outer quadrant of left breast in female, estrogen receptor positive ([place]) C50.412    Z17.0      SIMULATION AND TREATMENT PLANNING NOTE  The patient presented for simulation prior to beginning her course of radiation treatment for her diagnosis of left-sided breast cancer. The patient was placed in a supine position on a breast board. A customized vac-lock bag was also constructed and this complex treatment device will be used on a daily basis during her treatment. In this fashion, a CT scan was obtained through the chest area and an isocenter was placed near the chest wall at the upper aspect of the right chest. A breath-hold technique has also been evaluated to determine if this significantly improves the spatial relationship between the target region and the heart. Based on this analysis, a breath-hold technique has been ordered for the patient's treatment.  The patient will be planned to receive a course of radiation initially to a dose of 50.4 gray. This will consist of a 4 field technique targeting the left breast as well as the supraclavicular region. Therefore 2 customized medial and lateral tangent fields have been created targeting the chest wall, and also 2 additional customized fields have been designed to treat the supraclavicular region both with a left supraclavicular field and a left posterior axillary boost field. A forward planning/reduced field technique will also be evaluated to determine if this significantly improves the dose homogeneity of the overall plan. Therefore, additional customized blocks/fields may be necessary.  This initial treatment will be accomplished at 1.8 gray per fraction.   The initial plan will consist of a 3-D conformal technique. The target  volume/scar, heart and lungs have been contoured and dose volume histograms of each of these structures will be evaluated as part of the 3-D conformal treatment planning process.   It is anticipated that the patient will then receive a 10 gray boost to the surgical scar. This will be accomplished at 2 gray per fraction. The final anticipated total dose therefore will correspond to 60.4 gray.   Special treatment procedure was performed today due to the extra time and effort required by myself to plan and prepare this patient for deep inspiration breath hold technique.  I have determined cardiac sparing to be of benefit to this patient to prevent long term cardiac damage due to radiation of the heart.  Bellows were placed on the patient's abdomen. To facilitate cardiac sparing, the patient was coached by the radiation therapists on breath hold techniques and breathing practice was performed. Practice waveforms were obtained. The patient was then scanned while maintaining breath hold in the treatment position.  This image was then transferred over to the imaging specialist. The imaging specialist then created a fusion of the free breathing and breath hold scans using the chest wall as the stable structure. I personally reviewed the fusion in axial, coronal and sagittal image planes.  Excellent cardiac sparing was obtained.  I felt the patient is an appropriate candidate for breath hold and the patient will be treated as such.  The image fusion was then reviewed with the patient to reinforce the necessity of reproducible breath hold.     _______________________________   Jodelle Gross, MD, PhD

## 2017-07-02 NOTE — Addendum Note (Signed)
Encounter addended by: Kyung Rudd, MD on: 07/02/2017 7:44 AM  Actions taken: Sign clinical note

## 2017-07-02 NOTE — Progress Notes (Signed)
  Radiation Oncology         (336) [phone] ________________________________  Name: Angela [name] MRN: [id number]  Date: 07/01/2017  DOB: 1956/05/01  Optical Surface Tracking Plan:  Since intensity modulated radiotherapy (IMRT) and 3D conformal radiation treatment methods are predicated on accurate and precise positioning for treatment, intrafraction motion monitoring is medically necessary to ensure accurate and safe treatment delivery.  The ability to quantify intrafraction motion without excessive ionizing radiation dose can only be performed with optical surface tracking. Accordingly, surface imaging offers the opportunity to obtain 3D measurements of patient position throughout IMRT and 3D treatments without excessive radiation exposure.  I am ordering optical surface tracking for this patient's upcoming course of radiotherapy. ________________________________  Kyung Rudd, MD 07/02/2017 7:44 AM    Reference:   Particia Jasper, et al. Surface imaging-based analysis of intrafraction motion for breast radiotherapy patients.Journal of [place], n. 6, nov. 2014. ISSN [id number].   Available at: <http://www.jacmp.org/index.php/jacmp/article/view/4957>.

## 2017-07-04 ENCOUNTER — Encounter: Payer: Self-pay | Admitting: Physician Assistant

## 2017-07-05 DIAGNOSIS — Z1212 Encounter for screening for malignant neoplasm of rectum: Secondary | ICD-10-CM | POA: Diagnosis not present

## 2017-07-05 DIAGNOSIS — Z1211 Encounter for screening for malignant neoplasm of colon: Secondary | ICD-10-CM | POA: Diagnosis not present

## 2017-07-06 ENCOUNTER — Telehealth: Payer: Self-pay | Admitting: Physician Assistant

## 2017-07-06 NOTE — Telephone Encounter (Signed)
Patient called to receive lab results, results given per note Ivar Drape, PA 07/04/2017, patient verbalized understanding and says she started the fish oil pills about 1 week ago. Unable to chart in result notes due to result note not routed to [facility].

## 2017-07-12 ENCOUNTER — Ambulatory Visit
Admission: RE | Admit: 2017-07-12 | Discharge: 2017-07-12 | Disposition: A | Discharge status: Home / Self Care | Payer: BLUE CROSS/BLUE SHIELD | Source: Ambulatory Visit | Attending: Radiation Oncology | Admitting: Radiation Oncology

## 2017-07-12 DIAGNOSIS — Z803 Family history of malignant neoplasm of breast: Secondary | ICD-10-CM | POA: Diagnosis not present

## 2017-07-12 DIAGNOSIS — C50412 Malignant neoplasm of upper-outer quadrant of left female breast: Secondary | ICD-10-CM | POA: Diagnosis not present

## 2017-07-12 DIAGNOSIS — Z885 Allergy status to narcotic agent status: Secondary | ICD-10-CM | POA: Diagnosis not present

## 2017-07-12 DIAGNOSIS — Z510 Encounter for antineoplastic radiation therapy: Secondary | ICD-10-CM | POA: Diagnosis not present

## 2017-07-12 DIAGNOSIS — E039 Hypothyroidism, unspecified: Secondary | ICD-10-CM | POA: Diagnosis not present

## 2017-07-12 DIAGNOSIS — F419 Anxiety disorder, unspecified: Secondary | ICD-10-CM | POA: Diagnosis not present

## 2017-07-12 DIAGNOSIS — Z818 Family history of other mental and behavioral disorders: Secondary | ICD-10-CM | POA: Diagnosis not present

## 2017-07-12 DIAGNOSIS — E079 Disorder of thyroid, unspecified: Secondary | ICD-10-CM | POA: Diagnosis not present

## 2017-07-12 DIAGNOSIS — Z8249 Family history of ischemic heart disease and other diseases of the circulatory system: Secondary | ICD-10-CM | POA: Diagnosis not present

## 2017-07-12 DIAGNOSIS — Z79899 Other long term (current) drug therapy: Secondary | ICD-10-CM | POA: Diagnosis not present

## 2017-07-12 DIAGNOSIS — Z9889 Other specified postprocedural states: Secondary | ICD-10-CM | POA: Diagnosis not present

## 2017-07-12 DIAGNOSIS — Z170 Estrogen receptor positive status [ER+]: Secondary | ICD-10-CM | POA: Diagnosis not present

## 2017-07-12 DIAGNOSIS — F329 Major depressive disorder, single episode, unspecified: Secondary | ICD-10-CM | POA: Diagnosis not present

## 2017-07-12 DIAGNOSIS — Z79891 Long term (current) use of opiate analgesic: Secondary | ICD-10-CM | POA: Diagnosis not present

## 2017-07-13 ENCOUNTER — Ambulatory Visit
Admission: RE | Admit: 2017-07-13 | Discharge: 2017-07-13 | Disposition: A | Discharge status: Home / Self Care | Payer: BLUE CROSS/BLUE SHIELD | Source: Ambulatory Visit | Attending: Radiation Oncology | Admitting: Radiation Oncology

## 2017-07-13 DIAGNOSIS — Z803 Family history of malignant neoplasm of breast: Secondary | ICD-10-CM | POA: Diagnosis not present

## 2017-07-13 DIAGNOSIS — Z9889 Other specified postprocedural states: Secondary | ICD-10-CM | POA: Diagnosis not present

## 2017-07-13 DIAGNOSIS — Z79891 Long term (current) use of opiate analgesic: Secondary | ICD-10-CM | POA: Diagnosis not present

## 2017-07-13 DIAGNOSIS — Z170 Estrogen receptor positive status [ER+]: Secondary | ICD-10-CM | POA: Diagnosis not present

## 2017-07-13 DIAGNOSIS — E079 Disorder of thyroid, unspecified: Secondary | ICD-10-CM | POA: Diagnosis not present

## 2017-07-13 DIAGNOSIS — F329 Major depressive disorder, single episode, unspecified: Secondary | ICD-10-CM | POA: Diagnosis not present

## 2017-07-13 DIAGNOSIS — E039 Hypothyroidism, unspecified: Secondary | ICD-10-CM | POA: Diagnosis not present

## 2017-07-13 DIAGNOSIS — Z818 Family history of other mental and behavioral disorders: Secondary | ICD-10-CM | POA: Diagnosis not present

## 2017-07-13 DIAGNOSIS — Z885 Allergy status to narcotic agent status: Secondary | ICD-10-CM | POA: Diagnosis not present

## 2017-07-13 DIAGNOSIS — C50412 Malignant neoplasm of upper-outer quadrant of left female breast: Secondary | ICD-10-CM | POA: Diagnosis not present

## 2017-07-13 DIAGNOSIS — Z79899 Other long term (current) drug therapy: Secondary | ICD-10-CM | POA: Diagnosis not present

## 2017-07-13 DIAGNOSIS — F419 Anxiety disorder, unspecified: Secondary | ICD-10-CM | POA: Diagnosis not present

## 2017-07-13 DIAGNOSIS — Z510 Encounter for antineoplastic radiation therapy: Secondary | ICD-10-CM | POA: Diagnosis not present

## 2017-07-13 DIAGNOSIS — Z8249 Family history of ischemic heart disease and other diseases of the circulatory system: Secondary | ICD-10-CM | POA: Diagnosis not present

## 2017-07-14 ENCOUNTER — Telehealth: Payer: Self-pay | Admitting: *Deleted

## 2017-07-14 ENCOUNTER — Ambulatory Visit
Admission: RE | Admit: 2017-07-14 | Discharge: 2017-07-14 | Disposition: A | Discharge status: Home / Self Care | Payer: BLUE CROSS/BLUE SHIELD | Source: Ambulatory Visit | Attending: Radiation Oncology | Admitting: Radiation Oncology

## 2017-07-14 DIAGNOSIS — E079 Disorder of thyroid, unspecified: Secondary | ICD-10-CM | POA: Diagnosis not present

## 2017-07-14 DIAGNOSIS — F329 Major depressive disorder, single episode, unspecified: Secondary | ICD-10-CM | POA: Diagnosis not present

## 2017-07-14 DIAGNOSIS — Z885 Allergy status to narcotic agent status: Secondary | ICD-10-CM | POA: Diagnosis not present

## 2017-07-14 DIAGNOSIS — Z818 Family history of other mental and behavioral disorders: Secondary | ICD-10-CM | POA: Diagnosis not present

## 2017-07-14 DIAGNOSIS — Z170 Estrogen receptor positive status [ER+]: Secondary | ICD-10-CM | POA: Diagnosis not present

## 2017-07-14 DIAGNOSIS — Z510 Encounter for antineoplastic radiation therapy: Secondary | ICD-10-CM | POA: Diagnosis not present

## 2017-07-14 DIAGNOSIS — Z803 Family history of malignant neoplasm of breast: Secondary | ICD-10-CM | POA: Diagnosis not present

## 2017-07-14 DIAGNOSIS — Z8249 Family history of ischemic heart disease and other diseases of the circulatory system: Secondary | ICD-10-CM | POA: Diagnosis not present

## 2017-07-14 DIAGNOSIS — C50412 Malignant neoplasm of upper-outer quadrant of left female breast: Secondary | ICD-10-CM | POA: Diagnosis not present

## 2017-07-14 DIAGNOSIS — F419 Anxiety disorder, unspecified: Secondary | ICD-10-CM | POA: Diagnosis not present

## 2017-07-14 DIAGNOSIS — Z79899 Other long term (current) drug therapy: Secondary | ICD-10-CM | POA: Diagnosis not present

## 2017-07-14 DIAGNOSIS — E039 Hypothyroidism, unspecified: Secondary | ICD-10-CM | POA: Diagnosis not present

## 2017-07-14 DIAGNOSIS — Z79891 Long term (current) use of opiate analgesic: Secondary | ICD-10-CM | POA: Diagnosis not present

## 2017-07-14 DIAGNOSIS — Z9889 Other specified postprocedural states: Secondary | ICD-10-CM | POA: Diagnosis not present

## 2017-07-14 NOTE — Telephone Encounter (Signed)
TCT patient with results of CT scan and bone scan done 06/30/17. Results were negative for metastasis. Left Vm message on identified phone for pt to return call to [phone] at her earliest convenience.

## 2017-07-15 ENCOUNTER — Ambulatory Visit
Admission: RE | Admit: 2017-07-15 | Discharge: 2017-07-15 | Disposition: A | Discharge status: Home / Self Care | Payer: BLUE CROSS/BLUE SHIELD | Source: Ambulatory Visit | Attending: Radiation Oncology | Admitting: Radiation Oncology

## 2017-07-15 DIAGNOSIS — F329 Major depressive disorder, single episode, unspecified: Secondary | ICD-10-CM | POA: Diagnosis not present

## 2017-07-15 DIAGNOSIS — Z510 Encounter for antineoplastic radiation therapy: Secondary | ICD-10-CM | POA: Diagnosis not present

## 2017-07-15 DIAGNOSIS — Z79891 Long term (current) use of opiate analgesic: Secondary | ICD-10-CM | POA: Diagnosis not present

## 2017-07-15 DIAGNOSIS — E039 Hypothyroidism, unspecified: Secondary | ICD-10-CM | POA: Diagnosis not present

## 2017-07-15 DIAGNOSIS — E079 Disorder of thyroid, unspecified: Secondary | ICD-10-CM | POA: Diagnosis not present

## 2017-07-15 DIAGNOSIS — Z9889 Other specified postprocedural states: Secondary | ICD-10-CM | POA: Diagnosis not present

## 2017-07-15 DIAGNOSIS — Z803 Family history of malignant neoplasm of breast: Secondary | ICD-10-CM | POA: Diagnosis not present

## 2017-07-15 DIAGNOSIS — Z818 Family history of other mental and behavioral disorders: Secondary | ICD-10-CM | POA: Diagnosis not present

## 2017-07-15 DIAGNOSIS — F419 Anxiety disorder, unspecified: Secondary | ICD-10-CM | POA: Diagnosis not present

## 2017-07-15 DIAGNOSIS — Z79899 Other long term (current) drug therapy: Secondary | ICD-10-CM | POA: Diagnosis not present

## 2017-07-15 DIAGNOSIS — C50412 Malignant neoplasm of upper-outer quadrant of left female breast: Secondary | ICD-10-CM | POA: Diagnosis not present

## 2017-07-15 DIAGNOSIS — Z170 Estrogen receptor positive status [ER+]: Secondary | ICD-10-CM | POA: Diagnosis not present

## 2017-07-15 DIAGNOSIS — Z8249 Family history of ischemic heart disease and other diseases of the circulatory system: Secondary | ICD-10-CM | POA: Diagnosis not present

## 2017-07-15 DIAGNOSIS — Z885 Allergy status to narcotic agent status: Secondary | ICD-10-CM | POA: Diagnosis not present

## 2017-07-16 ENCOUNTER — Ambulatory Visit
Admission: RE | Admit: 2017-07-16 | Discharge: 2017-07-16 | Disposition: A | Discharge status: Home / Self Care | Payer: BLUE CROSS/BLUE SHIELD | Source: Ambulatory Visit | Attending: Radiation Oncology | Admitting: Radiation Oncology

## 2017-07-16 DIAGNOSIS — E039 Hypothyroidism, unspecified: Secondary | ICD-10-CM | POA: Diagnosis not present

## 2017-07-16 DIAGNOSIS — Z818 Family history of other mental and behavioral disorders: Secondary | ICD-10-CM | POA: Diagnosis not present

## 2017-07-16 DIAGNOSIS — F419 Anxiety disorder, unspecified: Secondary | ICD-10-CM | POA: Diagnosis not present

## 2017-07-16 DIAGNOSIS — F329 Major depressive disorder, single episode, unspecified: Secondary | ICD-10-CM | POA: Diagnosis not present

## 2017-07-16 DIAGNOSIS — Z803 Family history of malignant neoplasm of breast: Secondary | ICD-10-CM | POA: Diagnosis not present

## 2017-07-16 DIAGNOSIS — Z170 Estrogen receptor positive status [ER+]: Secondary | ICD-10-CM | POA: Diagnosis not present

## 2017-07-16 DIAGNOSIS — Z8249 Family history of ischemic heart disease and other diseases of the circulatory system: Secondary | ICD-10-CM | POA: Diagnosis not present

## 2017-07-16 DIAGNOSIS — C50412 Malignant neoplasm of upper-outer quadrant of left female breast: Secondary | ICD-10-CM | POA: Diagnosis not present

## 2017-07-16 DIAGNOSIS — E079 Disorder of thyroid, unspecified: Secondary | ICD-10-CM | POA: Diagnosis not present

## 2017-07-16 DIAGNOSIS — Z79891 Long term (current) use of opiate analgesic: Secondary | ICD-10-CM | POA: Diagnosis not present

## 2017-07-16 DIAGNOSIS — Z79899 Other long term (current) drug therapy: Secondary | ICD-10-CM | POA: Diagnosis not present

## 2017-07-16 DIAGNOSIS — Z885 Allergy status to narcotic agent status: Secondary | ICD-10-CM | POA: Diagnosis not present

## 2017-07-16 DIAGNOSIS — Z510 Encounter for antineoplastic radiation therapy: Secondary | ICD-10-CM | POA: Diagnosis not present

## 2017-07-16 DIAGNOSIS — Z9889 Other specified postprocedural states: Secondary | ICD-10-CM | POA: Diagnosis not present

## 2017-07-19 ENCOUNTER — Ambulatory Visit
Admission: RE | Admit: 2017-07-19 | Discharge: 2017-07-19 | Disposition: A | Discharge status: Home / Self Care | Payer: BLUE CROSS/BLUE SHIELD | Source: Ambulatory Visit | Attending: Radiation Oncology | Admitting: Radiation Oncology

## 2017-07-19 DIAGNOSIS — E039 Hypothyroidism, unspecified: Secondary | ICD-10-CM | POA: Diagnosis not present

## 2017-07-19 DIAGNOSIS — Z79891 Long term (current) use of opiate analgesic: Secondary | ICD-10-CM | POA: Diagnosis not present

## 2017-07-19 DIAGNOSIS — Z803 Family history of malignant neoplasm of breast: Secondary | ICD-10-CM | POA: Diagnosis not present

## 2017-07-19 DIAGNOSIS — Z818 Family history of other mental and behavioral disorders: Secondary | ICD-10-CM | POA: Diagnosis not present

## 2017-07-19 DIAGNOSIS — F419 Anxiety disorder, unspecified: Secondary | ICD-10-CM | POA: Diagnosis not present

## 2017-07-19 DIAGNOSIS — Z170 Estrogen receptor positive status [ER+]: Principal | ICD-10-CM

## 2017-07-19 DIAGNOSIS — Z8249 Family history of ischemic heart disease and other diseases of the circulatory system: Secondary | ICD-10-CM | POA: Diagnosis not present

## 2017-07-19 DIAGNOSIS — Z885 Allergy status to narcotic agent status: Secondary | ICD-10-CM | POA: Diagnosis not present

## 2017-07-19 DIAGNOSIS — C50412 Malignant neoplasm of upper-outer quadrant of left female breast: Secondary | ICD-10-CM

## 2017-07-19 DIAGNOSIS — E079 Disorder of thyroid, unspecified: Secondary | ICD-10-CM | POA: Diagnosis not present

## 2017-07-19 DIAGNOSIS — Z9889 Other specified postprocedural states: Secondary | ICD-10-CM | POA: Diagnosis not present

## 2017-07-19 DIAGNOSIS — F329 Major depressive disorder, single episode, unspecified: Secondary | ICD-10-CM | POA: Diagnosis not present

## 2017-07-19 DIAGNOSIS — Z79899 Other long term (current) drug therapy: Secondary | ICD-10-CM | POA: Diagnosis not present

## 2017-07-19 DIAGNOSIS — Z510 Encounter for antineoplastic radiation therapy: Secondary | ICD-10-CM | POA: Diagnosis not present

## 2017-07-19 MED ORDER — RADIAPLEXRX EX GEL
Freq: Two times a day (BID) | CUTANEOUS | Status: DC
  Administered 2017-07-19: 09:00:00 via TOPICAL

## 2017-07-19 MED ORDER — ALRA NON-METALLIC DEODORANT (RAD-ONC)
1.0000 "application " | Freq: Once | TOPICAL | Status: AC
  Administered 2017-07-19: 1 via TOPICAL

## 2017-07-19 NOTE — Progress Notes (Signed)
Pt here for patient teaching.  Pt given Radiation and You booklet, skin care instructions, Alra deodorant and Radiaplex gel.  Reviewed areas of pertinence such as fatigue, skin changes, breast tenderness and breast swelling . Pt able to give teach back of to pat skin, use unscented/gentle soap, have Imodium on hand and drink plenty of water,apply Radiaplex bid, avoid applying anything to skin within 4 hours of treatment, avoid wearing an under wire bra and to use an electric razor if they must shave. Pt verbalizes understanding of information given and will contact nursing with any questions or concerns.     Http://rtanswers.org/treatmentinformation/whattoexpect/index      

## 2017-07-20 ENCOUNTER — Ambulatory Visit
Admission: RE | Admit: 2017-07-20 | Discharge: 2017-07-20 | Disposition: A | Discharge status: Home / Self Care | Payer: BLUE CROSS/BLUE SHIELD | Source: Ambulatory Visit | Attending: Radiation Oncology | Admitting: Radiation Oncology

## 2017-07-20 DIAGNOSIS — Z885 Allergy status to narcotic agent status: Secondary | ICD-10-CM | POA: Diagnosis not present

## 2017-07-20 DIAGNOSIS — Z9889 Other specified postprocedural states: Secondary | ICD-10-CM | POA: Diagnosis not present

## 2017-07-20 DIAGNOSIS — F329 Major depressive disorder, single episode, unspecified: Secondary | ICD-10-CM | POA: Diagnosis not present

## 2017-07-20 DIAGNOSIS — Z803 Family history of malignant neoplasm of breast: Secondary | ICD-10-CM | POA: Diagnosis not present

## 2017-07-20 DIAGNOSIS — Z79891 Long term (current) use of opiate analgesic: Secondary | ICD-10-CM | POA: Diagnosis not present

## 2017-07-20 DIAGNOSIS — F419 Anxiety disorder, unspecified: Secondary | ICD-10-CM | POA: Diagnosis not present

## 2017-07-20 DIAGNOSIS — Z818 Family history of other mental and behavioral disorders: Secondary | ICD-10-CM | POA: Diagnosis not present

## 2017-07-20 DIAGNOSIS — E079 Disorder of thyroid, unspecified: Secondary | ICD-10-CM | POA: Diagnosis not present

## 2017-07-20 DIAGNOSIS — Z8249 Family history of ischemic heart disease and other diseases of the circulatory system: Secondary | ICD-10-CM | POA: Diagnosis not present

## 2017-07-20 DIAGNOSIS — Z510 Encounter for antineoplastic radiation therapy: Secondary | ICD-10-CM | POA: Diagnosis not present

## 2017-07-20 DIAGNOSIS — C50412 Malignant neoplasm of upper-outer quadrant of left female breast: Secondary | ICD-10-CM | POA: Diagnosis not present

## 2017-07-20 DIAGNOSIS — Z79899 Other long term (current) drug therapy: Secondary | ICD-10-CM | POA: Diagnosis not present

## 2017-07-20 DIAGNOSIS — E039 Hypothyroidism, unspecified: Secondary | ICD-10-CM | POA: Diagnosis not present

## 2017-07-20 DIAGNOSIS — Z170 Estrogen receptor positive status [ER+]: Secondary | ICD-10-CM | POA: Diagnosis not present

## 2017-07-21 ENCOUNTER — Other Ambulatory Visit: Payer: Self-pay | Admitting: *Deleted

## 2017-07-21 ENCOUNTER — Ambulatory Visit
Admission: RE | Admit: 2017-07-21 | Discharge: 2017-07-21 | Disposition: A | Discharge status: Home / Self Care | Payer: BLUE CROSS/BLUE SHIELD | Source: Ambulatory Visit | Attending: Radiation Oncology | Admitting: Radiation Oncology

## 2017-07-21 DIAGNOSIS — Z803 Family history of malignant neoplasm of breast: Secondary | ICD-10-CM | POA: Diagnosis not present

## 2017-07-21 DIAGNOSIS — Z79899 Other long term (current) drug therapy: Secondary | ICD-10-CM | POA: Diagnosis not present

## 2017-07-21 DIAGNOSIS — F329 Major depressive disorder, single episode, unspecified: Secondary | ICD-10-CM | POA: Diagnosis not present

## 2017-07-21 DIAGNOSIS — E039 Hypothyroidism, unspecified: Secondary | ICD-10-CM | POA: Diagnosis not present

## 2017-07-21 DIAGNOSIS — E079 Disorder of thyroid, unspecified: Secondary | ICD-10-CM | POA: Diagnosis not present

## 2017-07-21 DIAGNOSIS — F419 Anxiety disorder, unspecified: Secondary | ICD-10-CM | POA: Diagnosis not present

## 2017-07-21 DIAGNOSIS — Z818 Family history of other mental and behavioral disorders: Secondary | ICD-10-CM | POA: Diagnosis not present

## 2017-07-21 DIAGNOSIS — Z510 Encounter for antineoplastic radiation therapy: Secondary | ICD-10-CM | POA: Diagnosis not present

## 2017-07-21 DIAGNOSIS — Z8249 Family history of ischemic heart disease and other diseases of the circulatory system: Secondary | ICD-10-CM | POA: Diagnosis not present

## 2017-07-21 DIAGNOSIS — Z9889 Other specified postprocedural states: Secondary | ICD-10-CM | POA: Diagnosis not present

## 2017-07-21 DIAGNOSIS — Z170 Estrogen receptor positive status [ER+]: Secondary | ICD-10-CM | POA: Diagnosis not present

## 2017-07-21 DIAGNOSIS — Z885 Allergy status to narcotic agent status: Secondary | ICD-10-CM | POA: Diagnosis not present

## 2017-07-21 DIAGNOSIS — Z79891 Long term (current) use of opiate analgesic: Secondary | ICD-10-CM | POA: Diagnosis not present

## 2017-07-21 DIAGNOSIS — C50412 Malignant neoplasm of upper-outer quadrant of left female breast: Secondary | ICD-10-CM | POA: Diagnosis not present

## 2017-07-21 LAB — COLOGUARD: Cologuard: NEGATIVE

## 2017-07-21 MED ORDER — LETROZOLE 2.5 MG PO TABS: 2.5000 mg | ORAL_TABLET | Freq: Every day | ORAL | 3 refills | Status: DC

## 2017-07-22 ENCOUNTER — Ambulatory Visit
Admission: RE | Admit: 2017-07-22 | Discharge: 2017-07-22 | Disposition: A | Discharge status: Home / Self Care | Payer: BLUE CROSS/BLUE SHIELD | Source: Ambulatory Visit | Attending: Radiation Oncology | Admitting: Radiation Oncology

## 2017-07-22 DIAGNOSIS — F329 Major depressive disorder, single episode, unspecified: Secondary | ICD-10-CM | POA: Diagnosis not present

## 2017-07-22 DIAGNOSIS — Z8249 Family history of ischemic heart disease and other diseases of the circulatory system: Secondary | ICD-10-CM | POA: Diagnosis not present

## 2017-07-22 DIAGNOSIS — E039 Hypothyroidism, unspecified: Secondary | ICD-10-CM | POA: Diagnosis not present

## 2017-07-22 DIAGNOSIS — Z885 Allergy status to narcotic agent status: Secondary | ICD-10-CM | POA: Diagnosis not present

## 2017-07-22 DIAGNOSIS — Z803 Family history of malignant neoplasm of breast: Secondary | ICD-10-CM | POA: Diagnosis not present

## 2017-07-22 DIAGNOSIS — C50412 Malignant neoplasm of upper-outer quadrant of left female breast: Secondary | ICD-10-CM | POA: Diagnosis not present

## 2017-07-22 DIAGNOSIS — Z79891 Long term (current) use of opiate analgesic: Secondary | ICD-10-CM | POA: Diagnosis not present

## 2017-07-22 DIAGNOSIS — F419 Anxiety disorder, unspecified: Secondary | ICD-10-CM | POA: Diagnosis not present

## 2017-07-22 DIAGNOSIS — Z170 Estrogen receptor positive status [ER+]: Secondary | ICD-10-CM | POA: Diagnosis not present

## 2017-07-22 DIAGNOSIS — Z818 Family history of other mental and behavioral disorders: Secondary | ICD-10-CM | POA: Diagnosis not present

## 2017-07-22 DIAGNOSIS — Z79899 Other long term (current) drug therapy: Secondary | ICD-10-CM | POA: Diagnosis not present

## 2017-07-22 DIAGNOSIS — E079 Disorder of thyroid, unspecified: Secondary | ICD-10-CM | POA: Diagnosis not present

## 2017-07-22 DIAGNOSIS — Z9889 Other specified postprocedural states: Secondary | ICD-10-CM | POA: Diagnosis not present

## 2017-07-22 DIAGNOSIS — Z510 Encounter for antineoplastic radiation therapy: Secondary | ICD-10-CM | POA: Diagnosis not present

## 2017-07-23 ENCOUNTER — Ambulatory Visit
Admission: RE | Admit: 2017-07-23 | Discharge: 2017-07-23 | Disposition: A | Discharge status: Home / Self Care | Payer: BLUE CROSS/BLUE SHIELD | Source: Ambulatory Visit | Attending: Radiation Oncology | Admitting: Radiation Oncology

## 2017-07-23 DIAGNOSIS — Z510 Encounter for antineoplastic radiation therapy: Secondary | ICD-10-CM | POA: Diagnosis not present

## 2017-07-23 DIAGNOSIS — E039 Hypothyroidism, unspecified: Secondary | ICD-10-CM | POA: Diagnosis not present

## 2017-07-23 DIAGNOSIS — Z8249 Family history of ischemic heart disease and other diseases of the circulatory system: Secondary | ICD-10-CM | POA: Diagnosis not present

## 2017-07-23 DIAGNOSIS — F419 Anxiety disorder, unspecified: Secondary | ICD-10-CM | POA: Diagnosis not present

## 2017-07-23 DIAGNOSIS — Z818 Family history of other mental and behavioral disorders: Secondary | ICD-10-CM | POA: Diagnosis not present

## 2017-07-23 DIAGNOSIS — Z79899 Other long term (current) drug therapy: Secondary | ICD-10-CM | POA: Diagnosis not present

## 2017-07-23 DIAGNOSIS — Z803 Family history of malignant neoplasm of breast: Secondary | ICD-10-CM | POA: Diagnosis not present

## 2017-07-23 DIAGNOSIS — Z9889 Other specified postprocedural states: Secondary | ICD-10-CM | POA: Diagnosis not present

## 2017-07-23 DIAGNOSIS — Z170 Estrogen receptor positive status [ER+]: Secondary | ICD-10-CM | POA: Diagnosis not present

## 2017-07-23 DIAGNOSIS — Z79891 Long term (current) use of opiate analgesic: Secondary | ICD-10-CM | POA: Diagnosis not present

## 2017-07-23 DIAGNOSIS — C50412 Malignant neoplasm of upper-outer quadrant of left female breast: Secondary | ICD-10-CM | POA: Diagnosis not present

## 2017-07-23 DIAGNOSIS — Z885 Allergy status to narcotic agent status: Secondary | ICD-10-CM | POA: Diagnosis not present

## 2017-07-23 DIAGNOSIS — F329 Major depressive disorder, single episode, unspecified: Secondary | ICD-10-CM | POA: Diagnosis not present

## 2017-07-23 DIAGNOSIS — E079 Disorder of thyroid, unspecified: Secondary | ICD-10-CM | POA: Diagnosis not present

## 2017-07-26 ENCOUNTER — Ambulatory Visit
Admission: RE | Admit: 2017-07-26 | Discharge: 2017-07-26 | Disposition: A | Discharge status: Home / Self Care | Payer: BLUE CROSS/BLUE SHIELD | Source: Ambulatory Visit | Attending: Radiation Oncology | Admitting: Radiation Oncology

## 2017-07-26 DIAGNOSIS — C50412 Malignant neoplasm of upper-outer quadrant of left female breast: Secondary | ICD-10-CM | POA: Diagnosis not present

## 2017-07-26 DIAGNOSIS — F419 Anxiety disorder, unspecified: Secondary | ICD-10-CM | POA: Diagnosis not present

## 2017-07-26 DIAGNOSIS — Z8249 Family history of ischemic heart disease and other diseases of the circulatory system: Secondary | ICD-10-CM | POA: Diagnosis not present

## 2017-07-26 DIAGNOSIS — Z818 Family history of other mental and behavioral disorders: Secondary | ICD-10-CM | POA: Diagnosis not present

## 2017-07-26 DIAGNOSIS — F329 Major depressive disorder, single episode, unspecified: Secondary | ICD-10-CM | POA: Diagnosis not present

## 2017-07-26 DIAGNOSIS — Z885 Allergy status to narcotic agent status: Secondary | ICD-10-CM | POA: Diagnosis not present

## 2017-07-26 DIAGNOSIS — E079 Disorder of thyroid, unspecified: Secondary | ICD-10-CM | POA: Diagnosis not present

## 2017-07-26 DIAGNOSIS — Z79899 Other long term (current) drug therapy: Secondary | ICD-10-CM | POA: Diagnosis not present

## 2017-07-26 DIAGNOSIS — Z803 Family history of malignant neoplasm of breast: Secondary | ICD-10-CM | POA: Diagnosis not present

## 2017-07-26 DIAGNOSIS — Z170 Estrogen receptor positive status [ER+]: Secondary | ICD-10-CM | POA: Diagnosis not present

## 2017-07-26 DIAGNOSIS — Z510 Encounter for antineoplastic radiation therapy: Secondary | ICD-10-CM | POA: Diagnosis not present

## 2017-07-26 DIAGNOSIS — Z9889 Other specified postprocedural states: Secondary | ICD-10-CM | POA: Diagnosis not present

## 2017-07-26 DIAGNOSIS — Z79891 Long term (current) use of opiate analgesic: Secondary | ICD-10-CM | POA: Diagnosis not present

## 2017-07-26 DIAGNOSIS — E039 Hypothyroidism, unspecified: Secondary | ICD-10-CM | POA: Diagnosis not present

## 2017-07-27 ENCOUNTER — Ambulatory Visit
Admission: RE | Admit: 2017-07-27 | Discharge: 2017-07-27 | Disposition: A | Discharge status: Home / Self Care | Payer: BLUE CROSS/BLUE SHIELD | Source: Ambulatory Visit | Attending: Radiation Oncology | Admitting: Radiation Oncology

## 2017-07-27 DIAGNOSIS — C50412 Malignant neoplasm of upper-outer quadrant of left female breast: Secondary | ICD-10-CM | POA: Insufficient documentation

## 2017-07-27 DIAGNOSIS — Z510 Encounter for antineoplastic radiation therapy: Secondary | ICD-10-CM | POA: Diagnosis not present

## 2017-07-27 DIAGNOSIS — Z170 Estrogen receptor positive status [ER+]: Secondary | ICD-10-CM | POA: Diagnosis not present

## 2017-07-28 ENCOUNTER — Ambulatory Visit
Admission: RE | Admit: 2017-07-28 | Discharge: 2017-07-28 | Disposition: A | Discharge status: Home / Self Care | Payer: BLUE CROSS/BLUE SHIELD | Source: Ambulatory Visit | Attending: Radiation Oncology | Admitting: Radiation Oncology

## 2017-07-28 DIAGNOSIS — Z510 Encounter for antineoplastic radiation therapy: Secondary | ICD-10-CM | POA: Diagnosis not present

## 2017-07-28 DIAGNOSIS — Z170 Estrogen receptor positive status [ER+]: Secondary | ICD-10-CM | POA: Diagnosis not present

## 2017-07-28 DIAGNOSIS — C50412 Malignant neoplasm of upper-outer quadrant of left female breast: Secondary | ICD-10-CM | POA: Diagnosis not present

## 2017-07-29 ENCOUNTER — Ambulatory Visit
Admission: RE | Admit: 2017-07-29 | Discharge: 2017-07-29 | Disposition: A | Discharge status: Home / Self Care | Payer: BLUE CROSS/BLUE SHIELD | Source: Ambulatory Visit | Attending: Radiation Oncology | Admitting: Radiation Oncology

## 2017-07-29 DIAGNOSIS — C50412 Malignant neoplasm of upper-outer quadrant of left female breast: Secondary | ICD-10-CM | POA: Diagnosis not present

## 2017-07-29 DIAGNOSIS — Z510 Encounter for antineoplastic radiation therapy: Secondary | ICD-10-CM | POA: Diagnosis not present

## 2017-07-29 DIAGNOSIS — Z170 Estrogen receptor positive status [ER+]: Secondary | ICD-10-CM | POA: Diagnosis not present

## 2017-07-30 ENCOUNTER — Ambulatory Visit
Admission: RE | Admit: 2017-07-30 | Discharge: 2017-07-30 | Disposition: A | Discharge status: Home / Self Care | Payer: BLUE CROSS/BLUE SHIELD | Source: Ambulatory Visit | Attending: Radiation Oncology | Admitting: Radiation Oncology

## 2017-07-30 DIAGNOSIS — Z170 Estrogen receptor positive status [ER+]: Secondary | ICD-10-CM | POA: Diagnosis not present

## 2017-07-30 DIAGNOSIS — Z510 Encounter for antineoplastic radiation therapy: Secondary | ICD-10-CM | POA: Diagnosis not present

## 2017-07-30 DIAGNOSIS — C50412 Malignant neoplasm of upper-outer quadrant of left female breast: Secondary | ICD-10-CM | POA: Diagnosis not present

## 2017-08-02 ENCOUNTER — Ambulatory Visit
Admission: RE | Admit: 2017-08-02 | Discharge: 2017-08-02 | Disposition: A | Discharge status: Home / Self Care | Payer: BLUE CROSS/BLUE SHIELD | Source: Ambulatory Visit | Attending: Radiation Oncology | Admitting: Radiation Oncology

## 2017-08-02 DIAGNOSIS — C50412 Malignant neoplasm of upper-outer quadrant of left female breast: Secondary | ICD-10-CM | POA: Diagnosis not present

## 2017-08-02 DIAGNOSIS — Z510 Encounter for antineoplastic radiation therapy: Secondary | ICD-10-CM | POA: Diagnosis not present

## 2017-08-02 DIAGNOSIS — Z170 Estrogen receptor positive status [ER+]: Secondary | ICD-10-CM | POA: Diagnosis not present

## 2017-08-03 ENCOUNTER — Ambulatory Visit
Admission: RE | Admit: 2017-08-03 | Discharge: 2017-08-03 | Disposition: A | Discharge status: Home / Self Care | Payer: BLUE CROSS/BLUE SHIELD | Source: Ambulatory Visit | Attending: Radiation Oncology | Admitting: Radiation Oncology

## 2017-08-03 DIAGNOSIS — Z510 Encounter for antineoplastic radiation therapy: Secondary | ICD-10-CM | POA: Diagnosis not present

## 2017-08-03 DIAGNOSIS — Z170 Estrogen receptor positive status [ER+]: Secondary | ICD-10-CM | POA: Diagnosis not present

## 2017-08-03 DIAGNOSIS — C50412 Malignant neoplasm of upper-outer quadrant of left female breast: Secondary | ICD-10-CM | POA: Diagnosis not present

## 2017-08-04 ENCOUNTER — Ambulatory Visit
Admission: RE | Admit: 2017-08-04 | Discharge: 2017-08-04 | Disposition: A | Discharge status: Home / Self Care | Payer: BLUE CROSS/BLUE SHIELD | Source: Ambulatory Visit | Attending: Radiation Oncology | Admitting: Radiation Oncology

## 2017-08-04 DIAGNOSIS — Z170 Estrogen receptor positive status [ER+]: Secondary | ICD-10-CM | POA: Diagnosis not present

## 2017-08-04 DIAGNOSIS — Z510 Encounter for antineoplastic radiation therapy: Secondary | ICD-10-CM | POA: Diagnosis not present

## 2017-08-04 DIAGNOSIS — C50412 Malignant neoplasm of upper-outer quadrant of left female breast: Secondary | ICD-10-CM | POA: Diagnosis not present

## 2017-08-05 ENCOUNTER — Ambulatory Visit
Admission: RE | Admit: 2017-08-05 | Discharge: 2017-08-05 | Disposition: A | Discharge status: Home / Self Care | Payer: BLUE CROSS/BLUE SHIELD | Source: Ambulatory Visit | Attending: Radiation Oncology | Admitting: Radiation Oncology

## 2017-08-05 DIAGNOSIS — Z170 Estrogen receptor positive status [ER+]: Secondary | ICD-10-CM | POA: Diagnosis not present

## 2017-08-05 DIAGNOSIS — C50412 Malignant neoplasm of upper-outer quadrant of left female breast: Secondary | ICD-10-CM | POA: Diagnosis not present

## 2017-08-05 DIAGNOSIS — Z510 Encounter for antineoplastic radiation therapy: Secondary | ICD-10-CM | POA: Diagnosis not present

## 2017-08-06 ENCOUNTER — Ambulatory Visit
Admission: RE | Admit: 2017-08-06 | Discharge: 2017-08-06 | Disposition: A | Discharge status: Home / Self Care | Payer: BLUE CROSS/BLUE SHIELD | Source: Ambulatory Visit | Attending: Radiation Oncology | Admitting: Radiation Oncology

## 2017-08-06 DIAGNOSIS — C50412 Malignant neoplasm of upper-outer quadrant of left female breast: Secondary | ICD-10-CM | POA: Diagnosis not present

## 2017-08-06 DIAGNOSIS — Z510 Encounter for antineoplastic radiation therapy: Secondary | ICD-10-CM | POA: Diagnosis not present

## 2017-08-06 DIAGNOSIS — Z170 Estrogen receptor positive status [ER+]: Secondary | ICD-10-CM | POA: Diagnosis not present

## 2017-08-09 ENCOUNTER — Ambulatory Visit
Admission: RE | Admit: 2017-08-09 | Discharge: 2017-08-09 | Disposition: A | Discharge status: Home / Self Care | Payer: BLUE CROSS/BLUE SHIELD | Source: Ambulatory Visit | Attending: Radiation Oncology | Admitting: Radiation Oncology

## 2017-08-09 DIAGNOSIS — Z510 Encounter for antineoplastic radiation therapy: Secondary | ICD-10-CM | POA: Diagnosis not present

## 2017-08-09 DIAGNOSIS — C50412 Malignant neoplasm of upper-outer quadrant of left female breast: Secondary | ICD-10-CM | POA: Diagnosis not present

## 2017-08-09 DIAGNOSIS — Z170 Estrogen receptor positive status [ER+]: Secondary | ICD-10-CM | POA: Diagnosis not present

## 2017-08-10 ENCOUNTER — Ambulatory Visit
Admission: RE | Admit: 2017-08-10 | Discharge: 2017-08-10 | Disposition: A | Discharge status: Home / Self Care | Payer: BLUE CROSS/BLUE SHIELD | Source: Ambulatory Visit | Attending: Radiation Oncology | Admitting: Radiation Oncology

## 2017-08-10 DIAGNOSIS — Z510 Encounter for antineoplastic radiation therapy: Secondary | ICD-10-CM | POA: Diagnosis not present

## 2017-08-10 DIAGNOSIS — Z170 Estrogen receptor positive status [ER+]: Secondary | ICD-10-CM | POA: Diagnosis not present

## 2017-08-10 DIAGNOSIS — C50412 Malignant neoplasm of upper-outer quadrant of left female breast: Secondary | ICD-10-CM | POA: Diagnosis not present

## 2017-08-11 ENCOUNTER — Ambulatory Visit
Admission: RE | Admit: 2017-08-11 | Discharge: 2017-08-11 | Disposition: A | Discharge status: Home / Self Care | Payer: BLUE CROSS/BLUE SHIELD | Source: Ambulatory Visit | Attending: Radiation Oncology | Admitting: Radiation Oncology

## 2017-08-11 DIAGNOSIS — Z510 Encounter for antineoplastic radiation therapy: Secondary | ICD-10-CM | POA: Diagnosis not present

## 2017-08-11 DIAGNOSIS — Z170 Estrogen receptor positive status [ER+]: Secondary | ICD-10-CM | POA: Diagnosis not present

## 2017-08-11 DIAGNOSIS — C50412 Malignant neoplasm of upper-outer quadrant of left female breast: Secondary | ICD-10-CM | POA: Diagnosis not present

## 2017-08-12 ENCOUNTER — Ambulatory Visit
Admission: RE | Admit: 2017-08-12 | Discharge: 2017-08-12 | Disposition: A | Discharge status: Home / Self Care | Payer: BLUE CROSS/BLUE SHIELD | Source: Ambulatory Visit | Attending: Radiation Oncology | Admitting: Radiation Oncology

## 2017-08-12 DIAGNOSIS — Z170 Estrogen receptor positive status [ER+]: Secondary | ICD-10-CM | POA: Diagnosis not present

## 2017-08-12 DIAGNOSIS — Z510 Encounter for antineoplastic radiation therapy: Secondary | ICD-10-CM | POA: Diagnosis not present

## 2017-08-12 DIAGNOSIS — C50412 Malignant neoplasm of upper-outer quadrant of left female breast: Secondary | ICD-10-CM | POA: Diagnosis not present

## 2017-08-13 ENCOUNTER — Ambulatory Visit
Admission: RE | Admit: 2017-08-13 | Discharge: 2017-08-13 | Disposition: A | Discharge status: Home / Self Care | Payer: BLUE CROSS/BLUE SHIELD | Source: Ambulatory Visit | Attending: Radiation Oncology | Admitting: Radiation Oncology

## 2017-08-13 ENCOUNTER — Ambulatory Visit: Payer: BLUE CROSS/BLUE SHIELD | Admitting: Radiation Oncology

## 2017-08-13 DIAGNOSIS — Z170 Estrogen receptor positive status [ER+]: Secondary | ICD-10-CM | POA: Diagnosis not present

## 2017-08-13 DIAGNOSIS — C50412 Malignant neoplasm of upper-outer quadrant of left female breast: Secondary | ICD-10-CM | POA: Diagnosis not present

## 2017-08-13 DIAGNOSIS — Z510 Encounter for antineoplastic radiation therapy: Secondary | ICD-10-CM | POA: Diagnosis not present

## 2017-08-16 ENCOUNTER — Ambulatory Visit
Admission: RE | Admit: 2017-08-16 | Discharge: 2017-08-16 | Disposition: A | Discharge status: Home / Self Care | Payer: BLUE CROSS/BLUE SHIELD | Source: Ambulatory Visit | Attending: Radiation Oncology | Admitting: Radiation Oncology

## 2017-08-16 DIAGNOSIS — Z510 Encounter for antineoplastic radiation therapy: Secondary | ICD-10-CM | POA: Diagnosis not present

## 2017-08-16 DIAGNOSIS — Z170 Estrogen receptor positive status [ER+]: Secondary | ICD-10-CM | POA: Diagnosis not present

## 2017-08-16 DIAGNOSIS — C50412 Malignant neoplasm of upper-outer quadrant of left female breast: Secondary | ICD-10-CM | POA: Diagnosis not present

## 2017-08-17 ENCOUNTER — Ambulatory Visit
Admission: RE | Admit: 2017-08-17 | Discharge: 2017-08-17 | Disposition: A | Discharge status: Home / Self Care | Payer: BLUE CROSS/BLUE SHIELD | Source: Ambulatory Visit | Attending: Radiation Oncology | Admitting: Radiation Oncology

## 2017-08-17 DIAGNOSIS — Z170 Estrogen receptor positive status [ER+]: Secondary | ICD-10-CM | POA: Diagnosis not present

## 2017-08-17 DIAGNOSIS — Z510 Encounter for antineoplastic radiation therapy: Secondary | ICD-10-CM | POA: Diagnosis not present

## 2017-08-17 DIAGNOSIS — C50412 Malignant neoplasm of upper-outer quadrant of left female breast: Secondary | ICD-10-CM | POA: Diagnosis not present

## 2017-08-18 ENCOUNTER — Ambulatory Visit
Admission: RE | Admit: 2017-08-18 | Discharge: 2017-08-18 | Disposition: A | Discharge status: Home / Self Care | Payer: BLUE CROSS/BLUE SHIELD | Source: Ambulatory Visit | Attending: Radiation Oncology | Admitting: Radiation Oncology

## 2017-08-18 DIAGNOSIS — C50412 Malignant neoplasm of upper-outer quadrant of left female breast: Secondary | ICD-10-CM | POA: Diagnosis not present

## 2017-08-18 DIAGNOSIS — Z510 Encounter for antineoplastic radiation therapy: Secondary | ICD-10-CM | POA: Diagnosis not present

## 2017-08-18 DIAGNOSIS — Z170 Estrogen receptor positive status [ER+]: Secondary | ICD-10-CM | POA: Diagnosis not present

## 2017-08-19 ENCOUNTER — Ambulatory Visit
Admission: RE | Admit: 2017-08-19 | Discharge: 2017-08-19 | Disposition: A | Discharge status: Home / Self Care | Payer: BLUE CROSS/BLUE SHIELD | Source: Ambulatory Visit | Attending: Radiation Oncology | Admitting: Radiation Oncology

## 2017-08-19 DIAGNOSIS — Z170 Estrogen receptor positive status [ER+]: Secondary | ICD-10-CM | POA: Diagnosis not present

## 2017-08-19 DIAGNOSIS — C50412 Malignant neoplasm of upper-outer quadrant of left female breast: Secondary | ICD-10-CM | POA: Diagnosis not present

## 2017-08-19 DIAGNOSIS — Z510 Encounter for antineoplastic radiation therapy: Secondary | ICD-10-CM | POA: Diagnosis not present

## 2017-08-20 ENCOUNTER — Ambulatory Visit
Admission: RE | Admit: 2017-08-20 | Discharge: 2017-08-20 | Disposition: A | Discharge status: Home / Self Care | Payer: BLUE CROSS/BLUE SHIELD | Source: Ambulatory Visit | Attending: Radiation Oncology | Admitting: Radiation Oncology

## 2017-08-20 DIAGNOSIS — Z510 Encounter for antineoplastic radiation therapy: Secondary | ICD-10-CM | POA: Diagnosis not present

## 2017-08-20 DIAGNOSIS — C50412 Malignant neoplasm of upper-outer quadrant of left female breast: Secondary | ICD-10-CM | POA: Diagnosis not present

## 2017-08-20 DIAGNOSIS — Z170 Estrogen receptor positive status [ER+]: Secondary | ICD-10-CM | POA: Diagnosis not present

## 2017-08-23 ENCOUNTER — Ambulatory Visit
Admission: RE | Admit: 2017-08-23 | Discharge: 2017-08-23 | Disposition: A | Discharge status: Home / Self Care | Payer: BLUE CROSS/BLUE SHIELD | Source: Ambulatory Visit | Attending: Radiation Oncology | Admitting: Radiation Oncology

## 2017-08-23 DIAGNOSIS — C50412 Malignant neoplasm of upper-outer quadrant of left female breast: Secondary | ICD-10-CM | POA: Diagnosis not present

## 2017-08-23 DIAGNOSIS — Z510 Encounter for antineoplastic radiation therapy: Secondary | ICD-10-CM | POA: Diagnosis not present

## 2017-08-23 DIAGNOSIS — Z170 Estrogen receptor positive status [ER+]: Secondary | ICD-10-CM | POA: Diagnosis not present

## 2017-08-24 ENCOUNTER — Ambulatory Visit
Admission: RE | Admit: 2017-08-24 | Discharge: 2017-08-24 | Disposition: A | Discharge status: Home / Self Care | Payer: BLUE CROSS/BLUE SHIELD | Source: Ambulatory Visit | Attending: Radiation Oncology | Admitting: Radiation Oncology

## 2017-08-24 DIAGNOSIS — C50412 Malignant neoplasm of upper-outer quadrant of left female breast: Secondary | ICD-10-CM | POA: Diagnosis not present

## 2017-08-24 DIAGNOSIS — Z170 Estrogen receptor positive status [ER+]: Secondary | ICD-10-CM | POA: Diagnosis not present

## 2017-08-24 DIAGNOSIS — Z510 Encounter for antineoplastic radiation therapy: Secondary | ICD-10-CM | POA: Diagnosis not present

## 2017-08-25 ENCOUNTER — Ambulatory Visit
Admission: RE | Admit: 2017-08-25 | Discharge: 2017-08-25 | Disposition: A | Discharge status: Home / Self Care | Payer: BLUE CROSS/BLUE SHIELD | Source: Ambulatory Visit | Attending: Radiation Oncology | Admitting: Radiation Oncology

## 2017-08-25 DIAGNOSIS — C50412 Malignant neoplasm of upper-outer quadrant of left female breast: Secondary | ICD-10-CM | POA: Diagnosis not present

## 2017-08-25 DIAGNOSIS — Z510 Encounter for antineoplastic radiation therapy: Secondary | ICD-10-CM | POA: Diagnosis not present

## 2017-08-25 DIAGNOSIS — Z170 Estrogen receptor positive status [ER+]: Secondary | ICD-10-CM | POA: Diagnosis not present

## 2017-08-25 NOTE — Progress Notes (Signed)
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 Truitt Merle, MD. It was created on her behalf by Joslyn Devon, a trained medical scribe. The creation of this record is based on the scribe's personal observations and the provider's statements to them.    I have reviewed the above documentation for accuracy and completeness, and I agree with the above.   Truitt Merle  08/26/2017

## 2017-08-26 ENCOUNTER — Encounter: Payer: Self-pay | Admitting: Hematology

## 2017-08-26 ENCOUNTER — Ambulatory Visit
Admission: RE | Admit: 2017-08-26 | Discharge: 2017-08-26 | Disposition: A | Discharge status: Home / Self Care | Payer: BLUE CROSS/BLUE SHIELD | Source: Ambulatory Visit | Attending: Radiation Oncology | Admitting: Radiation Oncology

## 2017-08-26 ENCOUNTER — Encounter: Payer: Self-pay | Admitting: Radiation Oncology

## 2017-08-26 ENCOUNTER — Inpatient Hospital Stay: Payer: BLUE CROSS/BLUE SHIELD | Attending: Hematology

## 2017-08-26 ENCOUNTER — Inpatient Hospital Stay (HOSPITAL_BASED_OUTPATIENT_CLINIC_OR_DEPARTMENT_OTHER): Payer: BLUE CROSS/BLUE SHIELD | Admitting: Hematology

## 2017-08-26 ENCOUNTER — Ambulatory Visit: Payer: BLUE CROSS/BLUE SHIELD | Admitting: Hematology

## 2017-08-26 ENCOUNTER — Other Ambulatory Visit: Payer: BLUE CROSS/BLUE SHIELD

## 2017-08-26 VITALS — BP 101/69 | HR 88 | Temp 98.1°F | Resp 20 | Ht 64.5 in | Wt 133.7 lb

## 2017-08-26 DIAGNOSIS — C50412 Malignant neoplasm of upper-outer quadrant of left female breast: Secondary | ICD-10-CM | POA: Diagnosis not present

## 2017-08-26 DIAGNOSIS — Z170 Estrogen receptor positive status [ER+]: Secondary | ICD-10-CM | POA: Insufficient documentation

## 2017-08-26 DIAGNOSIS — F329 Major depressive disorder, single episode, unspecified: Secondary | ICD-10-CM | POA: Insufficient documentation

## 2017-08-26 DIAGNOSIS — E039 Hypothyroidism, unspecified: Secondary | ICD-10-CM

## 2017-08-26 DIAGNOSIS — Z510 Encounter for antineoplastic radiation therapy: Secondary | ICD-10-CM | POA: Diagnosis not present

## 2017-08-26 LAB — CBC WITH DIFFERENTIAL/PLATELET
Basophils Absolute: 0 10*3/uL (ref 0.0–0.1)
Basophils Relative: 1 %
EOS ABS: 0.1 10*3/uL (ref 0.0–0.5)
EOS PCT: 3 %
HCT: 37.5 % (ref 34.8–46.6)
Hemoglobin: 13 g/dL (ref 11.6–15.9)
LYMPHS PCT: 19 %
Lymphs Abs: 0.8 10*3/uL — ABNORMAL LOW (ref 0.9–3.3)
MCH: 31.2 pg (ref 25.1–34.0)
MCHC: 34.7 g/dL (ref 31.5–36.0)
MCV: 89.9 fL (ref 79.5–101.0)
MONO ABS: 0.5 10*3/uL (ref 0.1–0.9)
Monocytes Relative: 13 %
Neutro Abs: 2.6 10*3/uL (ref 1.5–6.5)
Neutrophils Relative %: 64 %
PLATELETS: 212 10*3/uL (ref 145–400)
RBC: 4.17 MIL/uL (ref 3.70–5.45)
RDW: 12.8 % (ref 11.2–14.5)
WBC: 4 10*3/uL (ref 3.9–10.3)

## 2017-08-26 LAB — COMPREHENSIVE METABOLIC PANEL
ALBUMIN: 4 g/dL (ref 3.5–5.0)
ALK PHOS: 74 U/L (ref 40–150)
ALT: 7 U/L (ref 0–55)
AST: 19 U/L (ref 5–34)
Anion gap: 9 (ref 3–11)
BILIRUBIN TOTAL: 0.6 mg/dL (ref 0.2–1.2)
BUN: 14 mg/dL (ref 7–26)
CALCIUM: 9.5 mg/dL (ref 8.4–10.4)
CO2: 26 mmol/L (ref 22–29)
CREATININE: 0.77 mg/dL (ref 0.60–1.10)
Chloride: 106 mmol/L (ref 98–109)
GFR calc Af Amer: >60 mL/min (ref >60)
GFR calc non Af Amer: >60 mL/min (ref >60)
GLUCOSE: 97 mg/dL (ref 70–140)
POTASSIUM: 4.2 mmol/L (ref 3.5–5.1)
Sodium: 141 mmol/L (ref 136–145)
Total Protein: 6.9 g/dL (ref 6.4–8.3)

## 2017-08-27 ENCOUNTER — Telehealth: Payer: Self-pay | Admitting: Hematology

## 2017-08-27 NOTE — Telephone Encounter (Signed)
Letter/Calendar mailed to patient with updated appointments per 2/28 sch msg °

## 2017-09-06 ENCOUNTER — Other Ambulatory Visit: Payer: BLUE CROSS/BLUE SHIELD

## 2017-09-06 ENCOUNTER — Ambulatory Visit: Payer: BLUE CROSS/BLUE SHIELD | Admitting: Hematology

## 2017-09-22 NOTE — Progress Notes (Signed)
  Radiation Oncology         [phone] ________________________________  Name: Angela [name] MRN: [id number]  Date: 08/26/2017  DOB: 1956-03-15  End of Treatment Note  Diagnosis:   62 y.o. female with Stage IIB, cT2N1M0, ympT1aN1aM0, ER positive, invasive ductal carcinoma of the left breast   Indication for treatment:  Curative       Radiation treatment dates:   07/13/2017 - 08/26/2017  Site/dose:   The patient initially received a dose of 50.4 Gy in 28 fractions to the left breast, left supraclavicular region, and left posterior axillary region boost using whole-breast tangent fields. This was delivered using a 3-D conformal technique. The patient then received a boost to the seroma. This delivered an additional 10 Gy in 5 fractions using a 3-D technique. The total dose was 60.4 Gy.  Narrative: The patient tolerated radiation treatment relatively well.   The patient had some expected skin irritation as she progressed during treatment. Moist desquamation was not present at the end of treatment.  Plan: The patient has completed radiation treatment. The patient will return to radiation oncology clinic for routine followup in one month. I advised the patient to call or return sooner if they have any questions or concerns related to their recovery or treatment. ________________________________  Jodelle Gross, MD, PhD  This document serves as a record of services personally performed by Kyung Rudd, MD. It was created on his behalf by Rae Lips, a trained medical scribe. The creation of this record is based on the scribe's personal observations and the provider's statements to them. This document has been checked and approved by the attending provider.

## 2017-09-28 ENCOUNTER — Encounter: Payer: Self-pay | Admitting: Physician Assistant

## 2017-11-10 ENCOUNTER — Telehealth: Payer: Self-pay

## 2017-11-10 NOTE — Telephone Encounter (Signed)
LVM reminding patient of SCP visit on 11/23/17 at 9 am with NP.  Left center number for call back with questions.
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
## 2017-11-23 ENCOUNTER — Inpatient Hospital Stay: Payer: BLUE CROSS/BLUE SHIELD | Attending: Adult Health | Admitting: Adult Health

## 2018-02-16 NOTE — Progress Notes (Signed)
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 f/u in 6 months  -Bilateral diagnostic mammogram and bone density scan at the [facility] image in 2 weeks.   All questions were answered. The patient knows to call the clinic with any problems, questions or concerns.  I spent 20 minutes counseling the patient face to face. The total time spent in the appointment was 25 minutes and more than 50% was on counseling.   Truitt Merle  02/23/2018

## 2018-02-23 ENCOUNTER — Telehealth: Payer: Self-pay

## 2018-02-23 ENCOUNTER — Inpatient Hospital Stay: Payer: BLUE CROSS/BLUE SHIELD | Attending: Adult Health

## 2018-02-23 ENCOUNTER — Telehealth: Payer: Self-pay | Admitting: Hematology

## 2018-02-23 ENCOUNTER — Encounter: Payer: Self-pay | Admitting: Hematology

## 2018-02-23 ENCOUNTER — Inpatient Hospital Stay (HOSPITAL_BASED_OUTPATIENT_CLINIC_OR_DEPARTMENT_OTHER): Payer: BLUE CROSS/BLUE SHIELD | Admitting: Hematology

## 2018-02-23 VITALS — BP 130/85 | HR 91 | Temp 97.5°F | Resp 20 | Ht 64.5 in | Wt 135.5 lb

## 2018-02-23 DIAGNOSIS — Z803 Family history of malignant neoplasm of breast: Secondary | ICD-10-CM | POA: Insufficient documentation

## 2018-02-23 DIAGNOSIS — C50412 Malignant neoplasm of upper-outer quadrant of left female breast: Secondary | ICD-10-CM

## 2018-02-23 DIAGNOSIS — C773 Secondary and unspecified malignant neoplasm of axilla and upper limb lymph nodes: Secondary | ICD-10-CM | POA: Diagnosis not present

## 2018-02-23 DIAGNOSIS — E039 Hypothyroidism, unspecified: Secondary | ICD-10-CM | POA: Insufficient documentation

## 2018-02-23 DIAGNOSIS — Z170 Estrogen receptor positive status [ER+]: Secondary | ICD-10-CM | POA: Diagnosis not present

## 2018-02-23 DIAGNOSIS — E2839 Other primary ovarian failure: Secondary | ICD-10-CM

## 2018-02-23 LAB — CBC WITH DIFFERENTIAL/PLATELET
Basophils Absolute: 0 10*3/uL (ref 0.0–0.1)
Basophils Relative: 1 %
EOS ABS: 0.1 10*3/uL (ref 0.0–0.5)
EOS PCT: 1 %
HCT: 36.3 % (ref 34.8–46.6)
HEMOGLOBIN: 12.3 g/dL (ref 11.6–15.9)
Lymphocytes Relative: 28 %
Lymphs Abs: 1.1 10*3/uL (ref 0.9–3.3)
MCH: 31.2 pg (ref 25.1–34.0)
MCHC: 33.9 g/dL (ref 31.5–36.0)
MCV: 92.1 fL (ref 79.5–101.0)
MONO ABS: 0.5 10*3/uL (ref 0.1–0.9)
MONOS PCT: 12 %
NEUTROS PCT: 58 %
Neutro Abs: 2.4 10*3/uL (ref 1.5–6.5)
PLATELETS: 230 10*3/uL (ref 145–400)
RBC: 3.95 MIL/uL (ref 3.70–5.45)
RDW: 13 % (ref 11.2–14.5)
WBC: 4.1 10*3/uL (ref 3.9–10.3)

## 2018-02-23 LAB — COMPREHENSIVE METABOLIC PANEL
ALK PHOS: 92 U/L (ref 38–126)
AST: 20 U/L (ref 15–41)
Albumin: 3.9 g/dL (ref 3.5–5.0)
Anion gap: 7 (ref 5–15)
BUN: 14 mg/dL (ref 8–23)
CALCIUM: 9.3 mg/dL (ref 8.9–10.3)
CO2: 28 mmol/L (ref 22–32)
CREATININE: 0.77 mg/dL (ref 0.44–1.00)
Chloride: 105 mmol/L (ref 98–111)
GFR calc non Af Amer: >60 mL/min (ref >60)
Glucose, Bld: 102 mg/dL — ABNORMAL HIGH (ref 70–99)
Potassium: 4.2 mmol/L (ref 3.5–5.1)
SODIUM: 140 mmol/L (ref 135–145)
Total Bilirubin: 0.5 mg/dL (ref 0.3–1.2)
Total Protein: 7.1 g/dL (ref 6.5–8.1)

## 2018-02-23 NOTE — Telephone Encounter (Signed)
Patient had left message regarding a rejection of charges from [facility], I called patient back left voice message to contact Gaspar Bidding in [place] for help.

## 2018-02-23 NOTE — Telephone Encounter (Signed)
Gave Pt calendar and avs

## 2018-03-09 ENCOUNTER — Telehealth: Payer: Self-pay | Admitting: Physician Assistant

## 2018-03-09 DIAGNOSIS — Z0000 Encounter for general adult medical examination without abnormal findings: Secondary | ICD-10-CM

## 2018-03-09 NOTE — Telephone Encounter (Signed)
Copied from [id number]. Topic: Quick Communication - Rx Refill/Question >> Mar 09, 2018  1:31 PM Leward Quan A wrote: Medication: thyroid (ARMOUR THYROID) 15 MG tablet  Has the patient contacted their pharmacy? yes (Agent: If no, request that the patient contact the pharmacy for the refill.) (Agent: If yes, when and what did the pharmacy advise?)  Preferred Pharmacy (with phone number or street name):[id number] - , [place] - [place] [phone] (Phone) [phone] (Fax)     Agent: Please be advised that RX refills may take up to 3 business days. We ask that you follow-up with your pharmacy.

## 2018-03-10 NOTE — Telephone Encounter (Signed)
Please see note below. 

## 2018-03-13 MED ORDER — THYROID 15 MG PO TABS: 15.0000 mg | ORAL_TABLET | Freq: Every day | ORAL | 1 refills | Status: DC

## 2018-04-25 ENCOUNTER — Other Ambulatory Visit: Payer: BLUE CROSS/BLUE SHIELD

## 2018-05-05 ENCOUNTER — Ambulatory Visit
Admission: RE | Admit: 2018-05-05 | Discharge: 2018-05-05 | Disposition: A | Discharge status: Home / Self Care | Payer: BLUE CROSS/BLUE SHIELD | Source: Ambulatory Visit | Attending: Hematology | Admitting: Hematology

## 2018-05-05 DIAGNOSIS — M810 Age-related osteoporosis without current pathological fracture: Secondary | ICD-10-CM | POA: Diagnosis not present

## 2018-05-05 DIAGNOSIS — C50412 Malignant neoplasm of upper-outer quadrant of left female breast: Secondary | ICD-10-CM

## 2018-05-05 DIAGNOSIS — R922 Inconclusive mammogram: Secondary | ICD-10-CM | POA: Diagnosis not present

## 2018-05-05 DIAGNOSIS — Z780 Asymptomatic menopausal state: Secondary | ICD-10-CM | POA: Diagnosis not present

## 2018-05-05 DIAGNOSIS — M85832 Other specified disorders of bone density and structure, left forearm: Secondary | ICD-10-CM | POA: Diagnosis not present

## 2018-05-05 DIAGNOSIS — E2839 Other primary ovarian failure: Secondary | ICD-10-CM

## 2018-05-05 DIAGNOSIS — Z170 Estrogen receptor positive status [ER+]: Principal | ICD-10-CM

## 2018-05-27 ENCOUNTER — Telehealth: Payer: Self-pay | Admitting: Family Medicine

## 2018-05-27 NOTE — Telephone Encounter (Signed)
LVM for patient to call back in and get her appointment that is scheduled on 07/01/18 rescheduled due to Dr Brigitte Pulse being out of the office that day.  Please reschedule her whenever works for the patient.

## 2018-06-02 ENCOUNTER — Telehealth: Payer: Self-pay | Admitting: Nurse Practitioner

## 2018-06-02 NOTE — Telephone Encounter (Signed)
Called patient to discuss DEXA report and recommendation to begin bisphosphonate. No answer. I left my name and number on her identifying voicemail requesting she call back.  Cira Rue, NP 06/02/18

## 2018-06-03 ENCOUNTER — Telehealth: Payer: Self-pay | Admitting: Nurse Practitioner

## 2018-06-03 NOTE — Telephone Encounter (Signed)
2nd attempt to reach patient to discuss DEXA. Will try again to connect at a later date.  Cira Rue, NP 06/03/18

## 2018-06-08 ENCOUNTER — Telehealth: Payer: Self-pay | Admitting: Nurse Practitioner

## 2018-06-08 NOTE — Telephone Encounter (Signed)
3rd attempt to reach patient to discuss DEXA and recommendations. No answer. I left name and call back number.  Cira Rue, NP  06/08/18

## 2018-06-10 ENCOUNTER — Telehealth: Payer: Self-pay

## 2018-06-10 NOTE — Telephone Encounter (Signed)
-----   Message from Alla Feeling, NP sent at 06/08/2018 10:34 AM EST ----- I have been unable to reach her x3. Nursing, please try again to reach her. Her next f/u is 07/2018. Thanks, Regan Rakers

## 2018-06-10 NOTE — Telephone Encounter (Signed)
Spoke with patient regarding bone density scan results and the recommendation that she get Zometa infusions.  Patient spoke at length about she isn't going to get any additional medical treatments at this time. She will try calcium and Vitamin D supplements as well as weight bearing exercises.

## 2018-07-01 ENCOUNTER — Encounter: Payer: BLUE CROSS/BLUE SHIELD | Admitting: Family Medicine

## 2018-07-03 IMAGING — CT CT CHEST W/ CM
2 of 5 series · 14 of 36 positions shown, 17 images · IV contrast (ISOVUE 300)
Comparison: Today's bone scan, dictated separately. Chest
radiograph 12/10/2016. Breast MR of 02/13/2017. No prior CTs.

CLINICAL DATA: Left breast cancer diagnosed in [REDACTED]. Finished
chemotherapy in [REDACTED].

EXAM:
CT CHEST, ABDOMEN, AND PELVIS WITH CONTRAST
TECHNIQUE: Multidetector CT imaging of the chest, abdomen and pelvis was
performed following the standard protocol during bolus
administration of intravenous contrast.
CONTRAST:  100mL [id number] IOPAMIDOL ([id number]) INJECTION 61%

[Series 2: cap with · axial · 0.69mm/px · z∈[-603,-63]mm · 11 of 130 slices shown, 14 images]
[im 11/130  mediastinal]
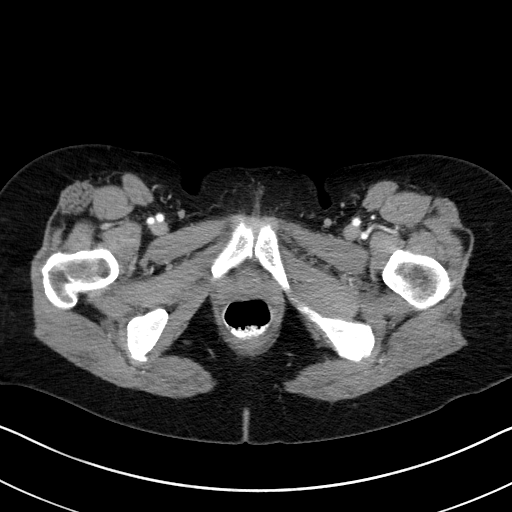
[im 11/130  lung]
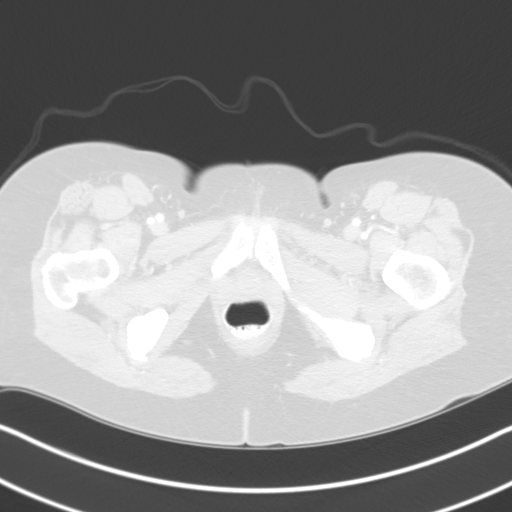
[im 22/130  lung]
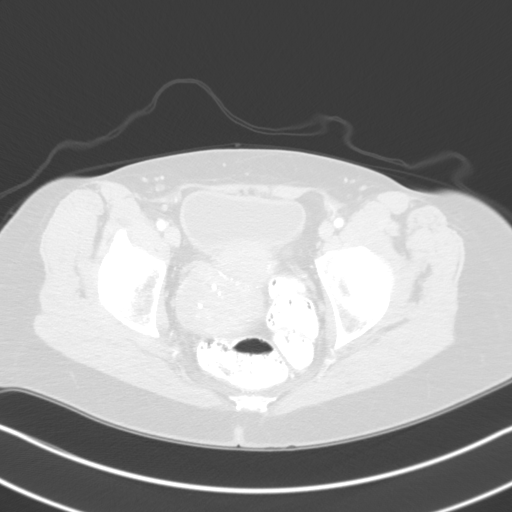
[im 33/130  lung]
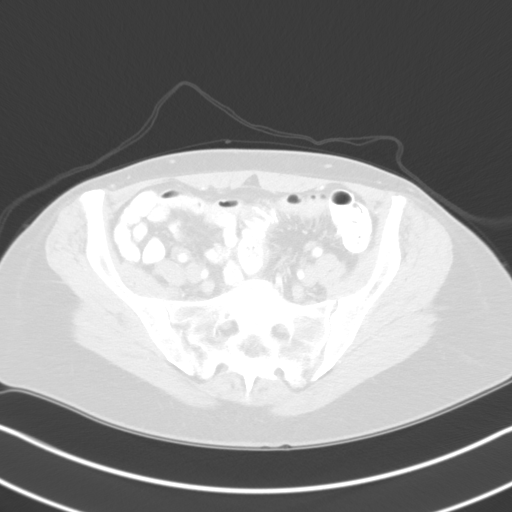
[im 44/130  lung]
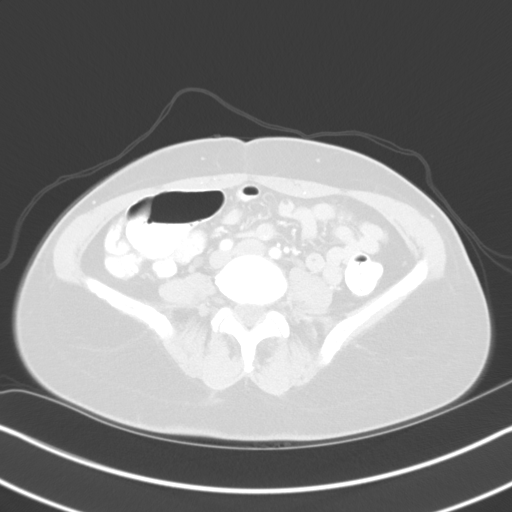
[im 54/130  mediastinal]
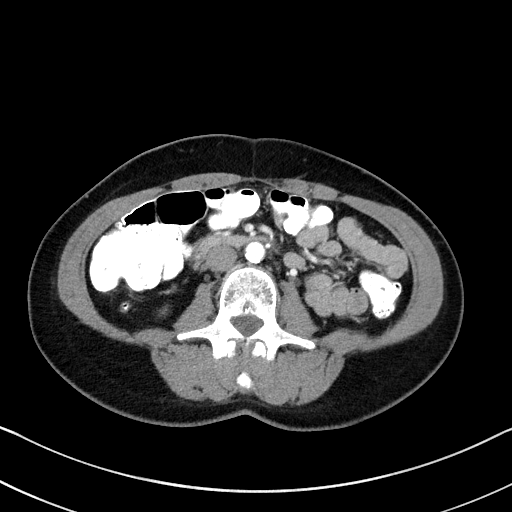
[im 54/130  lung]
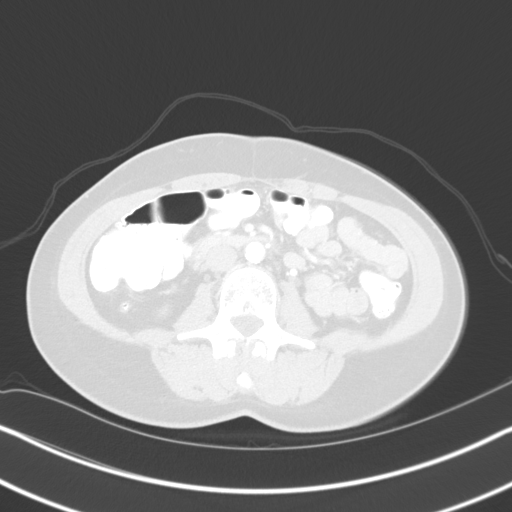
[im 65/130  lung]
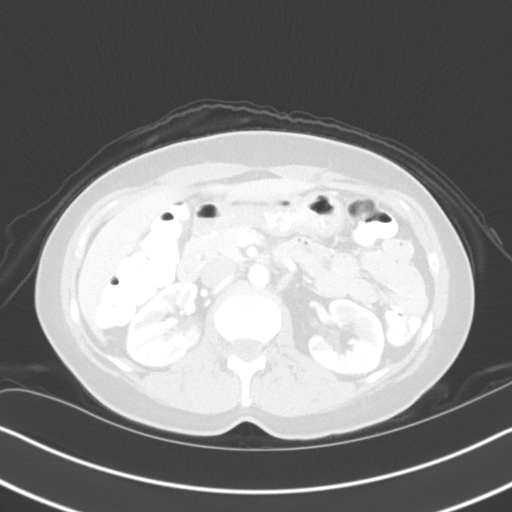
[im 76/130  lung]
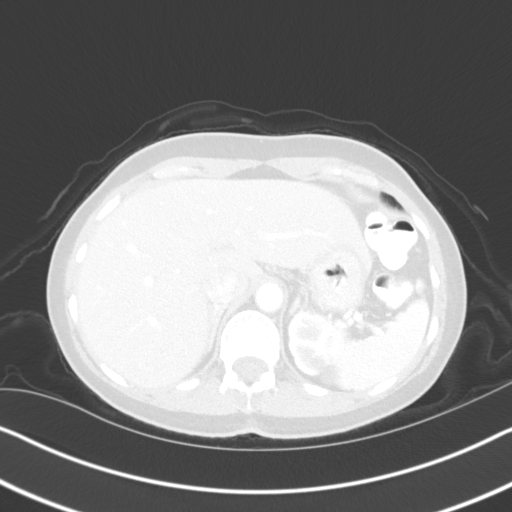
[im 87/130  lung]
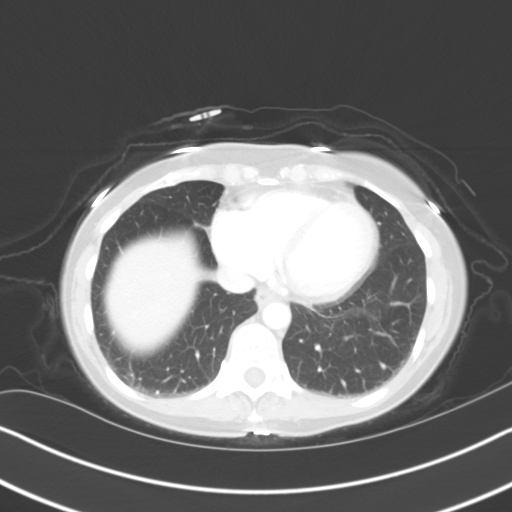
[im 97/130  mediastinal]
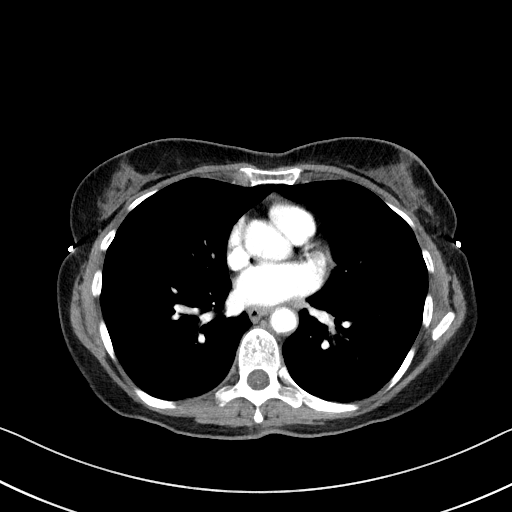
[im 97/130  lung]
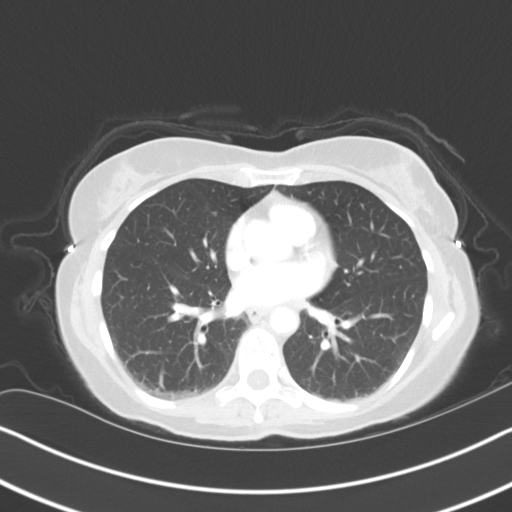
[im 108/130  lung]
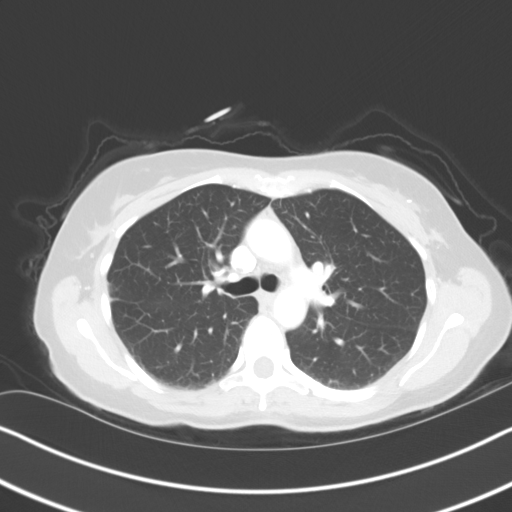
[im 119/130  lung]
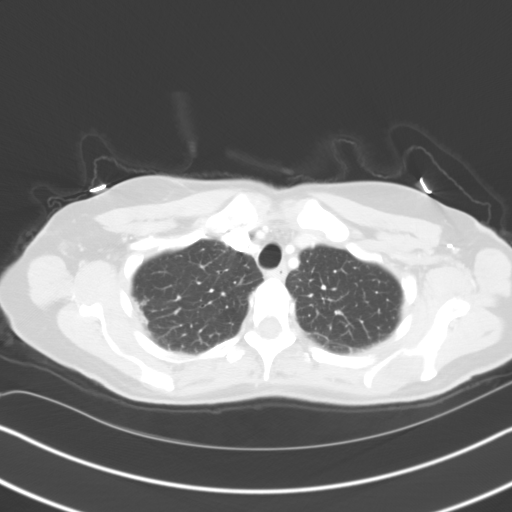

[Series 4: coronals · coronal · 0.90mm/px · 3 of 95 slices shown]
[im 19/95  lung]
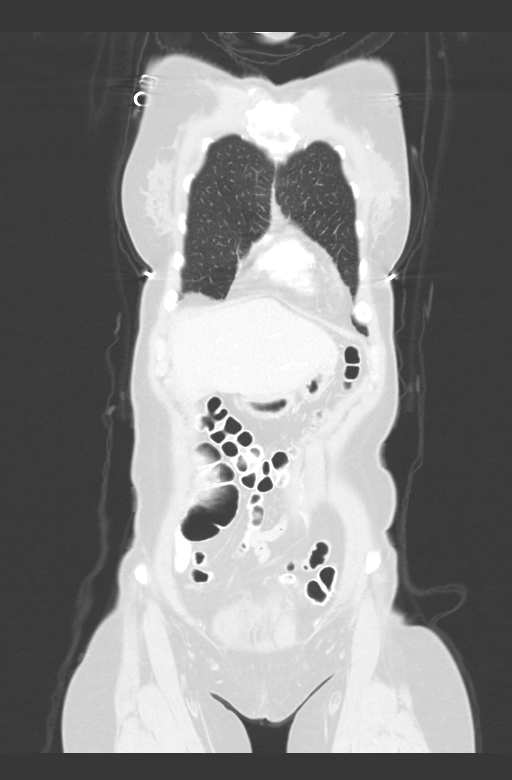
[im 38/95  lung]
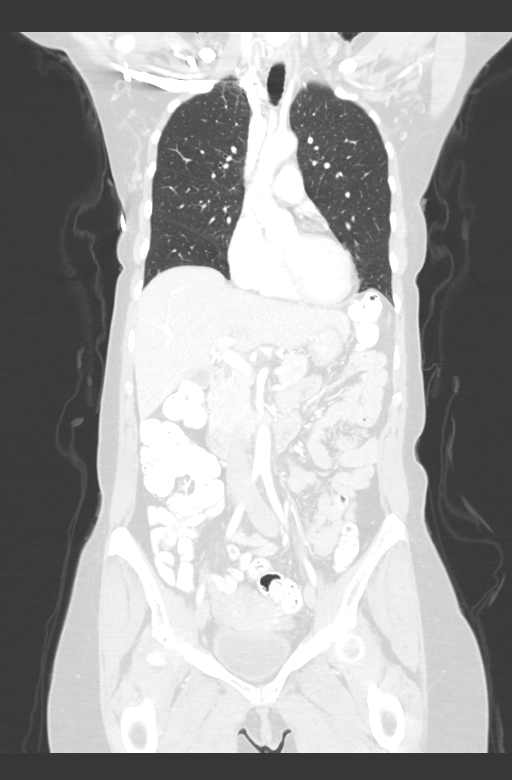
[im 57/95  lung]
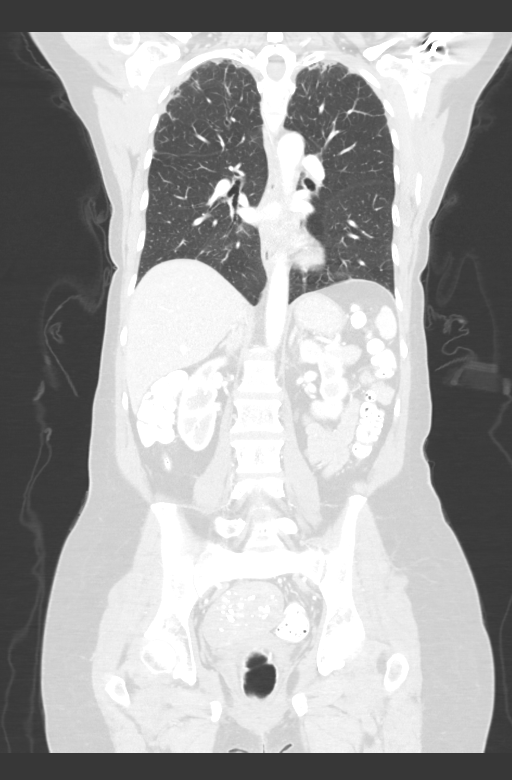

[14 of 36 positions shown; findings below may reference images not displayed]

FINDINGS: CT CHEST FINDINGS

Cardiovascular: Aortic atherosclerosis. Normal heart size, without
pericardial effusion. No central pulmonary embolism, on this
non-dedicated study.

Mediastinum/Nodes: Left-sided thyroidectomy. 6 mm nonspecific
right-sided thyroid hypoattenuating nodule. No supraclavicular
adenopathy. No axillary or subpectoral adenopathy. Left axillary
node dissection. No mediastinal or hilar adenopathy. No internal
mammary adenopathy.

Lungs/Pleura: No pleural fluid. Biapical pleural-parenchymal
scarring.

Left base scarring or subsegmental atelectasis.

Musculoskeletal: Left lumpectomy with lateral left breast
low-density collection of 2.9 x 1.4 cm on image 28/series 2. No
acute osseous abnormality. Left rotator cuff repair.

CT ABDOMEN PELVIS FINDINGS

Hepatobiliary: Normal liver. Normal gallbladder, without biliary
ductal dilatation.

Pancreas: Normal, without mass or ductal dilatation.

Spleen: Normal in size, without focal abnormality.

Adrenals/Urinary Tract: Normal adrenal glands. Multiple low-density
left renal lesions. Smaller lesions are fluid density, consistent
with cysts. The largest lesion measures 1.7 cm and 19 HU, favoring a
cyst or minimally complex cyst. Other bilateral too small to
characterize lesions. No hydronephrosis. Normal urinary bladder.

Stomach/Bowel: Normal stomach, without wall thickening. Normal small
bowel.

Vascular/Lymphatic: Aortic and branch vessel atherosclerosis. No
abdominopelvic adenopathy.

Reproductive: Multiple, partially calcified uterine fibroids. No
adnexal mass.

Other: No significant free fluid. No evidence of omental or
peritoneal disease.

Musculoskeletal: No acute osseous abnormality. Mild disc bulge at
L4-5
IMPRESSION: 1. Status post left lumpectomy and axillary node dissection with
postoperative seroma within the left breast.
2. No evidence of metastatic disease in the chest, abdomen, or
pelvis.
3.  Aortic Atherosclerosis ([id number]).
4. Uterine fibroids.

## 2018-07-11 ENCOUNTER — Telehealth: Payer: Self-pay | Admitting: Family Medicine

## 2018-07-11 NOTE — Telephone Encounter (Signed)
LVM for pt to call the office and reschedule the appt with Dr. Brigitte Pulse. Dr. Brigitte Pulse is out of the office with an injury for the next 6 weeks. Best case scenario, she will be returning the first week of March. When pt calls back, please either reschedule Dr. Brigitte Pulse for the first week of March (or after) or with a different provider if the appt needs to happen sooner. Thank you!

## 2018-07-12 ENCOUNTER — Encounter: Payer: BLUE CROSS/BLUE SHIELD | Admitting: Family Medicine

## 2018-07-20 ENCOUNTER — Other Ambulatory Visit: Payer: Self-pay | Admitting: Hematology

## 2018-08-18 ENCOUNTER — Other Ambulatory Visit: Payer: Self-pay

## 2018-08-18 ENCOUNTER — Ambulatory Visit (INDEPENDENT_AMBULATORY_CARE_PROVIDER_SITE_OTHER): Payer: Managed Care, Other (non HMO) | Admitting: Family Medicine

## 2018-08-18 ENCOUNTER — Encounter: Payer: Self-pay | Admitting: Family Medicine

## 2018-08-18 VITALS — BP 116/77 | HR 75 | Temp 97.7°F | Ht 64.5 in | Wt 142.8 lb

## 2018-08-18 DIAGNOSIS — Z0001 Encounter for general adult medical examination with abnormal findings: Secondary | ICD-10-CM | POA: Diagnosis not present

## 2018-08-18 DIAGNOSIS — M810 Age-related osteoporosis without current pathological fracture: Secondary | ICD-10-CM | POA: Diagnosis not present

## 2018-08-18 DIAGNOSIS — E039 Hypothyroidism, unspecified: Secondary | ICD-10-CM | POA: Diagnosis not present

## 2018-08-18 DIAGNOSIS — Z13220 Encounter for screening for lipoid disorders: Secondary | ICD-10-CM | POA: Diagnosis not present

## 2018-08-18 DIAGNOSIS — H579 Unspecified disorder of eye and adnexa: Secondary | ICD-10-CM

## 2018-08-18 DIAGNOSIS — Z0000 Encounter for general adult medical examination without abnormal findings: Secondary | ICD-10-CM

## 2018-08-18 DIAGNOSIS — Z13228 Encounter for screening for other metabolic disorders: Secondary | ICD-10-CM

## 2018-08-18 MED ORDER — THYROID 15 MG PO TABS: 15.0000 mg | ORAL_TABLET | Freq: Every day | ORAL | 1 refills | Status: DC
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
## 2018-08-18 NOTE — Patient Instructions (Signed)
Preventive Care 40-64 Years, Female Preventive care refers to lifestyle choices and visits with your health care provider that can promote health and wellness. What does preventive care include?   A yearly physical exam. This is also called an annual well check.  Dental exams once or twice a year.  Routine eye exams. Ask your health care provider how often you should have your eyes checked.  Personal lifestyle choices, including: ? Daily care of your teeth and gums. ? Regular physical activity. ? Eating a healthy diet. ? Avoiding tobacco and drug use. ? Limiting alcohol use. ? Practicing safe sex. ? Taking low-dose aspirin daily starting at age 50. ? Taking vitamin and mineral supplements as recommended by your health care provider. What happens during an annual well check? The services and screenings done by your health care provider during your annual well check will depend on your age, overall health, lifestyle risk factors, and family history of disease. Counseling Your health care provider may ask you questions about your:  Alcohol use.  Tobacco use.  Drug use.  Emotional well-being.  Home and relationship well-being.  Sexual activity.  Eating habits.  Work and work environment.  Method of birth control.  Menstrual cycle.  Pregnancy history. Screening You may have the following tests or measurements:  Height, weight, and BMI.  Blood pressure.  Lipid and cholesterol levels. These may be checked every 5 years, or more frequently if you are over 50 years old.  Skin check.  Lung cancer screening. You may have this screening every year starting at age 55 if you have a 30-pack-year history of smoking and currently smoke or have quit within the past 15 years.  Colorectal cancer screening. All adults should have this screening starting at age 50 and continuing until age 75. Your health care provider may recommend screening at age 45. You will have tests every  1-10 years, depending on your results and the type of screening test. People at increased risk should start screening at an earlier age. Screening tests may include: ? Guaiac-based fecal occult blood testing. ? Fecal immunochemical test (FIT). ? Stool DNA test. ? Virtual colonoscopy. ? Sigmoidoscopy. During this test, a flexible tube with a tiny camera (sigmoidoscope) is used to examine your rectum and lower colon. The sigmoidoscope is inserted through your anus into your rectum and lower colon. ? Colonoscopy. During this test, a long, thin, flexible tube with a tiny camera (colonoscope) is used to examine your entire colon and rectum.  Hepatitis C blood test.  Hepatitis B blood test.  Sexually transmitted disease (STD) testing.  Diabetes screening. This is done by checking your blood sugar (glucose) after you have not eaten for a while (fasting). You may have this done every 1-3 years.  Mammogram. This may be done every 1-2 years. Talk to your health care provider about when you should start having regular mammograms. This may depend on whether you have a family history of breast cancer.  BRCA-related cancer screening. This may be done if you have a family history of breast, ovarian, tubal, or peritoneal cancers.  Pelvic exam and Pap test. This may be done every 3 years starting at age 21. Starting at age 30, this may be done every 5 years if you have a Pap test in combination with an HPV test.  Bone density scan. This is done to screen for osteoporosis. You may have this scan if you are at high risk for osteoporosis. Discuss your test results, treatment options,   and if necessary, the need for more tests with your health care provider. Vaccines Your health care provider may recommend certain vaccines, such as:  Influenza vaccine. This is recommended every year.  Tetanus, diphtheria, and acellular pertussis (Tdap, Td) vaccine. You may need a Td booster every 10 years.  Varicella  vaccine. You may need this if you have not been vaccinated.  Zoster vaccine. You may need this after age 38.  Measles, mumps, and rubella (MMR) vaccine. You may need at least one dose of MMR if you were born in 1957 or later. You may also need a second dose.  Pneumococcal 13-valent conjugate (PCV13) vaccine. You may need this if you have certain conditions and were not previously vaccinated.  Pneumococcal polysaccharide (PPSV23) vaccine. You may need one or two doses if you smoke cigarettes or if you have certain conditions.  Meningococcal vaccine. You may need this if you have certain conditions.  Hepatitis A vaccine. You may need this if you have certain conditions or if you travel or work in places where you may be exposed to hepatitis A.  Hepatitis B vaccine. You may need this if you have certain conditions or if you travel or work in places where you may be exposed to hepatitis B.  Haemophilus influenzae type b (Hib) vaccine. You may need this if you have certain conditions. Talk to your health care provider about which screenings and vaccines you need and how often you need them. This information is not intended to replace advice given to you by your health care provider. Make sure you discuss any questions you have with your health care provider. Document Released: 07/12/2015 Document Revised: 08/05/2017 Document Reviewed: 04/16/2015 Elsevier Interactive Patient Education  2019 [employer].

## 2018-08-19 LAB — CMP14+EGFR
ALT: 6 IU/L (ref 0–32)
AST: 21 IU/L (ref 0–40)
Albumin/Globulin Ratio: 2.1 (ref 1.2–2.2)
Albumin: 4.6 g/dL (ref 3.8–4.8)
Alkaline Phosphatase: 81 IU/L (ref 39–117)
BUN/Creatinine Ratio: 17 (ref 12–28)
BUN: 15 mg/dL (ref 8–27)
Bilirubin Total: <0.2 mg/dL (ref 0.0–1.2)
CO2: 19 mmol/L — ABNORMAL LOW (ref 20–29)
Calcium: 9.3 mg/dL (ref 8.7–10.3)
Chloride: 103 mmol/L (ref 96–106)
Creatinine, Ser: 0.86 mg/dL (ref 0.57–1.00)
GFR calc Af Amer: 83 mL/min/{1.73_m2} (ref >59)
GFR calc non Af Amer: 72 mL/min/{1.73_m2} (ref >59)
Globulin, Total: 2.2 g/dL (ref 1.5–4.5)
Glucose: 102 mg/dL — ABNORMAL HIGH (ref 65–99)
Potassium: 4.6 mmol/L (ref 3.5–5.2)
Sodium: 137 mmol/L (ref 134–144)
Total Protein: 6.8 g/dL (ref 6.0–8.5)

## 2018-08-19 LAB — LIPID PANEL
Chol/HDL Ratio: 3 ratio (ref 0.0–4.4)
Cholesterol, Total: 204 mg/dL — ABNORMAL HIGH (ref 100–199)
HDL: 68 mg/dL (ref >39)
LDL Calculated: 123 mg/dL — ABNORMAL HIGH (ref 0–99)
Triglycerides: 64 mg/dL (ref 0–149)
VLDL Cholesterol Cal: 13 mg/dL (ref 5–40)

## 2018-08-19 LAB — VITAMIN D 25 HYDROXY (VIT D DEFICIENCY, FRACTURES): Vit D, 25-Hydroxy: 21.2 ng/mL — ABNORMAL LOW (ref 30.0–100.0)

## 2018-08-19 LAB — TSH: TSH: 5.68 u[IU]/mL — ABNORMAL HIGH (ref 0.450–4.500)
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
## 2018-08-24 ENCOUNTER — Telehealth: Payer: Self-pay | Admitting: Family Medicine

## 2018-08-24 ENCOUNTER — Inpatient Hospital Stay: Payer: Managed Care, Other (non HMO) | Attending: Hematology

## 2018-08-24 ENCOUNTER — Telehealth: Payer: Self-pay | Admitting: Hematology

## 2018-08-24 ENCOUNTER — Inpatient Hospital Stay (HOSPITAL_BASED_OUTPATIENT_CLINIC_OR_DEPARTMENT_OTHER): Payer: Managed Care, Other (non HMO) | Admitting: Hematology

## 2018-08-24 ENCOUNTER — Other Ambulatory Visit: Payer: Self-pay | Admitting: Family Medicine

## 2018-08-24 ENCOUNTER — Encounter: Payer: Self-pay | Admitting: Hematology

## 2018-08-24 VITALS — BP 117/73 | HR 84 | Temp 98.0°F | Resp 16 | Ht 64.5 in | Wt 140.5 lb

## 2018-08-24 DIAGNOSIS — C773 Secondary and unspecified malignant neoplasm of axilla and upper limb lymph nodes: Secondary | ICD-10-CM

## 2018-08-24 DIAGNOSIS — M810 Age-related osteoporosis without current pathological fracture: Secondary | ICD-10-CM | POA: Diagnosis not present

## 2018-08-24 DIAGNOSIS — E559 Vitamin D deficiency, unspecified: Secondary | ICD-10-CM | POA: Diagnosis not present

## 2018-08-24 DIAGNOSIS — E039 Hypothyroidism, unspecified: Secondary | ICD-10-CM

## 2018-08-24 DIAGNOSIS — Z170 Estrogen receptor positive status [ER+]: Secondary | ICD-10-CM

## 2018-08-24 DIAGNOSIS — C50412 Malignant neoplasm of upper-outer quadrant of left female breast: Secondary | ICD-10-CM | POA: Insufficient documentation

## 2018-08-24 DIAGNOSIS — Z0000 Encounter for general adult medical examination without abnormal findings: Secondary | ICD-10-CM

## 2018-08-24 LAB — CBC WITH DIFFERENTIAL/PLATELET
ABS IMMATURE GRANULOCYTES: 0.01 10*3/uL (ref 0.00–0.07)
BASOS PCT: 1 %
Basophils Absolute: 0 10*3/uL (ref 0.0–0.1)
Eosinophils Absolute: 0.1 10*3/uL (ref 0.0–0.5)
Eosinophils Relative: 1 %
HCT: 37.6 % (ref 36.0–46.0)
Hemoglobin: 12.6 g/dL (ref 12.0–15.0)
IMMATURE GRANULOCYTES: 0 %
Lymphocytes Relative: 30 %
Lymphs Abs: 1.4 10*3/uL (ref 0.7–4.0)
MCH: 30.4 pg (ref 26.0–34.0)
MCHC: 33.5 g/dL (ref 30.0–36.0)
MCV: 90.6 fL (ref 80.0–100.0)
MONOS PCT: 10 %
Monocytes Absolute: 0.4 10*3/uL (ref 0.1–1.0)
NEUTROS ABS: 2.6 10*3/uL (ref 1.7–7.7)
NEUTROS PCT: 58 %
PLATELETS: 234 10*3/uL (ref 150–400)
RBC: 4.15 MIL/uL (ref 3.87–5.11)
RDW: 12.1 % (ref 11.5–15.5)
WBC: 4.5 10*3/uL (ref 4.0–10.5)
nRBC: 0 % (ref 0.0–0.2)

## 2018-08-24 LAB — COMPREHENSIVE METABOLIC PANEL
ALK PHOS: 79 U/L (ref 38–126)
ALT: <6 U/L (ref 0–44)
ANION GAP: 7 (ref 5–15)
AST: 17 U/L (ref 15–41)
Albumin: 4.1 g/dL (ref 3.5–5.0)
BILIRUBIN TOTAL: 0.6 mg/dL (ref 0.3–1.2)
BUN: 9 mg/dL (ref 8–23)
CO2: 26 mmol/L (ref 22–32)
Calcium: 9 mg/dL (ref 8.9–10.3)
Chloride: 108 mmol/L (ref 98–111)
Creatinine, Ser: 0.78 mg/dL (ref 0.44–1.00)
Glucose, Bld: 102 mg/dL — ABNORMAL HIGH (ref 70–99)
POTASSIUM: 4.3 mmol/L (ref 3.5–5.1)
Sodium: 141 mmol/L (ref 135–145)
TOTAL PROTEIN: 7.2 g/dL (ref 6.5–8.1)

## 2018-08-24 MED ORDER — THYROID 30 MG PO TABS: 30.0000 mg | ORAL_TABLET | Freq: Every day | ORAL | 0 refills | Status: DC

## 2018-08-24 MED ORDER — VITAMIN D (ERGOCALCIFEROL) 1.25 MG (50000 UNIT) PO CAPS: 50000.0000 [IU] | ORAL_CAPSULE | ORAL | 0 refills | Status: DC

## 2018-08-24 NOTE — Telephone Encounter (Signed)
Copied from [place] [id number]. Topic: General - Other >> Aug 24, 2018  1:46 PM Percell Belt A wrote: Reason for CRM:   Pt called in and stated that she seen DR Burr Medico and Dr told her that her TSH was low.  She stated that Dr Burr Medico recommended that per thyroid meds may need to be upped. Pt wanted to get his message to Dr Pamella Pert to see if meds should be changed?    Best number  [phone]

## 2018-08-24 NOTE — Telephone Encounter (Signed)
Copied from [place] [id number]. Topic: General - Other >> Aug 24, 2018  1:46 PM Percell Belt A wrote: Reason for CRM:   Pt called in and stated that she seen DR Burr Medico and Dr told her that her TSH was low.  She stated that Dr Burr Medico recommended that per thyroid meds may need to be upped. Pt wanted to get his message to Dr Pamella Pert to see if meds should be changed?    Best number  [phone]

## 2018-08-24 NOTE — Telephone Encounter (Signed)
Wanting to if her thyroid medication should be increased

## 2018-08-24 NOTE — Telephone Encounter (Signed)
Scheduled appt per 2/26 los. ° °Printed calendar and avs. °

## 2018-08-24 NOTE — Telephone Encounter (Signed)
Please let patient know that I sent a new prescription for 30mg  of armour thyroid. Lets recheck TSH in 6-8 weeks. Lab ordered. thanks

## 2018-08-30 NOTE — Telephone Encounter (Signed)
Left VM to call back and put in CRM

## 2018-09-01 NOTE — Telephone Encounter (Signed)
Pt called back and will be able to be reached for one hour only before she goes to work.

## 2018-09-01 NOTE — Telephone Encounter (Signed)
Called pt left voice mail to call back for results

## 2018-09-02 ENCOUNTER — Ambulatory Visit: Payer: Self-pay | Admitting: Family Medicine

## 2018-09-02 NOTE — Telephone Encounter (Signed)
Spoke to provider and she stated it was okay for pt to take the thyroid medication. I have relayed the message to the pt and she stated understanding.

## 2018-09-02 NOTE — Telephone Encounter (Signed)
Please advise on pt questions about thyroid medication  She has picked up the Armour thyroid medication for this month which is 30 pills.   Is it ok for her to take 2 pills a day to equal the 30 mg?   She can't afford to pick up the new Rx until next month.

## 2018-09-02 NOTE — Telephone Encounter (Signed)
Pt called in for lab results from Dr. Pamella Pert.    I read her the message dated 08/28/2018 at 7:47 PM.    She has picked up the Armour thyroid medication for this month which is 30 pills.   Is it ok for her to take 2 pills a day to equal the 30 mg?   She can't afford to pick up the new Rx until next month.  I let her know someone from the office will be in contact with her regarding Dr. Ardyth Gal answer.  She is going to check with Dr. Burr Medico regarding the vitamin D Rx since she prescribed it.    These result notes sent to [facility] at [facility] for disposition from Dr. Pamella Pert.  Made into a telephone encounter since not in results notes que.

## 2018-10-19 ENCOUNTER — Other Ambulatory Visit: Payer: Self-pay | Admitting: Hematology

## 2018-11-11 ENCOUNTER — Other Ambulatory Visit: Payer: Self-pay | Admitting: Hematology

## 2018-12-05 ENCOUNTER — Other Ambulatory Visit: Payer: Self-pay | Admitting: Family Medicine

## 2018-12-05 DIAGNOSIS — Z0000 Encounter for general adult medical examination without abnormal findings: Secondary | ICD-10-CM

## 2018-12-12 ENCOUNTER — Other Ambulatory Visit: Payer: Self-pay | Admitting: Hematology

## 2019-01-04 ENCOUNTER — Other Ambulatory Visit: Payer: Self-pay | Admitting: Hematology

## 2019-01-27 ENCOUNTER — Other Ambulatory Visit: Payer: Self-pay | Admitting: Hematology
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
## 2019-02-21 DIAGNOSIS — M810 Age-related osteoporosis without current pathological fracture: Secondary | ICD-10-CM | POA: Insufficient documentation

## 2019-02-22 ENCOUNTER — Telehealth: Payer: Self-pay | Admitting: Hematology

## 2019-02-22 ENCOUNTER — Inpatient Hospital Stay: Payer: Managed Care, Other (non HMO)

## 2019-02-22 ENCOUNTER — Other Ambulatory Visit: Payer: Self-pay

## 2019-02-22 ENCOUNTER — Inpatient Hospital Stay: Payer: Managed Care, Other (non HMO) | Attending: Hematology

## 2019-02-22 ENCOUNTER — Encounter: Payer: Self-pay | Admitting: Hematology

## 2019-02-22 ENCOUNTER — Inpatient Hospital Stay (HOSPITAL_BASED_OUTPATIENT_CLINIC_OR_DEPARTMENT_OTHER): Payer: Managed Care, Other (non HMO) | Admitting: Hematology

## 2019-02-22 VITALS — BP 130/72 | HR 77 | Temp 98.2°F | Resp 18 | Ht 64.5 in | Wt 135.7 lb

## 2019-02-22 DIAGNOSIS — E559 Vitamin D deficiency, unspecified: Secondary | ICD-10-CM | POA: Diagnosis not present

## 2019-02-22 DIAGNOSIS — C50412 Malignant neoplasm of upper-outer quadrant of left female breast: Secondary | ICD-10-CM

## 2019-02-22 DIAGNOSIS — M816 Localized osteoporosis [Lequesne]: Secondary | ICD-10-CM

## 2019-02-22 DIAGNOSIS — M810 Age-related osteoporosis without current pathological fracture: Secondary | ICD-10-CM | POA: Insufficient documentation

## 2019-02-22 DIAGNOSIS — Z95828 Presence of other vascular implants and grafts: Secondary | ICD-10-CM

## 2019-02-22 DIAGNOSIS — Z170 Estrogen receptor positive status [ER+]: Secondary | ICD-10-CM | POA: Diagnosis not present

## 2019-02-22 LAB — COMPREHENSIVE METABOLIC PANEL
ALT: 8 U/L (ref 0–44)
AST: 19 U/L (ref 15–41)
Albumin: 3.8 g/dL (ref 3.5–5.0)
Alkaline Phosphatase: 77 U/L (ref 38–126)
Anion gap: 9 (ref 5–15)
BUN: 11 mg/dL (ref 8–23)
CO2: 23 mmol/L (ref 22–32)
Calcium: 8.9 mg/dL (ref 8.9–10.3)
Chloride: 110 mmol/L (ref 98–111)
Creatinine, Ser: 0.73 mg/dL (ref 0.44–1.00)
GFR calc Af Amer: >60 mL/min (ref >60)
GFR calc non Af Amer: >60 mL/min (ref >60)
Glucose, Bld: 99 mg/dL (ref 70–99)
Potassium: 4.2 mmol/L (ref 3.5–5.1)
Sodium: 142 mmol/L (ref 135–145)
Total Bilirubin: 0.5 mg/dL (ref 0.3–1.2)
Total Protein: 6.8 g/dL (ref 6.5–8.1)

## 2019-02-22 LAB — CBC WITH DIFFERENTIAL/PLATELET
Abs Immature Granulocytes: 0.01 10*3/uL (ref 0.00–0.07)
Basophils Absolute: 0 10*3/uL (ref 0.0–0.1)
Basophils Relative: 1 %
Eosinophils Absolute: 0.1 10*3/uL (ref 0.0–0.5)
Eosinophils Relative: 3 %
HCT: 36.9 % (ref 36.0–46.0)
Hemoglobin: 12.9 g/dL (ref 12.0–15.0)
Immature Granulocytes: 0 %
Lymphocytes Relative: 32 %
Lymphs Abs: 1.6 10*3/uL (ref 0.7–4.0)
MCH: 31.2 pg (ref 26.0–34.0)
MCHC: 35 g/dL (ref 30.0–36.0)
MCV: 89.3 fL (ref 80.0–100.0)
Monocytes Absolute: 0.6 10*3/uL (ref 0.1–1.0)
Monocytes Relative: 11 %
Neutro Abs: 2.6 10*3/uL (ref 1.7–7.7)
Neutrophils Relative %: 53 %
Platelets: 234 10*3/uL (ref 150–400)
RBC: 4.13 MIL/uL (ref 3.87–5.11)
RDW: 11.9 % (ref 11.5–15.5)
WBC: 5 10*3/uL (ref 4.0–10.5)
nRBC: 0 % (ref 0.0–0.2)

## 2019-02-22 MED ORDER — SODIUM CHLORIDE 0.9 % IV SOLN
Freq: Once | INTRAVENOUS | Status: AC
  Administered 2019-02-22: 10:00:00 via INTRAVENOUS
  Filled 2019-02-22: qty 250

## 2019-02-22 MED ORDER — ZOLEDRONIC ACID 4 MG/100ML IV SOLN
4.0000 mg | Freq: Once | INTRAVENOUS | Status: AC
  Administered 2019-02-22: 4 mg via INTRAVENOUS
  Filled 2019-02-22: qty 100

## 2019-02-22 NOTE — Telephone Encounter (Signed)
Scheduled appt per 8/26 los.  Left a voice message of appt date and time

## 2019-02-22 NOTE — Patient Instructions (Signed)
Zoledronic Acid injection (Hypercalcemia, Oncology) What is this medicine? ZOLEDRONIC ACID (ZOE le dron ik AS id) lowers the amount of calcium loss from bone. It is used to treat too much calcium in your blood from cancer. It is also used to prevent complications of cancer that has spread to the bone. This medicine may be used for other purposes; ask your health care provider or pharmacist if you have questions. COMMON BRAND NAME(S): Zometa What should I tell my health care provider before I take this medicine? They need to know if you have any of these conditions:  aspirin-sensitive asthma  cancer, especially if you are receiving medicines used to treat cancer  dental disease or wear dentures  infection  kidney disease  receiving corticosteroids like dexamethasone or prednisone  an unusual or allergic reaction to zoledronic acid, other medicines, foods, dyes, or preservatives  pregnant or trying to get pregnant  breast-feeding How should I use this medicine? This medicine is for infusion into a vein. It is given by a health care professional in a hospital or clinic setting. Talk to your pediatrician regarding the use of this medicine in children. Special care may be needed. Overdosage: If you think you have taken too much of this medicine contact a poison control center or emergency room at once. NOTE: This medicine is only for you. Do not share this medicine with others. What if I miss a dose? It is important not to miss your dose. Call your doctor or health care professional if you are unable to keep an appointment. What may interact with this medicine?  certain antibiotics given by injection  NSAIDs, medicines for pain and inflammation, like ibuprofen or naproxen  some diuretics like bumetanide, furosemide  teriparatide  thalidomide This list may not describe all possible interactions. Give your health care provider a list of all the medicines, herbs, non-prescription  drugs, or dietary supplements you use. Also tell them if you smoke, drink alcohol, or use illegal drugs. Some items may interact with your medicine. What should I watch for while using this medicine? Visit your doctor or health care professional for regular checkups. It may be some time before you see the benefit from this medicine. Do not stop taking your medicine unless your doctor tells you to. Your doctor may order blood tests or other tests to see how you are doing. Women should inform their doctor if they wish to become pregnant or think they might be pregnant. There is a potential for serious side effects to an unborn child. Talk to your health care professional or pharmacist for more information. You should make sure that you get enough calcium and vitamin D while you are taking this medicine. Discuss the foods you eat and the vitamins you take with your health care professional. Some people who take this medicine have severe bone, joint, and/or muscle pain. This medicine may also increase your risk for jaw problems or a broken thigh bone. Tell your doctor right away if you have severe pain in your jaw, bones, joints, or muscles. Tell your doctor if you have any pain that does not go away or that gets worse. Tell your dentist and dental surgeon that you are taking this medicine. You should not have major dental surgery while on this medicine. See your dentist to have a dental exam and fix any dental problems before starting this medicine. Take good care of your teeth while on this medicine. Make sure you see your dentist for regular follow-up   appointments. What side effects may I notice from receiving this medicine? Side effects that you should report to your doctor or health care professional as soon as possible:  allergic reactions like skin rash, itching or hives, swelling of the face, lips, or tongue  anxiety, confusion, or depression  breathing problems  changes in vision  eye  pain  feeling faint or lightheaded, falls  jaw pain, especially after dental work  mouth sores  muscle cramps, stiffness, or weakness  redness, blistering, peeling or loosening of the skin, including inside the mouth  trouble passing urine or change in the amount of urine Side effects that usually do not require medical attention (report to your doctor or health care professional if they continue or are bothersome):  bone, joint, or muscle pain  constipation  diarrhea  fever  hair loss  irritation at site where injected  loss of appetite  nausea, vomiting  stomach upset  trouble sleeping  trouble swallowing  weak or tired This list may not describe all possible side effects. Call your doctor for medical advice about side effects. You may report side effects to FDA at 1-800-FDA-1088. Where should I keep my medicine? This drug is given in a hospital or clinic and will not be stored at home. NOTE: This sheet is a summary. It may not cover all possible information. If you have questions about this medicine, talk to your doctor, pharmacist, or health care provider.  2020 Elsevier/Gold Standard (2013-11-11 14:19:39)  

## 2019-02-23 ENCOUNTER — Other Ambulatory Visit: Payer: Self-pay | Admitting: Hematology

## 2019-04-10 ENCOUNTER — Other Ambulatory Visit: Payer: Self-pay | Admitting: Hematology

## 2019-05-05 ENCOUNTER — Other Ambulatory Visit: Payer: Self-pay | Admitting: Hematology

## 2019-05-17 ENCOUNTER — Ambulatory Visit
Admission: RE | Admit: 2019-05-17 | Discharge: 2019-05-17 | Disposition: A | Discharge status: Home / Self Care | Payer: Managed Care, Other (non HMO) | Source: Ambulatory Visit | Attending: Hematology | Admitting: Hematology

## 2019-05-17 ENCOUNTER — Other Ambulatory Visit: Payer: Self-pay

## 2019-05-17 DIAGNOSIS — C50412 Malignant neoplasm of upper-outer quadrant of left female breast: Secondary | ICD-10-CM

## 2019-05-17 HISTORY — DX: Personal history of irradiation: Z92.3

## 2019-06-02 ENCOUNTER — Other Ambulatory Visit: Payer: Self-pay | Admitting: Hematology

## 2019-06-13 ENCOUNTER — Other Ambulatory Visit: Payer: Self-pay | Admitting: Hematology

## 2019-07-06 ENCOUNTER — Other Ambulatory Visit: Payer: Self-pay | Admitting: Hematology

## 2019-07-28 ENCOUNTER — Encounter: Payer: Medicare Other | Admitting: Family Medicine

## 2019-07-28 ENCOUNTER — Other Ambulatory Visit: Payer: Self-pay

## 2019-08-03 ENCOUNTER — Other Ambulatory Visit: Payer: Self-pay | Admitting: Hematology
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
## 2019-08-23 ENCOUNTER — Other Ambulatory Visit: Payer: Self-pay

## 2019-08-23 ENCOUNTER — Inpatient Hospital Stay: Payer: Managed Care, Other (non HMO) | Attending: Hematology

## 2019-08-23 ENCOUNTER — Inpatient Hospital Stay (HOSPITAL_BASED_OUTPATIENT_CLINIC_OR_DEPARTMENT_OTHER): Payer: Managed Care, Other (non HMO) | Admitting: Hematology

## 2019-08-23 ENCOUNTER — Inpatient Hospital Stay: Payer: Managed Care, Other (non HMO)

## 2019-08-23 ENCOUNTER — Encounter: Payer: Self-pay | Admitting: Hematology

## 2019-08-23 VITALS — BP 117/73 | HR 85 | Temp 97.8°F | Resp 18 | Ht 64.5 in | Wt 136.5 lb

## 2019-08-23 DIAGNOSIS — C50412 Malignant neoplasm of upper-outer quadrant of left female breast: Secondary | ICD-10-CM | POA: Insufficient documentation

## 2019-08-23 DIAGNOSIS — D649 Anemia, unspecified: Secondary | ICD-10-CM | POA: Diagnosis not present

## 2019-08-23 DIAGNOSIS — M816 Localized osteoporosis [Lequesne]: Secondary | ICD-10-CM | POA: Diagnosis not present

## 2019-08-23 DIAGNOSIS — M810 Age-related osteoporosis without current pathological fracture: Secondary | ICD-10-CM | POA: Insufficient documentation

## 2019-08-23 DIAGNOSIS — Z170 Estrogen receptor positive status [ER+]: Secondary | ICD-10-CM | POA: Diagnosis not present

## 2019-08-23 DIAGNOSIS — C773 Secondary and unspecified malignant neoplasm of axilla and upper limb lymph nodes: Secondary | ICD-10-CM | POA: Diagnosis not present

## 2019-08-23 DIAGNOSIS — Z95828 Presence of other vascular implants and grafts: Secondary | ICD-10-CM

## 2019-08-23 LAB — COMPREHENSIVE METABOLIC PANEL
ALT: 8 U/L (ref 0–44)
AST: 15 U/L (ref 15–41)
Albumin: 3.7 g/dL (ref 3.5–5.0)
Alkaline Phosphatase: 71 U/L (ref 38–126)
Anion gap: 9 (ref 5–15)
BUN: 11 mg/dL (ref 8–23)
CO2: 23 mmol/L (ref 22–32)
Calcium: 8.5 mg/dL — ABNORMAL LOW (ref 8.9–10.3)
Chloride: 109 mmol/L (ref 98–111)
Creatinine, Ser: 0.72 mg/dL (ref 0.44–1.00)
GFR calc Af Amer: >60 mL/min (ref >60)
GFR calc non Af Amer: >60 mL/min (ref >60)
Glucose, Bld: 105 mg/dL — ABNORMAL HIGH (ref 70–99)
Potassium: 4.1 mmol/L (ref 3.5–5.1)
Sodium: 141 mmol/L (ref 135–145)
Total Bilirubin: 0.4 mg/dL (ref 0.3–1.2)
Total Protein: 6.7 g/dL (ref 6.5–8.1)

## 2019-08-23 LAB — VITAMIN B12: Vitamin B-12: 176 pg/mL — ABNORMAL LOW (ref 180–914)

## 2019-08-23 LAB — CBC WITH DIFFERENTIAL/PLATELET
Abs Immature Granulocytes: 0.01 10*3/uL (ref 0.00–0.07)
Basophils Absolute: 0 10*3/uL (ref 0.0–0.1)
Basophils Relative: 1 %
Eosinophils Absolute: 0.1 10*3/uL (ref 0.0–0.5)
Eosinophils Relative: 1 %
HCT: 34.5 % — ABNORMAL LOW (ref 36.0–46.0)
Hemoglobin: 11.9 g/dL — ABNORMAL LOW (ref 12.0–15.0)
Immature Granulocytes: 0 %
Lymphocytes Relative: 31 %
Lymphs Abs: 1.5 10*3/uL (ref 0.7–4.0)
MCH: 31.1 pg (ref 26.0–34.0)
MCHC: 34.5 g/dL (ref 30.0–36.0)
MCV: 90.1 fL (ref 80.0–100.0)
Monocytes Absolute: 0.4 10*3/uL (ref 0.1–1.0)
Monocytes Relative: 9 %
Neutro Abs: 2.8 10*3/uL (ref 1.7–7.7)
Neutrophils Relative %: 58 %
Platelets: 249 10*3/uL (ref 150–400)
RBC: 3.83 MIL/uL — ABNORMAL LOW (ref 3.87–5.11)
RDW: 12 % (ref 11.5–15.5)
WBC: 4.8 10*3/uL (ref 4.0–10.5)
nRBC: 0 % (ref 0.0–0.2)

## 2019-08-23 LAB — FERRITIN: Ferritin: 56 ng/mL (ref 11–307)

## 2019-08-23 LAB — VITAMIN D 25 HYDROXY (VIT D DEFICIENCY, FRACTURES): Vit D, 25-Hydroxy: 57.05 ng/mL (ref 30–100)

## 2019-08-23 MED ORDER — ZOLEDRONIC ACID 4 MG/100ML IV SOLN
INTRAVENOUS | Status: AC
  Filled 2019-08-23: qty 100

## 2019-08-23 MED ORDER — SODIUM CHLORIDE 0.9 % IV SOLN
Freq: Once | INTRAVENOUS | Status: AC
  Filled 2019-08-23: qty 250

## 2019-08-23 MED ORDER — ZOLEDRONIC ACID 4 MG/100ML IV SOLN
4.0000 mg | Freq: Once | INTRAVENOUS | Status: AC
  Administered 2019-08-23: 4 mg via INTRAVENOUS

## 2019-08-23 NOTE — Patient Instructions (Signed)
Zoledronic Acid injection (Hypercalcemia, Oncology) What is this medicine? ZOLEDRONIC ACID (ZOE le dron ik AS id) lowers the amount of calcium loss from bone. It is used to treat too much calcium in your blood from cancer. It is also used to prevent complications of cancer that has spread to the bone. This medicine may be used for other purposes; ask your health care provider or pharmacist if you have questions. COMMON BRAND NAME(S): Zometa What should I tell my health care provider before I take this medicine? They need to know if you have any of these conditions:  aspirin-sensitive asthma  cancer, especially if you are receiving medicines used to treat cancer  dental disease or wear dentures  infection  kidney disease  receiving corticosteroids like dexamethasone or prednisone  an unusual or allergic reaction to zoledronic acid, other medicines, foods, dyes, or preservatives  pregnant or trying to get pregnant  breast-feeding How should I use this medicine? This medicine is for infusion into a vein. It is given by a health care professional in a hospital or clinic setting. Talk to your pediatrician regarding the use of this medicine in children. Special care may be needed. Overdosage: If you think you have taken too much of this medicine contact a poison control center or emergency room at once. NOTE: This medicine is only for you. Do not share this medicine with others. What if I miss a dose? It is important not to miss your dose. Call your doctor or health care professional if you are unable to keep an appointment. What may interact with this medicine?  certain antibiotics given by injection  NSAIDs, medicines for pain and inflammation, like ibuprofen or naproxen  some diuretics like bumetanide, furosemide  teriparatide  thalidomide This list may not describe all possible interactions. Give your health care provider a list of all the medicines, herbs, non-prescription  drugs, or dietary supplements you use. Also tell them if you smoke, drink alcohol, or use illegal drugs. Some items may interact with your medicine. What should I watch for while using this medicine? Visit your doctor or health care professional for regular checkups. It may be some time before you see the benefit from this medicine. Do not stop taking your medicine unless your doctor tells you to. Your doctor may order blood tests or other tests to see how you are doing. Women should inform their doctor if they wish to become pregnant or think they might be pregnant. There is a potential for serious side effects to an unborn child. Talk to your health care professional or pharmacist for more information. You should make sure that you get enough calcium and vitamin D while you are taking this medicine. Discuss the foods you eat and the vitamins you take with your health care professional. Some people who take this medicine have severe bone, joint, and/or muscle pain. This medicine may also increase your risk for jaw problems or a broken thigh bone. Tell your doctor right away if you have severe pain in your jaw, bones, joints, or muscles. Tell your doctor if you have any pain that does not go away or that gets worse. Tell your dentist and dental surgeon that you are taking this medicine. You should not have major dental surgery while on this medicine. See your dentist to have a dental exam and fix any dental problems before starting this medicine. Take good care of your teeth while on this medicine. Make sure you see your dentist for regular follow-up   appointments. What side effects may I notice from receiving this medicine? Side effects that you should report to your doctor or health care professional as soon as possible:  allergic reactions like skin rash, itching or hives, swelling of the face, lips, or tongue  anxiety, confusion, or depression  breathing problems  changes in vision  eye  pain  feeling faint or lightheaded, falls  jaw pain, especially after dental work  mouth sores  muscle cramps, stiffness, or weakness  redness, blistering, peeling or loosening of the skin, including inside the mouth  trouble passing urine or change in the amount of urine Side effects that usually do not require medical attention (report to your doctor or health care professional if they continue or are bothersome):  bone, joint, or muscle pain  constipation  diarrhea  fever  hair loss  irritation at site where injected  loss of appetite  nausea, vomiting  stomach upset  trouble sleeping  trouble swallowing  weak or tired This list may not describe all possible side effects. Call your doctor for medical advice about side effects. You may report side effects to FDA at 1-800-FDA-1088. Where should I keep my medicine? This drug is given in a hospital or clinic and will not be stored at home. NOTE: This sheet is a summary. It may not cover all possible information. If you have questions about this medicine, talk to your doctor, pharmacist, or health care provider.  2020 Elsevier/Gold Standard (2013-11-11 14:19:39)  

## 2019-08-23 NOTE — Progress Notes (Signed)
Supportive Care Medication Change  Patient has difficulty with IV access on dates of zoledronic acid infusions and expressed interest today in getting an injection instead. Spoke w/ Dr. Burr Medico, update patient plan to change treatment to Prolia so long as insurance allows. Patient educated on risk of hypocalcemia with denosumab and will increase calcium supplements as discussed with provider.   The plan for bone modifying agent therapy is as follows: Prolia 60mg  [place] every 6 months (every 24 weeks).   Please follow up authorization status prior next dose in August 2021.  Angela [name], PharmD, [facility] Phone: [phone] 08/23/2019

## 2019-08-23 NOTE — Addendum Note (Signed)
Addended by: Reyne Dumas E on: 08/23/2019 12:10 PM   Modules accepted: Orders

## 2019-08-24 ENCOUNTER — Telehealth: Payer: Self-pay | Admitting: Hematology

## 2019-08-24 ENCOUNTER — Other Ambulatory Visit: Payer: Self-pay | Admitting: Hematology

## 2019-08-24 DIAGNOSIS — E538 Deficiency of other specified B group vitamins: Secondary | ICD-10-CM

## 2019-08-24 NOTE — Telephone Encounter (Signed)
Scheduled appt per 2/24 los.  Sent a message to HIM pool to get a calendar mailed out.

## 2019-08-28 ENCOUNTER — Telehealth: Payer: Self-pay

## 2019-08-28 ENCOUNTER — Other Ambulatory Visit: Payer: Self-pay

## 2019-08-28 ENCOUNTER — Ambulatory Visit (INDEPENDENT_AMBULATORY_CARE_PROVIDER_SITE_OTHER): Payer: Managed Care, Other (non HMO) | Admitting: Family Medicine

## 2019-08-28 ENCOUNTER — Encounter: Payer: Self-pay | Admitting: Family Medicine

## 2019-08-28 VITALS — BP 113/75 | HR 91 | Temp 97.7°F | Resp 18 | Ht 64.75 in | Wt 135.0 lb

## 2019-08-28 DIAGNOSIS — Z13220 Encounter for screening for lipoid disorders: Secondary | ICD-10-CM | POA: Diagnosis not present

## 2019-08-28 DIAGNOSIS — E039 Hypothyroidism, unspecified: Secondary | ICD-10-CM

## 2019-08-28 DIAGNOSIS — Z0001 Encounter for general adult medical examination with abnormal findings: Secondary | ICD-10-CM

## 2019-08-28 DIAGNOSIS — H579 Unspecified disorder of eye and adnexa: Secondary | ICD-10-CM

## 2019-08-28 DIAGNOSIS — R300 Dysuria: Secondary | ICD-10-CM | POA: Diagnosis not present

## 2019-08-28 DIAGNOSIS — Z0000 Encounter for general adult medical examination without abnormal findings: Secondary | ICD-10-CM

## 2019-08-28 LAB — POCT URINALYSIS DIP (MANUAL ENTRY)
Bilirubin, UA: NEGATIVE
Blood, UA: NEGATIVE
Glucose, UA: NEGATIVE mg/dL
Ketones, POC UA: NEGATIVE mg/dL
Nitrite, UA: NEGATIVE
Protein Ur, POC: NEGATIVE mg/dL
Spec Grav, UA: 1.02 (ref 1.010–1.025)
Urobilinogen, UA: 0.2 E.U./dL
pH, UA: 7.5 (ref 5.0–8.0)

## 2019-08-28 MED ORDER — THYROID 30 MG PO TABS: 30.0000 mg | ORAL_TABLET | Freq: Every day | ORAL | 3 refills | Status: AC

## 2019-08-28 NOTE — Patient Instructions (Signed)
Preventive Care 66 Years and Older, Female Preventive care refers to lifestyle choices and visits with your health care provider that can promote health and wellness. This includes:  A yearly physical exam. This is also called an annual well check.  Regular dental and eye exams.  Immunizations.  Screening for certain conditions.  Healthy lifestyle choices, such as diet and exercise. What can I expect for my preventive care visit? Physical exam Your health care provider will check:  Height and weight. These may be used to calculate body mass index (BMI), which is a measurement that tells if you are at a healthy weight.  Heart rate and blood pressure.  Your skin for abnormal spots. Counseling Your health care provider may ask you questions about:  Alcohol, tobacco, and drug use.  Emotional well-being.  Home and relationship well-being.  Sexual activity.  Eating habits.  History of falls.  Memory and ability to understand (cognition).  Work and work Statistician.  Pregnancy and menstrual history. What immunizations do I need?  Influenza (flu) vaccine  This is recommended every year. Tetanus, diphtheria, and pertussis (Tdap) vaccine  You may need a Td booster every 10 years. Varicella (chickenpox) vaccine  You may need this vaccine if you have not already been vaccinated. Zoster (shingles) vaccine  You may need this after age 33. Pneumococcal conjugate (PCV13) vaccine  One dose is recommended after age 33. Pneumococcal polysaccharide (PPSV23) vaccine  One dose is recommended after age 72. Measles, mumps, and rubella (MMR) vaccine  You may need at least one dose of MMR if you were born in 1957 or later. You may also need a second dose. Meningococcal conjugate (MenACWY) vaccine  You may need this if you have certain conditions. Hepatitis A vaccine  You may need this if you have certain conditions or if you travel or work in places where you may be exposed  to hepatitis A. Hepatitis B vaccine  You may need this if you have certain conditions or if you travel or work in places where you may be exposed to hepatitis B. Haemophilus influenzae type b (Hib) vaccine  You may need this if you have certain conditions. You may receive vaccines as individual doses or as more than one vaccine together in one shot (combination vaccines). Talk with your health care provider about the risks and benefits of combination vaccines. What tests do I need? Blood tests  Lipid and cholesterol levels. These may be checked every 5 years, or more frequently depending on your overall health.  Hepatitis C test.  Hepatitis B test. Screening  Lung cancer screening. You may have this screening every year starting at age 39 if you have a 30-pack-year history of smoking and currently smoke or have quit within the past 15 years.  Colorectal cancer screening. All adults should have this screening starting at age 36 and continuing until age 15. Your health care provider may recommend screening at age 23 if you are at increased risk. You will have tests every 1-10 years, depending on your results and the type of screening test.  Diabetes screening. This is done by checking your blood sugar (glucose) after you have not eaten for a while (fasting). You may have this done every 1-3 years.  Mammogram. This may be done every 1-2 years. Talk with your health care provider about how often you should have regular mammograms.  BRCA-related cancer screening. This may be done if you have a family history of breast, ovarian, tubal, or peritoneal cancers.  Other tests  Sexually transmitted disease (STD) testing.  Bone density scan. This is done to screen for osteoporosis. You may have this done starting at age 44. Follow these instructions at home: Eating and drinking  Eat a diet that includes fresh fruits and vegetables, whole grains, lean protein, and low-fat dairy products. Limit  your intake of foods with high amounts of sugar, saturated fats, and salt.  Take vitamin and mineral supplements as recommended by your health care provider.  Do not drink alcohol if your health care provider tells you not to drink.  If you drink alcohol: ? Limit how much you have to 0-1 drink a day. ? Be aware of how much alcohol is in your drink. In the [place] one drink equals one 12 oz bottle of beer (355 mL), one 5 oz glass of wine (148 mL), or one 1 oz glass of hard liquor (44 mL). Lifestyle  Take daily care of your teeth and gums.  Stay active. Exercise for at least 30 minutes on 5 or more days each week.  Do not use any products that contain nicotine or tobacco, such as cigarettes, e-cigarettes, and chewing tobacco. If you need help quitting, ask your health care provider.  If you are sexually active, practice safe sex. Use a condom or other form of protection in order to prevent STIs (sexually transmitted infections).  Talk with your health care provider about taking a low-dose aspirin or statin. What's next?  Go to your health care provider once a year for a well check visit.  Ask your health care provider how often you should have your eyes and teeth checked.  Stay up to date on all vaccines. This information is not intended to replace advice given to you by your health care provider. Make sure you discuss any questions you have with your health care provider. Document Revised: 06/09/2018 Document Reviewed: 06/09/2018 Elsevier Patient Education  2020 [employer].

## 2019-08-28 NOTE — Telephone Encounter (Signed)
VM message left  to Pt per Dr. Burr Medico to inform her of her low B12  Level, which is why she has mild anemia. Informed Pt on message Dr. Ernestina Penna recommendation is for her to take B12 1000 mcg daily and repeat labs in 2 months. Informed Pt. If after taking the B12 and labs are still low she will suggest B12 injections. Informed Pt if she should have any problems or concerns return call to the office
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
## 2019-08-28 NOTE — Telephone Encounter (Signed)
-----   Message from Rennis Harding, RN sent at 08/24/2019  8:35 AM EST -----  ----- Message ----- From: Truitt Merle, MD Sent: 08/24/2019   8:29 AM EST To: [facility]  Please let pt know her B12 level is very low, that's why she has mild anemia now. I recommend oral B12 1061mcg daily (OTC0 and repeat lab in 2 months. If she has persistent low B12 despite oral supplement then she would need B12 injections.   Thanks  Truitt Merle  08/24/2019

## 2019-08-29 LAB — LIPID PANEL
Chol/HDL Ratio: 3.2 ratio (ref 0.0–4.4)
Cholesterol, Total: 209 mg/dL — ABNORMAL HIGH (ref 100–199)
HDL: 65 mg/dL (ref >39)
LDL Chol Calc (NIH): 127 mg/dL — ABNORMAL HIGH (ref 0–99)
Triglycerides: 94 mg/dL (ref 0–149)
VLDL Cholesterol Cal: 17 mg/dL (ref 5–40)

## 2019-08-29 LAB — TSH: TSH: 4.06 u[IU]/mL (ref 0.450–4.500)

## 2019-09-06 ENCOUNTER — Other Ambulatory Visit: Payer: Self-pay | Admitting: Hematology

## 2019-09-07 NOTE — Addendum Note (Signed)
Addended by: Rutherford Guys on: 09/07/2019 07:51 PM   Modules accepted: Level of Service

## 2019-09-11 ENCOUNTER — Encounter: Payer: Self-pay | Admitting: Radiology

## 2019-10-02 ENCOUNTER — Other Ambulatory Visit: Payer: Self-pay | Admitting: Hematology

## 2019-10-04 ENCOUNTER — Other Ambulatory Visit: Payer: Self-pay | Admitting: Hematology

## 2019-11-03 ENCOUNTER — Other Ambulatory Visit: Payer: Self-pay | Admitting: Hematology

## 2019-11-30 ENCOUNTER — Other Ambulatory Visit: Payer: Self-pay | Admitting: Hematology

## 2019-12-27 ENCOUNTER — Other Ambulatory Visit: Payer: Self-pay | Admitting: Hematology

## 2020-01-24 ENCOUNTER — Other Ambulatory Visit: Payer: Self-pay

## 2020-01-24 ENCOUNTER — Encounter: Payer: Self-pay | Admitting: Obstetrics and Gynecology

## 2020-01-24 ENCOUNTER — Ambulatory Visit (INDEPENDENT_AMBULATORY_CARE_PROVIDER_SITE_OTHER): Payer: Managed Care, Other (non HMO) | Admitting: Obstetrics and Gynecology

## 2020-01-24 VITALS — BP 122/76 | Ht 64.0 in | Wt 135.0 lb

## 2020-01-24 DIAGNOSIS — N898 Other specified noninflammatory disorders of vagina: Secondary | ICD-10-CM | POA: Diagnosis not present

## 2020-01-24 DIAGNOSIS — N941 Unspecified dyspareunia: Secondary | ICD-10-CM

## 2020-01-24 DIAGNOSIS — R300 Dysuria: Secondary | ICD-10-CM

## 2020-01-24 DIAGNOSIS — N952 Postmenopausal atrophic vaginitis: Secondary | ICD-10-CM

## 2020-01-24 LAB — URINALYSIS, COMPLETE W/RFL CULTURE
Bacteria, UA: NONE SEEN /HPF
Bilirubin Urine: NEGATIVE
Glucose, UA: NEGATIVE
Hgb urine dipstick: NEGATIVE
Hyaline Cast: NONE SEEN /LPF
Ketones, ur: NEGATIVE
Leukocyte Esterase: NEGATIVE
Nitrites, Initial: NEGATIVE
Protein, ur: NEGATIVE
RBC / HPF: NONE SEEN /HPF (ref 0–2)
Specific Gravity, Urine: 1.01 (ref 1.001–1.03)
WBC, UA: NONE SEEN /HPF (ref 0–5)
pH: 6.5 (ref 5.0–8.0)

## 2020-01-24 LAB — WET PREP FOR TRICH, YEAST, CLUE

## 2020-01-24 LAB — NO CULTURE INDICATED

## 2020-01-24 NOTE — Progress Notes (Signed)
Angela [name] 02-Mar-1956 [id number]  SUBJECTIVE:  64 y.o. G1P0010 female presents for evaluation of dyspareunia.  Reports since about 2015 after years of being abstinent, she met a new partner but found intercourse to be painful.  She was diagnosed with breast cancer in 2018 (which was found to be ER positive).  She does take letrozole as part of her treatment. She reports she has used lubricants still has the discomfort.  Same partner.  No discharge or itching or burning.  Warm sensation over vulvar and vaginal area in general.  Feels discomfort during intercourse deep inside the vagina.  No external pain or vulvar discomfort.  Current Outpatient Medications  Medication Sig Dispense Refill  . CALCIUM/MAGNESIUM/ZINC FORMULA PO Take by mouth.    . Cyanocobalamin (VITAMIN B 12 PO) Take by mouth.    . letrozole (FEMARA) 2.5 MG tablet TAKE 1 TABLET BY MOUTH EVERY DAY 90 tablet 3  . Omega-3 Fatty Acids (FISH OIL) 1000 MG CAPS Take 1 capsule by mouth daily.    Marland Kitchen thyroid (ARMOUR THYROID) 30 MG tablet Take 1 tablet (30 mg total) by mouth daily. 90 tablet 3  . Vitamin D, Ergocalciferol, (DRISDOL) 1.25 MG (50000 UNIT) CAPS capsule TAKE 1 CAPSULE BY MOUTH EVERY 7 DAYS. 4 capsule 4   No current facility-administered medications for this visit.   Allergies: Morphine and related  No LMP recorded. Patient is postmenopausal.  Past medical history,surgical history, problem list, medications, allergies, family history and social history were all reviewed and documented as reviewed in the EPIC chart.  ROS:  Feeling well. No dyspnea or chest pain on exertion. GYN ROS: As described in HPI   OBJECTIVE:  BP 122/76   Ht 5' 4"  (1.626 m)   Wt 135 lb (61.2 kg)   BMI 23.17 kg/m  The patient appears well, alert, oriented x 3, in no distress. PELVIC EXAM: VULVA: normal appearing vulva with no masses, tenderness or lesions, no pain with light touch sensation using cotton swab over the vaginal introitus and labia  minora.  Atrophic changes, VAGINA: normal atrophic appearing vagina with normal color and discharge, no lesions, bladder not particularly tender, CERVIX: normal appearing cervix without discharge or lesions, UTERUS: uterus is normal size, shape, consistency and nontender, ADNEXA: normal adnexa in size, nontender and no masses WET MOUNT done - results: negative for pathogens, normal epithelial cells Urinalysis negative  Chaperone: Aurora Mask (DNP student) present during the examination and performed the pelvic exam with me in attendance to confirm the exam findings.  Caryn Bee also present during the examination  ASSESSMENT:  64 y.o. 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  No evidence of UTI or vaginitis from infectious cause. We had a discussion about the natural progression of dyspareunia symptoms due to vulvovaginal atrophy and the low estrogen levels of post menopause.  She is not a candidate for topical (vaginal) or systemic estrogens given her breast cancer history and concern for recurrence risk if using hormones.  We discussed trying other lubricants including silicone-based ones, and also factors including encouraging partner to engage in intercourse very gently.  Other options that we discussed include referral for pelvic floor physical therapy, she prefers just to try the lubricants for now.  She is very reassured by today's examination and our recommendations because she was mostly concerned about there being something very wrong that has been causing this problem of dyspareunia, and  she is reassured that this does not appear to be the case based on her exam.  Follow-up as needed.  Joseph Pierini MD 01/24/20
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
## 2020-02-20 ENCOUNTER — Telehealth: Payer: Self-pay

## 2020-02-20 NOTE — Telephone Encounter (Signed)
This is my second attmept to speak with Angela [name]. I left vm for Angela [name] to call me today.  Her insurance will allow her to get prolia administered here once but then we must order from [facility] and she must self administer.

## 2020-02-20 NOTE — Telephone Encounter (Signed)
I spoke with Ms Griffing and explained the situation with prolia self injection.  She states she knows several nurses she feels will be willing to give her the injection.

## 2020-02-21 ENCOUNTER — Inpatient Hospital Stay: Payer: Managed Care, Other (non HMO) | Attending: Hematology

## 2020-02-21 ENCOUNTER — Telehealth: Payer: Self-pay | Admitting: Hematology

## 2020-02-21 ENCOUNTER — Inpatient Hospital Stay (HOSPITAL_BASED_OUTPATIENT_CLINIC_OR_DEPARTMENT_OTHER): Payer: Managed Care, Other (non HMO) | Admitting: Hematology

## 2020-02-21 ENCOUNTER — Inpatient Hospital Stay: Payer: Managed Care, Other (non HMO)

## 2020-02-21 ENCOUNTER — Encounter: Payer: Self-pay | Admitting: Hematology

## 2020-02-21 ENCOUNTER — Other Ambulatory Visit: Payer: Self-pay

## 2020-02-21 VITALS — BP 123/76 | HR 93 | Temp 97.5°F | Resp 18 | Ht 64.0 in | Wt 131.3 lb

## 2020-02-21 DIAGNOSIS — C50412 Malignant neoplasm of upper-outer quadrant of left female breast: Secondary | ICD-10-CM | POA: Diagnosis not present

## 2020-02-21 DIAGNOSIS — Z170 Estrogen receptor positive status [ER+]: Secondary | ICD-10-CM | POA: Diagnosis not present

## 2020-02-21 DIAGNOSIS — M816 Localized osteoporosis [Lequesne]: Secondary | ICD-10-CM | POA: Diagnosis not present

## 2020-02-21 DIAGNOSIS — Z95828 Presence of other vascular implants and grafts: Secondary | ICD-10-CM

## 2020-02-21 DIAGNOSIS — M810 Age-related osteoporosis without current pathological fracture: Secondary | ICD-10-CM | POA: Diagnosis not present

## 2020-02-21 DIAGNOSIS — E538 Deficiency of other specified B group vitamins: Secondary | ICD-10-CM

## 2020-02-21 DIAGNOSIS — E2839 Other primary ovarian failure: Secondary | ICD-10-CM | POA: Diagnosis not present

## 2020-02-21 DIAGNOSIS — C773 Secondary and unspecified malignant neoplasm of axilla and upper limb lymph nodes: Secondary | ICD-10-CM | POA: Insufficient documentation

## 2020-02-21 LAB — COMPREHENSIVE METABOLIC PANEL
ALT: <6 U/L (ref 0–44)
AST: 15 U/L (ref 15–41)
Albumin: 3.9 g/dL (ref 3.5–5.0)
Alkaline Phosphatase: 58 U/L (ref 38–126)
Anion gap: 5 (ref 5–15)
BUN: 11 mg/dL (ref 8–23)
CO2: 26 mmol/L (ref 22–32)
Calcium: 9.3 mg/dL (ref 8.9–10.3)
Chloride: 109 mmol/L (ref 98–111)
Creatinine, Ser: 0.72 mg/dL (ref 0.44–1.00)
GFR calc Af Amer: >60 mL/min (ref >60)
GFR calc non Af Amer: >60 mL/min (ref >60)
Glucose, Bld: 113 mg/dL — ABNORMAL HIGH (ref 70–99)
Potassium: 3.7 mmol/L (ref 3.5–5.1)
Sodium: 140 mmol/L (ref 135–145)
Total Bilirubin: 0.6 mg/dL (ref 0.3–1.2)
Total Protein: 6.8 g/dL (ref 6.5–8.1)

## 2020-02-21 LAB — CBC WITH DIFFERENTIAL/PLATELET
Abs Immature Granulocytes: 0.01 10*3/uL (ref 0.00–0.07)
Basophils Absolute: 0 10*3/uL (ref 0.0–0.1)
Basophils Relative: 0 %
Eosinophils Absolute: 0.1 10*3/uL (ref 0.0–0.5)
Eosinophils Relative: 2 %
HCT: 35.4 % — ABNORMAL LOW (ref 36.0–46.0)
Hemoglobin: 12.1 g/dL (ref 12.0–15.0)
Immature Granulocytes: 0 %
Lymphocytes Relative: 28 %
Lymphs Abs: 1.6 10*3/uL (ref 0.7–4.0)
MCH: 29.9 pg (ref 26.0–34.0)
MCHC: 34.2 g/dL (ref 30.0–36.0)
MCV: 87.4 fL (ref 80.0–100.0)
Monocytes Absolute: 0.6 10*3/uL (ref 0.1–1.0)
Monocytes Relative: 10 %
Neutro Abs: 3.4 10*3/uL (ref 1.7–7.7)
Neutrophils Relative %: 60 %
Platelets: 232 10*3/uL (ref 150–400)
RBC: 4.05 MIL/uL (ref 3.87–5.11)
RDW: 12.2 % (ref 11.5–15.5)
WBC: 5.7 10*3/uL (ref 4.0–10.5)
nRBC: 0 % (ref 0.0–0.2)

## 2020-02-21 LAB — VITAMIN B12: Vitamin B-12: 1946 pg/mL — ABNORMAL HIGH (ref 180–914)

## 2020-02-21 LAB — VITAMIN D 25 HYDROXY (VIT D DEFICIENCY, FRACTURES): Vit D, 25-Hydroxy: 72.35 ng/mL (ref 30–100)

## 2020-02-21 MED ORDER — DENOSUMAB 60 MG/ML ~~LOC~~ SOSY
60.0000 mg | PREFILLED_SYRINGE | Freq: Once | SUBCUTANEOUS | Status: AC
  Administered 2020-02-21: 60 mg via SUBCUTANEOUS

## 2020-02-21 MED ORDER — DENOSUMAB 60 MG/ML ~~LOC~~ SOSY
PREFILLED_SYRINGE | SUBCUTANEOUS | Status: AC
  Filled 2020-02-21: qty 1

## 2020-02-21 NOTE — Telephone Encounter (Signed)
Scheduled per 08/25 los, patient has received updated calender.

## 2020-02-21 NOTE — Patient Instructions (Signed)
Denosumab injection What is this medicine? DENOSUMAB (den oh sue mab) slows bone breakdown. Prolia is used to treat osteoporosis in women after menopause and in men, and in people who are taking corticosteroids for 6 months or more. Xgeva is used to treat a high calcium level due to cancer and to prevent bone fractures and other bone problems caused by multiple myeloma or cancer bone metastases. Xgeva is also used to treat giant cell tumor of the bone. This medicine may be used for other purposes; ask your health care provider or pharmacist if you have questions. COMMON BRAND NAME(S): Prolia, XGEVA What should I tell my health care provider before I take this medicine? They need to know if you have any of these conditions:  dental disease  having surgery or tooth extraction  infection  kidney disease  low levels of calcium or Vitamin D in the blood  malnutrition  on hemodialysis  skin conditions or sensitivity  thyroid or parathyroid disease  an unusual reaction to denosumab, other medicines, foods, dyes, or preservatives  pregnant or trying to get pregnant  breast-feeding How should I use this medicine? This medicine is for injection under the skin. It is given by a health care professional in a hospital or clinic setting. A special MedGuide will be given to you before each treatment. Be sure to read this information carefully each time. For Prolia, talk to your pediatrician regarding the use of this medicine in children. Special care may be needed. For Xgeva, talk to your pediatrician regarding the use of this medicine in children. While this drug may be prescribed for children as young as 13 years for selected conditions, precautions do apply. Overdosage: If you think you have taken too much of this medicine contact a poison control center or emergency room at once. NOTE: This medicine is only for you. Do not share this medicine with others. What if I miss a dose? It is  important not to miss your dose. Call your doctor or health care professional if you are unable to keep an appointment. What may interact with this medicine? Do not take this medicine with any of the following medications:  other medicines containing denosumab This medicine may also interact with the following medications:  medicines that lower your chance of fighting infection  steroid medicines like prednisone or cortisone This list may not describe all possible interactions. Give your health care provider a list of all the medicines, herbs, non-prescription drugs, or dietary supplements you use. Also tell them if you smoke, drink alcohol, or use illegal drugs. Some items may interact with your medicine. What should I watch for while using this medicine? Visit your doctor or health care professional for regular checks on your progress. Your doctor or health care professional may order blood tests and other tests to see how you are doing. Call your doctor or health care professional for advice if you get a fever, chills or sore throat, or other symptoms of a cold or flu. Do not treat yourself. This drug may decrease your body's ability to fight infection. Try to avoid being around people who are sick. You should make sure you get enough calcium and vitamin D while you are taking this medicine, unless your doctor tells you not to. Discuss the foods you eat and the vitamins you take with your health care professional. See your dentist regularly. Brush and floss your teeth as directed. Before you have any dental work done, tell your dentist you are   receiving this medicine. Do not become pregnant while taking this medicine or for 5 months after stopping it. Talk with your doctor or health care professional about your birth control options while taking this medicine. Women should inform their doctor if they wish to become pregnant or think they might be pregnant. There is a potential for serious side  effects to an unborn child. Talk to your health care professional or pharmacist for more information. What side effects may I notice from receiving this medicine? Side effects that you should report to your doctor or health care professional as soon as possible:  allergic reactions like skin rash, itching or hives, swelling of the face, lips, or tongue  bone pain  breathing problems  dizziness  jaw pain, especially after dental work  redness, blistering, peeling of the skin  signs and symptoms of infection like fever or chills; cough; sore throat; pain or trouble passing urine  signs of low calcium like fast heartbeat, muscle cramps or muscle pain; pain, tingling, numbness in the hands or feet; seizures  unusual bleeding or bruising  unusually weak or tired Side effects that usually do not require medical attention (report to your doctor or health care professional if they continue or are bothersome):  constipation  diarrhea  headache  joint pain  loss of appetite  muscle pain  runny nose  tiredness  upset stomach This list may not describe all possible side effects. Call your doctor for medical advice about side effects. You may report side effects to FDA at 1-800-FDA-1088. Where should I keep my medicine? This medicine is only given in a clinic, doctor's office, or other health care setting and will not be stored at home. NOTE: This sheet is a summary. It may not cover all possible information. If you have questions about this medicine, talk to your doctor, pharmacist, or health care provider.  2020 Elsevier/Gold Standard (2017-10-22 16:10:44)

## 2020-03-14 ENCOUNTER — Other Ambulatory Visit: Payer: Self-pay | Admitting: Hematology

## 2020-03-14 ENCOUNTER — Telehealth: Payer: Self-pay

## 2020-03-14 NOTE — Telephone Encounter (Signed)
Per dr. Burr Medico patient should no longer be taking vitamin d2 1.25 mg weekly.  She should be taking 1000-2000 mcg daily.  I left a vm for Angela [name] to call.

## 2020-06-19 ENCOUNTER — Other Ambulatory Visit: Payer: Self-pay | Admitting: Hematology

## 2020-07-03 ENCOUNTER — Other Ambulatory Visit: Payer: Self-pay

## 2020-07-03 ENCOUNTER — Ambulatory Visit
Admission: RE | Admit: 2020-07-03 | Discharge: 2020-07-03 | Disposition: A | Discharge status: Home / Self Care | Payer: Managed Care, Other (non HMO) | Source: Ambulatory Visit | Attending: Hematology | Admitting: Hematology

## 2020-07-03 DIAGNOSIS — C50412 Malignant neoplasm of upper-outer quadrant of left female breast: Secondary | ICD-10-CM

## 2020-08-19 ENCOUNTER — Telehealth: Payer: Self-pay
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
## 2020-08-19 NOTE — Telephone Encounter (Signed)
I have left multiple vm last week and today requesting Angela [name] call me back regarding the ordering of her prolia.  As this am she has not returned my calls.  I will not order prolia until I confirm with her.

## 2020-08-21 ENCOUNTER — Inpatient Hospital Stay: Payer: Managed Care, Other (non HMO) | Admitting: Hematology

## 2020-08-21 ENCOUNTER — Inpatient Hospital Stay: Payer: Managed Care, Other (non HMO) | Attending: Hematology

## 2020-08-21 DIAGNOSIS — Z170 Estrogen receptor positive status [ER+]: Secondary | ICD-10-CM

## 2020-08-28 ENCOUNTER — Telehealth: Payer: Self-pay | Admitting: Hematology

## 2020-08-28 NOTE — Telephone Encounter (Signed)
Scheduled appointments per 03/01 schedule messages. Attempted to contact patient, left a detailed message about appointments.
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
## 2020-09-27 ENCOUNTER — Inpatient Hospital Stay: Payer: Self-pay | Admitting: Hematology

## 2020-09-27 ENCOUNTER — Inpatient Hospital Stay: Payer: Self-pay | Attending: Hematology

## 2020-09-27 DIAGNOSIS — M816 Localized osteoporosis [Lequesne]: Secondary | ICD-10-CM

## 2020-09-27 DIAGNOSIS — C50412 Malignant neoplasm of upper-outer quadrant of left female breast: Secondary | ICD-10-CM

## 2020-10-01 ENCOUNTER — Telehealth: Payer: Self-pay

## 2020-10-01 NOTE — Telephone Encounter (Signed)
error 

## 2020-12-27 ENCOUNTER — Encounter: Payer: Self-pay | Admitting: Hematology

## 2021-01-24 ENCOUNTER — Ambulatory Visit: Payer: Medicare Other | Admitting: Nurse Practitioner

## 2021-01-24 ENCOUNTER — Encounter: Payer: Medicare Other | Admitting: Obstetrics and Gynecology

## 2021-09-13 ENCOUNTER — Other Ambulatory Visit: Payer: Self-pay | Admitting: Hematology

## 2021-09-13 DIAGNOSIS — C50412 Malignant neoplasm of upper-outer quadrant of left female breast: Secondary | ICD-10-CM

## 2021-09-15 ENCOUNTER — Other Ambulatory Visit: Payer: Self-pay

## 2021-09-15 DIAGNOSIS — Z170 Estrogen receptor positive status [ER+]: Secondary | ICD-10-CM

## 2022-09-16 ENCOUNTER — Other Ambulatory Visit: Payer: Self-pay | Admitting: Hematology

## 2022-09-16 DIAGNOSIS — Z170 Estrogen receptor positive status [ER+]: Secondary | ICD-10-CM
